# Patient Record
Sex: Male | Born: 1935 | Race: White | Hispanic: No | Smoking: Former smoker
Health system: Southern US, Community
[De-identification: ages and names within clinical notes are randomized; demographics above are authoritative.]

## PROBLEM LIST (undated history)

## (undated) DIAGNOSIS — M810 Age-related osteoporosis without current pathological fracture: Secondary | ICD-10-CM

## (undated) DIAGNOSIS — C801 Malignant (primary) neoplasm, unspecified: Secondary | ICD-10-CM

## (undated) DIAGNOSIS — E119 Type 2 diabetes mellitus without complications: Secondary | ICD-10-CM

## (undated) DIAGNOSIS — N189 Chronic kidney disease, unspecified: Secondary | ICD-10-CM

## (undated) DIAGNOSIS — Z9981 Dependence on supplemental oxygen: Secondary | ICD-10-CM

## (undated) DIAGNOSIS — K219 Gastro-esophageal reflux disease without esophagitis: Secondary | ICD-10-CM

## (undated) DIAGNOSIS — I10 Essential (primary) hypertension: Secondary | ICD-10-CM

## (undated) DIAGNOSIS — J189 Pneumonia, unspecified organism: Secondary | ICD-10-CM

## (undated) DIAGNOSIS — E7800 Pure hypercholesterolemia, unspecified: Secondary | ICD-10-CM

## (undated) DIAGNOSIS — R32 Unspecified urinary incontinence: Secondary | ICD-10-CM

## (undated) DIAGNOSIS — J449 Chronic obstructive pulmonary disease, unspecified: Secondary | ICD-10-CM

## (undated) DIAGNOSIS — I480 Paroxysmal atrial fibrillation: Secondary | ICD-10-CM

## (undated) DIAGNOSIS — K589 Irritable bowel syndrome without diarrhea: Secondary | ICD-10-CM

## (undated) DIAGNOSIS — R011 Cardiac murmur, unspecified: Secondary | ICD-10-CM

## (undated) HISTORY — DX: Age-related osteoporosis without current pathological fracture: M81.0

## (undated) HISTORY — DX: Pure hypercholesterolemia, unspecified: E78.00

## (undated) HISTORY — DX: Type 2 diabetes mellitus without complications: E11.9

## (undated) HISTORY — DX: Malignant (primary) neoplasm, unspecified: C80.1

## (undated) HISTORY — DX: Chronic obstructive pulmonary disease, unspecified: J44.9

## (undated) HISTORY — DX: Irritable bowel syndrome, unspecified: K58.9

## (undated) HISTORY — DX: Cardiac murmur, unspecified: R01.1

## (undated) HISTORY — DX: Unspecified urinary incontinence: R32

## (undated) HISTORY — PX: OTHER SURGICAL HISTORY: SHX169

---

## 1992-06-26 DIAGNOSIS — C801 Malignant (primary) neoplasm, unspecified: Secondary | ICD-10-CM

## 1992-06-26 HISTORY — DX: Malignant (primary) neoplasm, unspecified: C80.1

## 1992-06-26 HISTORY — PX: PROSTATE SURGERY: SHX751

## 1993-06-26 HISTORY — PX: OTHER SURGICAL HISTORY: SHX169

## 2004-01-11 ENCOUNTER — Other Ambulatory Visit: Payer: Self-pay

## 2004-03-26 ENCOUNTER — Ambulatory Visit: Payer: Self-pay | Admitting: Internal Medicine

## 2004-04-26 ENCOUNTER — Ambulatory Visit: Payer: Self-pay | Admitting: Internal Medicine

## 2004-05-26 ENCOUNTER — Ambulatory Visit: Payer: Self-pay | Admitting: Internal Medicine

## 2004-08-03 ENCOUNTER — Ambulatory Visit: Payer: Self-pay | Admitting: Internal Medicine

## 2004-08-24 ENCOUNTER — Ambulatory Visit: Payer: Self-pay | Admitting: Internal Medicine

## 2004-09-24 ENCOUNTER — Ambulatory Visit: Payer: Self-pay | Admitting: Internal Medicine

## 2004-11-30 ENCOUNTER — Ambulatory Visit: Payer: Self-pay | Admitting: Internal Medicine

## 2004-12-24 ENCOUNTER — Ambulatory Visit: Payer: Self-pay | Admitting: Internal Medicine

## 2005-01-24 ENCOUNTER — Ambulatory Visit: Payer: Self-pay | Admitting: Internal Medicine

## 2005-03-28 ENCOUNTER — Ambulatory Visit: Payer: Self-pay | Admitting: Internal Medicine

## 2005-04-26 ENCOUNTER — Ambulatory Visit: Payer: Self-pay | Admitting: Internal Medicine

## 2005-05-26 ENCOUNTER — Ambulatory Visit: Payer: Self-pay | Admitting: Internal Medicine

## 2005-07-20 ENCOUNTER — Ambulatory Visit: Payer: Self-pay | Admitting: Internal Medicine

## 2005-07-27 ENCOUNTER — Ambulatory Visit: Payer: Self-pay | Admitting: Internal Medicine

## 2005-08-24 ENCOUNTER — Ambulatory Visit: Payer: Self-pay | Admitting: Internal Medicine

## 2005-11-09 ENCOUNTER — Ambulatory Visit: Payer: Self-pay | Admitting: Internal Medicine

## 2005-11-24 ENCOUNTER — Ambulatory Visit: Payer: Self-pay | Admitting: Internal Medicine

## 2005-12-24 ENCOUNTER — Ambulatory Visit: Payer: Self-pay | Admitting: Internal Medicine

## 2006-01-24 ENCOUNTER — Ambulatory Visit: Payer: Self-pay | Admitting: Internal Medicine

## 2006-04-19 ENCOUNTER — Ambulatory Visit: Payer: Self-pay | Admitting: Internal Medicine

## 2006-04-26 ENCOUNTER — Ambulatory Visit: Payer: Self-pay | Admitting: Internal Medicine

## 2006-07-12 ENCOUNTER — Ambulatory Visit: Payer: Self-pay | Admitting: Internal Medicine

## 2006-07-27 ENCOUNTER — Ambulatory Visit: Payer: Self-pay | Admitting: Internal Medicine

## 2006-08-25 ENCOUNTER — Ambulatory Visit: Payer: Self-pay | Admitting: Internal Medicine

## 2006-10-23 ENCOUNTER — Ambulatory Visit: Payer: Self-pay | Admitting: General Surgery

## 2006-11-25 ENCOUNTER — Ambulatory Visit: Payer: Self-pay | Admitting: Internal Medicine

## 2006-12-07 ENCOUNTER — Ambulatory Visit: Payer: Self-pay | Admitting: Internal Medicine

## 2006-12-21 ENCOUNTER — Ambulatory Visit: Payer: Self-pay | Admitting: General Surgery

## 2006-12-25 ENCOUNTER — Ambulatory Visit: Payer: Self-pay | Admitting: Internal Medicine

## 2007-01-02 ENCOUNTER — Other Ambulatory Visit: Payer: Self-pay

## 2007-01-02 ENCOUNTER — Ambulatory Visit: Payer: Self-pay | Admitting: General Surgery

## 2007-01-08 ENCOUNTER — Ambulatory Visit: Payer: Self-pay | Admitting: General Surgery

## 2007-03-27 ENCOUNTER — Ambulatory Visit: Payer: Self-pay | Admitting: Internal Medicine

## 2007-04-05 ENCOUNTER — Ambulatory Visit: Payer: Self-pay | Admitting: Internal Medicine

## 2007-04-27 ENCOUNTER — Ambulatory Visit: Payer: Self-pay | Admitting: Internal Medicine

## 2007-05-10 ENCOUNTER — Ambulatory Visit: Payer: Self-pay | Admitting: Family Medicine

## 2007-07-28 ENCOUNTER — Ambulatory Visit: Payer: Self-pay | Admitting: Internal Medicine

## 2007-08-09 ENCOUNTER — Ambulatory Visit: Payer: Self-pay | Admitting: Internal Medicine

## 2007-08-25 ENCOUNTER — Ambulatory Visit: Payer: Self-pay | Admitting: Internal Medicine

## 2007-11-25 ENCOUNTER — Ambulatory Visit: Payer: Self-pay | Admitting: Internal Medicine

## 2007-12-06 ENCOUNTER — Ambulatory Visit: Payer: Self-pay | Admitting: Internal Medicine

## 2007-12-25 ENCOUNTER — Ambulatory Visit: Payer: Self-pay | Admitting: Internal Medicine

## 2008-03-26 ENCOUNTER — Ambulatory Visit: Payer: Self-pay | Admitting: Internal Medicine

## 2008-03-27 ENCOUNTER — Ambulatory Visit: Payer: Self-pay | Admitting: Internal Medicine

## 2008-04-26 ENCOUNTER — Ambulatory Visit: Payer: Self-pay | Admitting: Internal Medicine

## 2008-07-17 ENCOUNTER — Ambulatory Visit: Payer: Self-pay | Admitting: Internal Medicine

## 2008-07-27 ENCOUNTER — Ambulatory Visit: Payer: Self-pay | Admitting: Internal Medicine

## 2008-10-24 ENCOUNTER — Ambulatory Visit: Payer: Self-pay | Admitting: Internal Medicine

## 2008-11-05 ENCOUNTER — Ambulatory Visit: Payer: Self-pay | Admitting: Internal Medicine

## 2008-11-24 ENCOUNTER — Ambulatory Visit: Payer: Self-pay | Admitting: Internal Medicine

## 2008-12-31 ENCOUNTER — Ambulatory Visit: Payer: Self-pay | Admitting: Family Medicine

## 2009-01-29 ENCOUNTER — Ambulatory Visit: Payer: Self-pay

## 2009-02-24 ENCOUNTER — Ambulatory Visit: Payer: Self-pay | Admitting: Internal Medicine

## 2009-03-11 ENCOUNTER — Ambulatory Visit: Payer: Self-pay | Admitting: Internal Medicine

## 2009-03-26 ENCOUNTER — Ambulatory Visit: Payer: Self-pay | Admitting: Internal Medicine

## 2009-06-26 ENCOUNTER — Ambulatory Visit: Payer: Self-pay | Admitting: Internal Medicine

## 2009-06-26 HISTORY — PX: HERNIA REPAIR: SHX51

## 2009-06-26 HISTORY — PX: COLONOSCOPY: SHX174

## 2009-07-01 ENCOUNTER — Ambulatory Visit: Payer: Self-pay | Admitting: Internal Medicine

## 2009-07-27 ENCOUNTER — Ambulatory Visit: Payer: Self-pay | Admitting: Internal Medicine

## 2009-09-24 ENCOUNTER — Ambulatory Visit: Payer: Self-pay | Admitting: Internal Medicine

## 2009-10-21 ENCOUNTER — Ambulatory Visit: Payer: Self-pay | Admitting: Internal Medicine

## 2009-10-24 ENCOUNTER — Ambulatory Visit: Payer: Self-pay | Admitting: Internal Medicine

## 2009-11-26 ENCOUNTER — Ambulatory Visit: Payer: Self-pay | Admitting: General Surgery

## 2010-01-24 ENCOUNTER — Ambulatory Visit: Payer: Self-pay | Admitting: Internal Medicine

## 2010-02-17 ENCOUNTER — Ambulatory Visit: Payer: Self-pay | Admitting: Internal Medicine

## 2010-02-24 ENCOUNTER — Ambulatory Visit: Payer: Self-pay | Admitting: Internal Medicine

## 2010-03-18 ENCOUNTER — Ambulatory Visit: Payer: Self-pay | Admitting: Family Medicine

## 2010-06-09 ENCOUNTER — Ambulatory Visit: Payer: Self-pay | Admitting: Internal Medicine

## 2010-06-26 ENCOUNTER — Ambulatory Visit: Payer: Self-pay | Admitting: Internal Medicine

## 2010-08-10 ENCOUNTER — Ambulatory Visit: Payer: Self-pay | Admitting: Internal Medicine

## 2010-08-25 ENCOUNTER — Ambulatory Visit: Payer: Self-pay | Admitting: Internal Medicine

## 2010-10-10 ENCOUNTER — Ambulatory Visit: Payer: Self-pay | Admitting: Internal Medicine

## 2010-10-11 LAB — PSA

## 2010-10-25 ENCOUNTER — Ambulatory Visit: Payer: Self-pay | Admitting: Internal Medicine

## 2010-12-09 ENCOUNTER — Ambulatory Visit: Payer: Self-pay | Admitting: Internal Medicine

## 2010-12-25 ENCOUNTER — Ambulatory Visit: Payer: Self-pay | Admitting: Internal Medicine

## 2011-02-08 ENCOUNTER — Ambulatory Visit: Payer: Self-pay | Admitting: Internal Medicine

## 2011-02-25 ENCOUNTER — Ambulatory Visit: Payer: Self-pay | Admitting: Internal Medicine

## 2011-04-12 ENCOUNTER — Ambulatory Visit: Payer: Self-pay | Admitting: Internal Medicine

## 2011-04-13 LAB — PSA: PSA: <0.1 ng/mL (ref 0.0–4.0)

## 2011-04-27 ENCOUNTER — Ambulatory Visit: Payer: Self-pay | Admitting: Internal Medicine

## 2011-06-12 ENCOUNTER — Ambulatory Visit: Payer: Self-pay | Admitting: Internal Medicine

## 2011-06-13 LAB — PSA: PSA: <0.1 ng/mL (ref 0.0–4.0)

## 2011-06-27 ENCOUNTER — Ambulatory Visit: Payer: Self-pay | Admitting: Internal Medicine

## 2011-07-28 ENCOUNTER — Ambulatory Visit: Payer: Self-pay | Admitting: Internal Medicine

## 2011-08-11 ENCOUNTER — Ambulatory Visit: Payer: Self-pay | Admitting: Family Medicine

## 2011-10-09 ENCOUNTER — Ambulatory Visit: Payer: Self-pay | Admitting: Internal Medicine

## 2011-10-25 ENCOUNTER — Ambulatory Visit: Payer: Self-pay | Admitting: Internal Medicine

## 2011-12-11 ENCOUNTER — Ambulatory Visit: Payer: Self-pay | Admitting: Internal Medicine

## 2011-12-25 ENCOUNTER — Ambulatory Visit: Payer: Self-pay | Admitting: Internal Medicine

## 2012-01-25 ENCOUNTER — Ambulatory Visit: Payer: Self-pay

## 2012-01-25 ENCOUNTER — Ambulatory Visit: Payer: Self-pay | Admitting: Internal Medicine

## 2012-02-06 LAB — PSA: PSA: 0.6 ng/mL (ref 0.0–4.0)

## 2012-02-25 ENCOUNTER — Ambulatory Visit: Payer: Self-pay | Admitting: Internal Medicine

## 2012-03-07 LAB — PSA: PSA: 0.7 ng/mL (ref 0.0–4.0)

## 2012-03-26 ENCOUNTER — Ambulatory Visit: Payer: Self-pay | Admitting: Internal Medicine

## 2012-04-26 ENCOUNTER — Ambulatory Visit: Payer: Self-pay | Admitting: Internal Medicine

## 2012-05-26 ENCOUNTER — Ambulatory Visit: Payer: Self-pay | Admitting: Internal Medicine

## 2012-06-26 ENCOUNTER — Ambulatory Visit: Payer: Self-pay | Admitting: Internal Medicine

## 2012-07-01 LAB — HEPATIC FUNCTION PANEL A (ARMC)
Bilirubin, Direct: 0.1 mg/dL (ref 0.00–0.20)
Bilirubin,Total: 0.5 mg/dL (ref 0.2–1.0)
SGOT(AST): 19 U/L (ref 15–37)
SGPT (ALT): 20 U/L (ref 12–78)

## 2012-07-01 LAB — CBC CANCER CENTER
Basophil #: 0.1 x10 3/mm (ref 0.0–0.1)
Eosinophil #: 0.2 x10 3/mm (ref 0.0–0.7)
Eosinophil %: 2.5 %
HCT: 47 % (ref 40.0–52.0)
HGB: 15.6 g/dL (ref 13.0–18.0)
Lymphocyte #: 1 x10 3/mm (ref 1.0–3.6)
MCH: 29.7 pg (ref 26.0–34.0)
Monocyte #: 0.6 x10 3/mm (ref 0.2–1.0)
Neutrophil %: 70.4 %
RBC: 5.28 10*6/uL (ref 4.40–5.90)
RDW: 15.1 % — ABNORMAL HIGH (ref 11.5–14.5)
WBC: 6.2 x10 3/mm (ref 3.8–10.6)

## 2012-07-01 LAB — CREATININE, SERUM: EGFR (Non-African Amer.): 54 — ABNORMAL LOW

## 2012-07-02 LAB — PSA: PSA: 1.6 ng/mL (ref 0.0–4.0)

## 2012-07-27 ENCOUNTER — Ambulatory Visit: Payer: Self-pay | Admitting: Internal Medicine

## 2012-08-22 ENCOUNTER — Ambulatory Visit: Payer: Self-pay | Admitting: Family Medicine

## 2012-08-24 ENCOUNTER — Ambulatory Visit: Payer: Self-pay | Admitting: Internal Medicine

## 2012-09-24 ENCOUNTER — Ambulatory Visit: Payer: Self-pay | Admitting: Internal Medicine

## 2012-10-24 ENCOUNTER — Ambulatory Visit: Payer: Self-pay | Admitting: Internal Medicine

## 2012-10-28 ENCOUNTER — Encounter: Payer: Self-pay | Admitting: *Deleted

## 2012-11-24 ENCOUNTER — Ambulatory Visit: Payer: Self-pay | Admitting: Internal Medicine

## 2012-12-23 ENCOUNTER — Ambulatory Visit: Payer: Self-pay | Admitting: General Surgery

## 2013-01-08 ENCOUNTER — Encounter: Payer: Self-pay | Admitting: General Surgery

## 2013-01-08 ENCOUNTER — Ambulatory Visit (INDEPENDENT_AMBULATORY_CARE_PROVIDER_SITE_OTHER): Payer: Medicare Other | Admitting: General Surgery

## 2013-01-08 VITALS — BP 142/70 | HR 68 | Resp 16 | Ht 72.0 in | Wt 186.0 lb

## 2013-01-08 DIAGNOSIS — Z860100 Personal history of colon polyps, unspecified: Secondary | ICD-10-CM | POA: Insufficient documentation

## 2013-01-08 NOTE — Patient Instructions (Addendum)
Colonoscopy A colonoscopy is an exam to evaluate your entire colon. In this exam, your colon is cleansed. A long fiberoptic tube is inserted through your rectum and into your colon. The fiberoptic scope (endoscope) is a long bundle of enclosed and very flexible fibers. These fibers transmit light to the area examined and send images from that area to your caregiver. Discomfort is usually minimal. You may be given a drug to help you sleep (sedative) during or prior to the procedure. This exam helps to detect lumps (tumors), polyps, inflammation, and areas of bleeding. Your caregiver may also take a small piece of tissue (biopsy) that will be examined under a microscope. LET YOUR CAREGIVER KNOW ABOUT:   Allergies to food or medicine.  Medicines taken, including vitamins, herbs, eyedrops, over-the-counter medicines, and creams.  Use of steroids (by mouth or creams).  Previous problems with anesthetics or numbing medicines.  History of bleeding problems or blood clots.  Previous surgery.  Other health problems, including diabetes and kidney problems.  Possibility of pregnancy, if this applies. BEFORE THE PROCEDURE   A clear liquid diet may be required for 2 days before the exam.  Ask your caregiver about changing or stopping your regular medications.  Liquid injections (enemas) or laxatives may be required.  A large amount of electrolyte solution may be given to you to drink over a short period of time. This solution is used to clean out your colon.  You should be present 60 minutes prior to your procedure or as directed by your caregiver. AFTER THE PROCEDURE   If you received a sedative or pain relieving medication, you will need to arrange for someone to drive you home.  Occasionally, there is a little blood passed with the first bowel movement. Do not be concerned. FINDING OUT THE RESULTS OF YOUR TEST Not all test results are available during your visit. If your test results are  not back during the visit, make an appointment with your caregiver to find out the results. Do not assume everything is normal if you have not heard from your caregiver or the medical facility. It is important for you to follow up on all of your test results. HOME CARE INSTRUCTIONS   It is not unusual to pass moderate amounts of gas and experience mild abdominal cramping following the procedure. This is due to air being used to inflate your colon during the exam. Walking or a warm pack on your belly (abdomen) may help.  You may resume all normal meals and activities after sedatives and medicines have worn off.  Only take over-the-counter or prescription medicines for pain, discomfort, or fever as directed by your caregiver. Do not use aspirin or blood thinners if a biopsy was taken. Consult your caregiver for medicine usage if biopsies were taken. SEEK IMMEDIATE MEDICAL CARE IF:   You have a fever.  You pass large blood clots or fill a toilet with blood following the procedure. This may also occur 10 to 14 days following the procedure. This is more likely if a biopsy was taken.  You develop abdominal pain that keeps getting worse and cannot be relieved with medicine. Document Released: 06/09/2000 Document Revised: 09/04/2011 Document Reviewed: 01/23/2008 [facility] Patient Information 2014 [place], [place].  Patient will be contacted once September 2014 scheduled is available to schedule colonoscopy. This patient has been asked to hold metformin day of colonoscopy prep and procedure. He will also discontinue fish oil one week prior.

## 2013-01-08 NOTE — Progress Notes (Addendum)
Patient ID: Alan Weaver, male   DOB: [birth year], 79 y.o.   MRN: [id number]  Chief Complaint  Patient presents with  . Pre-op Exam    colonoscopy    HPI Alan Weaver is a 79 y.o. male who presents for a pre-op examination for a colonoscopy. His last colonscopy was done in 2011 in which polyps were present.  HPI  Past Medical History  Diagnosis Date  . Heart murmur   . COPD (chronic obstructive pulmonary disease)   . High blood cholesterol level   . Irritable bowel syndrome   . Cancer 1994    prostate  . Diabetes mellitus without complication   . Osteoporosis   . Incontinence   . COPD (chronic obstructive pulmonary disease) 2000    Past Surgical History  Procedure Laterality Date  . Intraocular lens insertion  2002  . Colonoscopy    . Prostate surgery  1994  . Hernia repair  2011  . Surgery for staph infection  1995  . Colonoscopy  2011    Family History  Problem Relation Age of Onset  . Cancer Father     bladder  . Other Sister     brain tumor  . Cancer Brother     colon    Social History History  Substance Use Topics  . Smoking status: Never Smoker   . Smokeless tobacco: Never Used  . Alcohol Use: No    No Known Allergies  Current Outpatient Prescriptions  Medication Sig Dispense Refill  . ALBUTEROL SULFATE HFA IN Inhale 1 puff into the lungs 4 (four) times daily.      [name] alendronate (FOSAMAX) 70 MG tablet Take 70 mg by mouth every 7 (seven) days. Take with a full glass of water on an empty stomach.      . Calcium Carbonate-Vitamin D (CALCIUM + D PO) Take 600 mg by mouth 2 (two) times daily.      . Cholecalciferol (VITAMIN D-3 PO) Take 1 tablet by mouth 2 (two) times daily.      . Fluticasone-Salmeterol (ADVAIR DISKUS) 250-50 MCG/DOSE AEPB Inhale 1 puff into the lungs 2 (two) times daily.      . metFORMIN (GLUCOPHAGE) 500 MG tablet Take 500 mg by mouth daily.      . Omega-3 Fatty Acids (FISH OIL) 1200 MG CAPS Take 1 capsule by mouth 2 (two) times  daily.      . pravastatin (PRAVACHOL) 40 MG tablet Take 40 mg by mouth daily.      [name] tiotropium (SPIRIVA) 18 MCG inhalation capsule Place 18 mcg into inhaler and inhale daily.       No current facility-administered medications for this visit.    Review of Systems Review of Systems  Constitutional: Negative.   Respiratory: Positive for shortness of breath. Negative for apnea, cough, choking, chest tightness, wheezing and stridor.   Cardiovascular: Negative.   Gastrointestinal: Positive for constipation. Negative for nausea, vomiting, abdominal pain, diarrhea, blood in stool, abdominal distention, anal bleeding and rectal pain.    Blood pressure 142/70, pulse 68, resp. rate 16, height 6' (1.829 m), weight 186 lb (84.369 kg), SpO2 95.00%.  Physical Exam Physical Exam  Constitutional: He is oriented to person, place, and time. He appears well-developed and well-nourished.  Cardiovascular: Normal rate, regular rhythm and normal heart sounds.   Pulmonary/Chest: Effort normal and breath sounds normal.  Neurological: He is alert and oriented to person, place, and time.  Skin: Skin is warm and dry.    Data Reviewed  Allergy from the 11/26/2009 colonoscopy showed tubular adenomas in the ascending colon, hepatic flexure, transverse colon and descending colon. High-grade dysplasia in any of the lesions.  Assessment    Multiple colonic polyps.    Plan    Based on his excellent health, and the finding of multiple polyps throughout the colon, repeat colonoscopy has been recommended.    Patient will be contacted once September 2014 scheduled is available to schedule colonoscopy. This patient has been asked to hold metformin day of colonoscopy prep and procedure. He will also discontinue fish oil one week prior. Since he is diabetic, we will try to accommodate patient and have colonoscopy completed on a Wednesday morning. Miralax prescription will be sent to patient's pharmacy once date is  arranged.   Alan Weaver 01/10/2013, 6:39 PM

## 2013-01-24 ENCOUNTER — Ambulatory Visit: Payer: Self-pay | Admitting: Internal Medicine

## 2013-01-27 ENCOUNTER — Telehealth: Payer: Self-pay | Admitting: *Deleted

## 2013-01-27 DIAGNOSIS — Z86010 Personal history of colonic polyps: Secondary | ICD-10-CM

## 2013-01-27 MED ORDER — POLYETHYLENE GLYCOL 3350 17 GM/SCOOP PO POWD: ORAL | Status: DC

## 2013-01-27 NOTE — Telephone Encounter (Signed)
Patient has been scheduled for a colonoscopy at [facility] on 03-12-13. This patient has been asked to hold metformin day of colonoscopy prep and procedure. Also, patient has been asked to discontinue fish oil one week prior to procedure.  Miralax prescription has been sent to patient's pharmacy.   He will be contacted prior to colonoscopy to verify no medication changes.

## 2013-02-06 LAB — PSA: PSA: <0.1 ng/mL (ref 0.0–4.0)

## 2013-02-24 ENCOUNTER — Ambulatory Visit: Payer: Self-pay | Admitting: Internal Medicine

## 2013-03-05 ENCOUNTER — Telehealth: Payer: Self-pay | Admitting: *Deleted

## 2013-03-05 NOTE — Telephone Encounter (Signed)
Patient reports he is now on Lupron injections. This will be added to medication list. He reports no other changes to medications. Patient was instructed to pre-register since he has not done so already. We will proceed with colonoscopy that is scheduled for 03-12-13 at [facility]. He will call the office if he has any other questions.

## 2013-03-09 ENCOUNTER — Other Ambulatory Visit: Payer: Self-pay | Admitting: General Surgery

## 2013-03-09 DIAGNOSIS — Z86010 Personal history of colonic polyps: Secondary | ICD-10-CM

## 2013-03-12 ENCOUNTER — Ambulatory Visit: Payer: Self-pay | Admitting: General Surgery

## 2013-03-12 DIAGNOSIS — D128 Benign neoplasm of rectum: Secondary | ICD-10-CM

## 2013-03-12 DIAGNOSIS — D129 Benign neoplasm of anus and anal canal: Secondary | ICD-10-CM

## 2013-03-12 HISTORY — PX: COLONOSCOPY: SHX174

## 2013-03-13 ENCOUNTER — Encounter: Payer: Self-pay | Admitting: General Surgery

## 2013-03-17 ENCOUNTER — Encounter: Payer: Self-pay | Admitting: General Surgery

## 2013-03-18 ENCOUNTER — Telehealth: Payer: Self-pay | Admitting: *Deleted

## 2013-03-18 NOTE — Telephone Encounter (Signed)
Notified patient as instructed, patient pleased. Discussed follow-up appointments being on an as needed basis, patient agrees

## 2013-03-18 NOTE — Telephone Encounter (Signed)
Message copied by Currie Paris on Tue Mar 18, 2013  8:07 AM ------      Message from: [place], [place] W      Created: Mon Mar 17, 2013  1:49 PM       Please notify the patient that no cancer.  Polyps were OK, no further studies unless symptoms develop.       ----- Message -----         From: Jena Gauss, CMA         Sent: 03/17/2013  10:30 AM           To: Earline Mayotte, MD                   ------

## 2013-03-26 ENCOUNTER — Ambulatory Visit: Payer: Self-pay | Admitting: Internal Medicine

## 2013-04-01 ENCOUNTER — Encounter: Payer: Self-pay | Admitting: General Surgery

## 2013-06-07 LAB — BASIC METABOLIC PANEL
Anion Gap: 7 (ref 7–16)
Co2: 30 mmol/L (ref 21–32)
Creatinine: 1.21 mg/dL (ref 0.60–1.30)
EGFR (Non-African Amer.): 55 — ABNORMAL LOW
Glucose: 221 mg/dL — ABNORMAL HIGH (ref 65–99)
Osmolality: 291 (ref 275–301)
Sodium: 140 mmol/L (ref 136–145)

## 2013-06-07 LAB — CBC
HGB: 13.7 g/dL (ref 13.0–18.0)
MCH: 28.8 pg (ref 26.0–34.0)
MCHC: 32.2 g/dL (ref 32.0–36.0)
MCV: 90 fL (ref 80–100)
RBC: 4.76 10*6/uL (ref 4.40–5.90)
WBC: 9.2 10*3/uL (ref 3.8–10.6)

## 2013-06-07 LAB — TROPONIN I: Troponin-I: 0.05 ng/mL

## 2013-06-08 ENCOUNTER — Observation Stay: Payer: Self-pay | Admitting: Internal Medicine

## 2013-06-08 LAB — TROPONIN I: Troponin-I: 0.07 ng/mL — ABNORMAL HIGH

## 2013-06-08 LAB — CK TOTAL AND CKMB (NOT AT ARMC): CK-MB: 1.4 ng/mL (ref 0.5–3.6)

## 2013-07-09 ENCOUNTER — Ambulatory Visit: Payer: Self-pay | Admitting: Internal Medicine

## 2013-07-10 LAB — PSA

## 2013-07-27 ENCOUNTER — Ambulatory Visit: Payer: Self-pay | Admitting: Internal Medicine

## 2013-10-30 ENCOUNTER — Ambulatory Visit: Payer: Self-pay | Admitting: Internal Medicine

## 2013-11-06 LAB — PSA: PSA: <0.1 ng/mL (ref 0.0–4.0)

## 2013-11-24 ENCOUNTER — Ambulatory Visit: Payer: Self-pay | Admitting: Internal Medicine

## 2014-03-09 ENCOUNTER — Ambulatory Visit: Payer: Self-pay | Admitting: Internal Medicine

## 2014-03-10 LAB — PSA: PSA: <0.1 ng/mL (ref 0.0–4.0)

## 2014-03-26 ENCOUNTER — Ambulatory Visit: Payer: Self-pay | Admitting: Internal Medicine

## 2014-07-09 ENCOUNTER — Ambulatory Visit: Payer: Self-pay | Admitting: Internal Medicine

## 2014-07-13 LAB — PSA: PSA: 0.1 ng/mL (ref 0.0–4.0)

## 2014-07-27 ENCOUNTER — Ambulatory Visit: Payer: Self-pay | Admitting: Internal Medicine

## 2014-10-09 ENCOUNTER — Ambulatory Visit
Admit: 2014-10-09 | Disposition: A | Discharge status: Home / Self Care | Payer: Self-pay | Attending: Internal Medicine | Admitting: Internal Medicine

## 2014-10-10 LAB — PSA: PSA: 0.1 ng/mL (ref 0.0–4.0)

## 2014-10-16 NOTE — Discharge Summary (Signed)
PATIENT NAME:  Alan Weaver, Alan Weaver MR#:  [id number] DATE OF BIRTH:  07/06/1926  DATE OF ADMISSION:  06/08/2013 DATE OF DISCHARGE:  06/08/2013  PRESENTING COMPLAINT: Chest discomfort.   DISCHARGE DIAGNOSES:  1.  Chest discomfort suspected due to vomiting and possible mild acute gastritis, resolved.  2.  Hypertension.  3.  Type 2 diabetes.  4.  Chronic obstructive pulmonary disease.   CODE STATUS: Full code.   MEDICATIONS:  1.  Metformin 500 mg p.o. daily.  2.  Advair Diskus 250/50 mg 1 puff b.i.d.  3.  Pravastatin 40 mg daily at bedtime.  4.  Calcium with vitamin D 1 tablet b.i.d.  5.  Alendronate 70 mg once a week on Sunday.  6.  Fish oil 1200 mg 2 capsules daily.  7.  Proventil 1 puff as needed for shortness of breath. 8.  Spiriva 18 mcg inhalation daily.  9.  Vitamin D3 1000 international units 2 tablets daily.  10. Lupron intramuscular every 4 months as directed.   DIET: Mechanical soft diet.   FOLLOWUP: With your primary care physician, Dr. Lelon Huh in 1 to 2 weeks.   LABORATORY DATA AT DISCHARGE: Troponin is 0.05, 0.07, and 0.07. Echo of the heart shows EF is 50% to 55%, mild mitral valve regurgitation and mild tricuspid regurgitation. CK total and CK-MB within normal limits. CBC within normal limits. Basic metabolic panel within normal limits except glucose of 221. Chest x-ray: Severe COPD, no active lung disease.  BRIEF SUMMARY AND HOSPIAL COURSE: Mr. Haycraft is a pleasant 81 year old Caucasian gentleman, who came to the Emergency Room after he choked on a [place] sandwich he ate in the evening as part of his meal. The patient was admitted with:  1.  Chest discomfort, which is probably from dysphagia from eating the [place] sandwich and reactive chest pain after vomiting and possible mild gastritis. His troponin was 0.07, 0.05 and 0.07. He did not have any chest pain. No known cardiac history. He remained in sinus rhythm on telemetry. We held off cardiology consultation, the  patient was asymptomatic. 2.  Dysphagia from choking on a [place] sandwich. The patient felt back to baseline after he vomited. He did not have any more foreign body sensation. GI consult was canceled. I spoke with Dr. Candace Cruise, who is okay with it. The patient tolerated soft diet prior to discharge.  3.  COPD, remained stable.  4.  Type 2 diabetes. Resumed home meds. 5.   Hospital stay otherwise remained stable.   CODE STATUS: The patient remained a full code.   TIME SPENT: 40 minutes.  ____________________________ Hart Rochester Posey Pronto, MD sap:aw D: 06/09/2013 06:42:37 ET T: 06/09/2013 07:02:38 ET JOB#: [id number]  cc: Serjio Deupree A. Posey Pronto, MD, <Dictator> Kirstie Peri. Caryn Section, MD Ilda Basset MD ELECTRONICALLY SIGNED 06/12/2013 10:56

## 2014-10-16 NOTE — H&P (Signed)
PATIENT NAME:  Alan Weaver, Alan Weaver MR#:  [id number] DATE OF BIRTH:  [birth year]  DATE OF ADMISSION:  06/08/2013  PRIMARY CARE PHYSICIAN: Dr. Lelon Huh.   ONCOLOGIST: Dr. Oliva Bustard.   REFERRING MD:  Dr. Owens Shark.   CHIEF COMPLAINT: Chest discomfort and difficulty in swallowing.   HISTORY OF PRESENT ILLNESS: The patient is an 81 year old Caucasian male with past medical history of diabetes mellitus, prostate cancer, COPD, hyperlipidemia,  and osteoporosis, came into the ER after he choked on his food. The patient is reporting that at around 6:00 p.m. he choked on his food while he was eating his supper. He try to cough it up from 6:00 p.m. to 9:00 p.m. and was not successful. The patient came to the ER and he was supposed to go to EGD, but then he drank milk and he was able to swallow liquids. His nausea and vomiting were resolved. The patient was having chest discomfort, regarding which cardiac enzymes were done. The initial troponin has revealed 0.05 and subsequent troponin was elevated at 0.07. Hospitalist team is called to admit the patient for chest discomfort and dysphagia.   During my examination, the patient is resting comfortably. Denies any chest pain or shortness of breath. He has reported that after drinking milk he started feeling better and he was able to swallow liquids without any difficulty. He did not try taking any solid foods so far. Denies any shortness of breath, abdominal pain. No similar complaints in the past. Family members daughter and granddaughter were at bedside. Denies any heart attacks in the past.  See oncology as an outpatient regarding his prostate cancer.   PAST MEDICAL HISTORY: Prostate cancer status post radiation therapy and now he is getting Lupron treatments on regular basis. Diabetes mellitus, chronic obstructive pulmonary disease, hyperlipidemia, cardiac history ,  osteoporosis.   PAST SURGICAL HISTORY: Two hernia repairs, prostatectomy.   ALLERGIES: No known  drug allergies.   PSYCHOSOCIAL HISTORY: Lives at home with pets. Quit smoking five years ago. Denies alcohol or illicit drug usage.   FAMILY HISTORY: Mother had history of osteoporosis and colon cancer.  [name]  HOME MEDICATIONS: Vitamin D3 2 tablets p.o. once daily, Spiriva 18 mcg puffs inhalation 1 capsule once daily, Proventil 2 puff inhalation as needed basis, alendronate 70 mg p.o. once a week, Advair 250/50, 1 puff inhalation 2 times a day,  metformin 500 mg once daily.   REVIEW OF SYSTEMS:  CONSTITUTIONAL: Denies any fever, fatigue.  EYES: Denies blurry vision, glaucoma.  ENT: Denies epistaxis, discharge.  RESPIRATION: Denies cough, COPD.  CARDIOVASCULAR: Chest discomfort trying to swallow. Denies any palpitations.  GASTROINTESTINAL: Vomited after he choked on the food, but denies any diarrhea, abdominal pain, hematemesis.  GENITOURINARY: No dysuria, hematuria. Has prostate cancer.  ENDOCRINE: Denies polyuria, nocturia, thyroid problems. Has history of diabetes mellitus. HEMATOLOGIC AND LYMPHATIC: Denies anemia, easy bruising, bleeding.  INTEGUMENTARY: No acne, rash, lesions.  MUSCULOSKELETAL: No joint pain in the neck and back. Denies any gout.  NEUROLOGIC: Denies vertigo, ataxia.  PSYCHIATRIC: No ADD, OCD.   PHYSICAL EXAMINATION: VITAL SIGNS: The patient is afebrile, pulse 90, respirations 18, blood pressure 151/95, pulse oximetry is 92%.  GENERAL APPEARANCE: Not in acute distress. Moderately built and obese.  HEENT: Normocephalic, atraumatic. Pupils are reactive to light accommodation. No scleral icterus. No conjunctival injection. Extraocular movements are intact. Nares are patent, moist mucous membranes. Uvula is midline. Oral cavity is intact with no visible foreign bodies.  NECK: Supple. No JVD. No thyromegaly. Range  of motion is intact. No carotid bruits.  LUNGS: Clear to auscultation bilaterally. No accessory muscle use and no anterior chest wall tenderness on palpation.   CARDIAC: S1, S2 normal. Regular rate and rhythm. No murmurs.   GASTROINTESTINAL: Soft. Bowel sounds are positive in all four quadrants. Nontender, nondistended. No hepatosplenomegaly. No masses felt.  NEUROLOGIC: Awake, alert, oriented x 3. Cranial nerves II through XII are grossly intact. Motor and sensory are intact. Reflexes are 2+.  EXTREMITIES: No edema. No cyanosis. No clubbing.  SKIN: Warm to touch. Normal turgor. No rashes. No lesions. Peripheral pulses are 2+. PSYCHIATRIC: Normal mood and affect.   LABORATORY AND IMAGING STUDIES: CK total 45, CPK-MB 1.4. Troponin 0.05, 0.07, 0.07. CBC is normal. BMP is normal except BUN is elevated at 27. GFR is 55, glucose 221. Twelve-lead EKG: Normal sinus tachycardia at 99 beats per minute, normal PR and QRS interval. Nonspecific ST-T wave changes. Chest x-ray PA and lateral shows COPD, no active lung disease, possible small hiatal hernia.   ASSESSMENT AND PLAN: An 81 year old Caucasian male presenting to the ER after he choked on food and having difficulty in swallowing associated with vomiting, will be admitted with following assessment and plan.  1. Chest discomfort, probably from dysphagia from foreign body, but rule out acute myocardial infarction. We will admit the patient to telemetry, acute coronary syndrome protocol. Cyclic cardiac biomarkers, clear liquids, provide IV fluids. We will obtain echocardiogram, cycle cardiac biomarkers. Cardiology consult is placed to Dr. Saralyn Pilar.  2. Dysphagia from foreign body.  The patient was able to swallow liquids. We will put him on clear liquid diet. Nausea and vomiting were resolved. Gastroenterology consult is placed regarding dysphagia,  3. Chronic obstructive pulmonary disease- No exacerbation. We will provide nebulizer treatments as needed basis.  4. Diabetes mellitus. The patient being n.p.o., just on clear liquids. We will  keep the patient on sliding scale insulin.  5. Prostate cancer. Follow up  with oncology as an outpatient for Lupron treatments as scheduled.  6. We will provide gastrointestinal and deep vein thrombosis prophylaxis.   The patient is FULL CODE. Daughter is the medical power of attorney. Diagnosis and plan of care was discussed in detail with the patient and daughter  at bedside. They all verbalized understanding of the plan.   TOTAL TIME SPENT ON ADMISSION: 45 minutes.    ____________________________ Nicholes Mango, MD ag:sg D: 06/08/2013 07:47:53 ET T: 06/08/2013 10:17:32 ET JOB#: [id number]  cc: Nicholes Mango, MD, <Dictator> Isaias Cowman, MD Kirstie Peri. Caryn Section, MD [place]ia Oliva Bustard, MD  Nicholes Mango MD ELECTRONICALLY SIGNED 06/18/2013 16:53

## 2014-11-22 ENCOUNTER — Emergency Department: Payer: Commercial Managed Care - HMO

## 2014-11-22 ENCOUNTER — Inpatient Hospital Stay
Admission: EM | Admit: 2014-11-22 | Discharge: 2014-11-25 | DRG: 871 | Disposition: A | Discharge status: Home / Self Care | Payer: Commercial Managed Care - HMO | Attending: Internal Medicine | Admitting: Internal Medicine

## 2014-11-22 ENCOUNTER — Encounter: Payer: Self-pay | Admitting: Emergency Medicine

## 2014-11-22 DIAGNOSIS — J189 Pneumonia, unspecified organism: Secondary | ICD-10-CM | POA: Diagnosis present

## 2014-11-22 DIAGNOSIS — Z87891 Personal history of nicotine dependence: Secondary | ICD-10-CM | POA: Diagnosis not present

## 2014-11-22 DIAGNOSIS — Z8546 Personal history of malignant neoplasm of prostate: Secondary | ICD-10-CM

## 2014-11-22 DIAGNOSIS — J9601 Acute respiratory failure with hypoxia: Secondary | ICD-10-CM | POA: Diagnosis present

## 2014-11-22 DIAGNOSIS — Z800 Family history of malignant neoplasm of digestive organs: Secondary | ICD-10-CM | POA: Diagnosis not present

## 2014-11-22 DIAGNOSIS — E119 Type 2 diabetes mellitus without complications: Secondary | ICD-10-CM | POA: Diagnosis present

## 2014-11-22 DIAGNOSIS — I10 Essential (primary) hypertension: Secondary | ICD-10-CM | POA: Diagnosis present

## 2014-11-22 DIAGNOSIS — R000 Tachycardia, unspecified: Secondary | ICD-10-CM | POA: Diagnosis present

## 2014-11-22 DIAGNOSIS — R0602 Shortness of breath: Secondary | ICD-10-CM | POA: Diagnosis present

## 2014-11-22 DIAGNOSIS — Z79899 Other long term (current) drug therapy: Secondary | ICD-10-CM

## 2014-11-22 DIAGNOSIS — K589 Irritable bowel syndrome without diarrhea: Secondary | ICD-10-CM | POA: Diagnosis present

## 2014-11-22 DIAGNOSIS — A419 Sepsis, unspecified organism: Secondary | ICD-10-CM | POA: Diagnosis present

## 2014-11-22 DIAGNOSIS — Z7951 Long term (current) use of inhaled steroids: Secondary | ICD-10-CM | POA: Diagnosis not present

## 2014-11-22 DIAGNOSIS — R011 Cardiac murmur, unspecified: Secondary | ICD-10-CM | POA: Diagnosis present

## 2014-11-22 DIAGNOSIS — M810 Age-related osteoporosis without current pathological fracture: Secondary | ICD-10-CM | POA: Diagnosis present

## 2014-11-22 DIAGNOSIS — Z9889 Other specified postprocedural states: Secondary | ICD-10-CM | POA: Diagnosis not present

## 2014-11-22 DIAGNOSIS — J441 Chronic obstructive pulmonary disease with (acute) exacerbation: Secondary | ICD-10-CM | POA: Diagnosis present

## 2014-11-22 DIAGNOSIS — Z8052 Family history of malignant neoplasm of bladder: Secondary | ICD-10-CM

## 2014-11-22 LAB — TROPONIN I: Troponin I: 0.03 ng/mL (ref <0.031)

## 2014-11-22 LAB — COMPREHENSIVE METABOLIC PANEL
ALT: 11 U/L — AB (ref 17–63)
ANION GAP: 11 (ref 5–15)
AST: 18 U/L (ref 15–41)
Albumin: 3.6 g/dL (ref 3.5–5.0)
Alkaline Phosphatase: 60 U/L (ref 38–126)
BILIRUBIN TOTAL: 0.9 mg/dL (ref 0.3–1.2)
BUN: 23 mg/dL — ABNORMAL HIGH (ref 6–20)
CO2: 27 mmol/L (ref 22–32)
Calcium: 8.9 mg/dL (ref 8.9–10.3)
Chloride: 100 mmol/L — ABNORMAL LOW (ref 101–111)
Creatinine, Ser: 1.27 mg/dL — ABNORMAL HIGH (ref 0.61–1.24)
GFR calc Af Amer: 57 mL/min — ABNORMAL LOW (ref >60)
GFR, EST NON AFRICAN AMERICAN: 49 mL/min — AB (ref >60)
GLUCOSE: 172 mg/dL — AB (ref 65–99)
Potassium: 4.3 mmol/L (ref 3.5–5.1)
SODIUM: 138 mmol/L (ref 135–145)
Total Protein: 8 g/dL (ref 6.5–8.1)

## 2014-11-22 LAB — CBC WITH DIFFERENTIAL/PLATELET
BASOS ABS: 0.1 10*3/uL (ref 0–0.1)
Basophils Relative: 0 %
Eosinophils Absolute: 0 10*3/uL (ref 0–0.7)
Eosinophils Relative: 0 %
HCT: 45.7 % (ref 40.0–52.0)
Hemoglobin: 14.8 g/dL (ref 13.0–18.0)
LYMPHS PCT: 10 %
Lymphs Abs: 1.4 10*3/uL (ref 1.0–3.6)
MCH: 29 pg (ref 26.0–34.0)
MCHC: 32.5 g/dL (ref 32.0–36.0)
MCV: 89.2 fL (ref 80.0–100.0)
MONOS PCT: 7 %
Monocytes Absolute: 0.9 10*3/uL (ref 0.2–1.0)
NEUTROS ABS: 11.3 10*3/uL — AB (ref 1.4–6.5)
NEUTROS PCT: 83 %
PLATELETS: 286 10*3/uL (ref 150–440)
RBC: 5.12 MIL/uL (ref 4.40–5.90)
RDW: 15.5 % — ABNORMAL HIGH (ref 11.5–14.5)
WBC: 13.7 10*3/uL — ABNORMAL HIGH (ref 3.8–10.6)

## 2014-11-22 LAB — BRAIN NATRIURETIC PEPTIDE: B Natriuretic Peptide: 213 pg/mL — ABNORMAL HIGH (ref 0.0–100.0)

## 2014-11-22 LAB — GLUCOSE, CAPILLARY
GLUCOSE-CAPILLARY: 354 mg/dL — AB (ref 65–99)
GLUCOSE-CAPILLARY: 283 mg/dL — AB (ref 65–99)
Glucose-Capillary: 219 mg/dL — ABNORMAL HIGH (ref 65–99)

## 2014-11-22 LAB — MAGNESIUM: Magnesium: 1.6 mg/dL — ABNORMAL LOW (ref 1.7–2.4)

## 2014-11-22 LAB — HEMOGLOBIN A1C: Hgb A1c MFr Bld: 6.4 % — ABNORMAL HIGH (ref 4.0–6.0)

## 2014-11-22 MED ORDER — DOCUSATE SODIUM 100 MG PO CAPS: 100.0000 mg | ORAL_CAPSULE | Freq: Two times a day (BID) | ORAL | Status: DC | PRN

## 2014-11-22 MED ORDER — MAGNESIUM SULFATE 2 GM/50ML IV SOLN
2.0000 g | Freq: Once | INTRAVENOUS | Status: AC
  Administered 2014-11-22: 2 g via INTRAVENOUS

## 2014-11-22 MED ORDER — INSULIN ASPART 100 UNIT/ML ~~LOC~~ SOLN
0.0000 [IU] | Freq: Three times a day (TID) | SUBCUTANEOUS | Status: DC
  Administered 2014-11-22: 9 [IU] via SUBCUTANEOUS
  Administered 2014-11-23 (×2): 3 [IU] via SUBCUTANEOUS
  Administered 2014-11-23: 2 [IU] via SUBCUTANEOUS
  Administered 2014-11-24: 3 [IU] via SUBCUTANEOUS
  Administered 2014-11-24: 1 [IU] via SUBCUTANEOUS
  Administered 2014-11-24: 2 [IU] via SUBCUTANEOUS
  Administered 2014-11-25: 1 [IU] via SUBCUTANEOUS
  Filled 2014-11-22: qty 3
  Filled 2014-11-22: qty 2
  Filled 2014-11-22: qty 3
  Filled 2014-11-22: qty 9
  Filled 2014-11-22: qty 3

## 2014-11-22 MED ORDER — PRAVASTATIN SODIUM 20 MG PO TABS
40.0000 mg | ORAL_TABLET | Freq: Every day | ORAL | Status: DC
  Administered 2014-11-22 – 2014-11-25 (×4): 40 mg via ORAL
  Filled 2014-11-22 (×4): qty 2

## 2014-11-22 MED ORDER — MOMETASONE FURO-FORMOTEROL FUM 100-5 MCG/ACT IN AERO
2.0000 | INHALATION_SPRAY | Freq: Two times a day (BID) | RESPIRATORY_TRACT | Status: DC
  Administered 2014-11-22 – 2014-11-25 (×7): 2 via RESPIRATORY_TRACT
  Filled 2014-11-22: qty 8.8

## 2014-11-22 MED ORDER — METHYLPREDNISOLONE SODIUM SUCC 125 MG IJ SOLR
60.0000 mg | Freq: Three times a day (TID) | INTRAMUSCULAR | Status: DC
  Administered 2014-11-22: 60 mg via INTRAVENOUS
  Administered 2014-11-22 – 2014-11-23 (×2): 125 mg via INTRAVENOUS
  Filled 2014-11-22 (×3): qty 2

## 2014-11-22 MED ORDER — CALCIUM CITRATE-VITAMIN D 315-200 MG-UNIT PO TABS: 1.0000 | ORAL_TABLET | Freq: Two times a day (BID) | ORAL | Status: DC

## 2014-11-22 MED ORDER — LEVOFLOXACIN IN D5W 750 MG/150ML IV SOLN
750.0000 mg | Freq: Once | INTRAVENOUS | Status: AC
  Administered 2014-11-22: 750 mg via INTRAVENOUS

## 2014-11-22 MED ORDER — HEPARIN SODIUM (PORCINE) 5000 UNIT/ML IJ SOLN
5000.0000 [IU] | Freq: Three times a day (TID) | INTRAMUSCULAR | Status: DC
  Administered 2014-11-22 – 2014-11-25 (×9): 5000 [IU] via SUBCUTANEOUS
  Filled 2014-11-22 (×9): qty 1

## 2014-11-22 MED ORDER — INSULIN ASPART 100 UNIT/ML ~~LOC~~ SOLN
0.0000 [IU] | Freq: Every day | SUBCUTANEOUS | Status: DC
  Administered 2014-11-22: 100 [IU] via SUBCUTANEOUS
  Administered 2014-11-23: 2 [IU] via SUBCUTANEOUS
  Filled 2014-11-22: qty 3
  Filled 2014-11-22: qty 2
  Filled 2014-11-22 (×2): qty 1
  Filled 2014-11-22: qty 2

## 2014-11-22 MED ORDER — TIOTROPIUM BROMIDE MONOHYDRATE 18 MCG IN CAPS
18.0000 ug | ORAL_CAPSULE | Freq: Every day | RESPIRATORY_TRACT | Status: DC
  Administered 2014-11-22 – 2014-11-25 (×4): 18 ug via RESPIRATORY_TRACT
  Filled 2014-11-22: qty 5

## 2014-11-22 MED ORDER — ALENDRONATE SODIUM 70 MG PO TABS: 70.0000 mg | ORAL_TABLET | ORAL | Status: DC

## 2014-11-22 MED ORDER — CALCIUM CARBONATE-VITAMIN D 500-200 MG-UNIT PO TABS
1.0000 | ORAL_TABLET | Freq: Two times a day (BID) | ORAL | Status: DC
Start: 2014-11-22 — End: 2014-11-25
  Administered 2014-11-22 – 2014-11-25 (×6): 1 via ORAL
  Filled 2014-11-22 (×7): qty 1

## 2014-11-22 MED ORDER — LEVOFLOXACIN IN D5W 750 MG/150ML IV SOLN
INTRAVENOUS | Status: AC
  Administered 2014-11-22: 750 mg via INTRAVENOUS
  Filled 2014-11-22: qty 150

## 2014-11-22 MED ORDER — DEXTROSE 5 % IV SOLN
500.0000 mg | Freq: Every day | INTRAVENOUS | Status: DC
  Administered 2014-11-22 – 2014-11-24 (×3): 500 mg via INTRAVENOUS
  Filled 2014-11-22 (×4): qty 500

## 2014-11-22 MED ORDER — SODIUM CHLORIDE 0.9 % IV SOLN
INTRAVENOUS | Status: AC
  Administered 2014-11-22 – 2014-11-23 (×3): via INTRAVENOUS

## 2014-11-22 MED ORDER — VITAMIN D3 25 MCG (1000 UNIT) PO TABS
2000.0000 [IU] | ORAL_TABLET | ORAL | Status: DC
  Administered 2014-11-23 – 2014-11-25 (×3): 2000 [IU] via ORAL
  Filled 2014-11-22 (×5): qty 2

## 2014-11-22 MED ORDER — MAGNESIUM SULFATE 2 GM/50ML IV SOLN
INTRAVENOUS | Status: AC
Start: 2014-11-22 — End: 2014-11-22
  Administered 2014-11-22: 2 g via INTRAVENOUS
  Filled 2014-11-22: qty 50

## 2014-11-22 MED ORDER — OMEGA-3-ACID ETHYL ESTERS 1 G PO CAPS
1.0000 g | ORAL_CAPSULE | Freq: Every day | ORAL | Status: DC
  Administered 2014-11-22 – 2014-11-25 (×4): 1 g via ORAL
  Filled 2014-11-22 (×4): qty 1

## 2014-11-22 MED ORDER — ASPIRIN EC 81 MG PO TBEC
81.0000 mg | DELAYED_RELEASE_TABLET | Freq: Every day | ORAL | Status: DC
  Administered 2014-11-23 – 2014-11-25 (×3): 81 mg via ORAL
  Filled 2014-11-22 (×4): qty 1

## 2014-11-22 MED ORDER — IPRATROPIUM-ALBUTEROL 0.5-2.5 (3) MG/3ML IN SOLN
3.0000 mL | RESPIRATORY_TRACT | Status: DC
  Administered 2014-11-22 – 2014-11-24 (×14): 3 mL via RESPIRATORY_TRACT
  Filled 2014-11-22 (×14): qty 3

## 2014-11-22 MED ORDER — SENNA 8.6 MG PO TABS: 1.0000 | ORAL_TABLET | Freq: Every day | ORAL | Status: DC | PRN

## 2014-11-22 MED ORDER — METFORMIN HCL 500 MG PO TABS
500.0000 mg | ORAL_TABLET | Freq: Every day | ORAL | Status: DC
  Administered 2014-11-22 – 2014-11-25 (×4): 500 mg via ORAL
  Filled 2014-11-22 (×4): qty 1

## 2014-11-22 MED ORDER — ACETAMINOPHEN 325 MG PO TABS
650.0000 mg | ORAL_TABLET | Freq: Four times a day (QID) | ORAL | Status: DC | PRN
Start: 2014-11-22 — End: 2014-11-25

## 2014-11-22 MED ORDER — ACETAMINOPHEN 650 MG RE SUPP: 650.0000 mg | Freq: Four times a day (QID) | RECTAL | Status: DC | PRN

## 2014-11-22 MED ORDER — DILTIAZEM HCL 25 MG/5ML IV SOLN: 5.0000 mg | Freq: Four times a day (QID) | INTRAVENOUS | Status: DC | PRN

## 2014-11-22 MED ORDER — CEFTRIAXONE SODIUM IN DEXTROSE 20 MG/ML IV SOLN
1.0000 g | Freq: Every day | INTRAVENOUS | Status: DC
  Administered 2014-11-22 – 2014-11-24 (×3): 1 g via INTRAVENOUS
  Filled 2014-11-22 (×4): qty 50

## 2014-11-22 MED ORDER — ALBUTEROL SULFATE (2.5 MG/3ML) 0.083% IN NEBU
2.5000 mg | INHALATION_SOLUTION | RESPIRATORY_TRACT | Status: DC | PRN
  Administered 2014-11-22: 2.5 mg via RESPIRATORY_TRACT
  Filled 2014-11-22: qty 3

## 2014-11-22 NOTE — H&P (Signed)
[facility] [place] NAME: Alan Weaver    MR#:  [id number]  DATE OF BIRTH:  Nov 23, 1926  DATE OF ADMISSION:  11/22/2014  PRIMARY CARE PHYSICIAN: Lelon Huh, MD   REQUESTING/REFERRING PHYSICIAN: Dr. Loura Pardon  CHIEF COMPLAINT:   Chief Complaint  Patient presents with  . Shortness of Breath    HISTORY OF PRESENT ILLNESS:  Alan Weaver  is a 81 y.o. male with a known history of COPD not on any home oxygen, former smoker, history of irregular heart beat, prostate cancer or diabetes brought from home secondary to worsening respiratory distress this morning. Patient lives at home by himself and his family helps him. His ambulation is chronically limited by exertional dyspnea at baseline. According to daughter who gives most of the history at bedside, patient has been having worsening trouble breathing for the last 3 days. He says he had an episode of fever and chills yesterday. Continues to cough productive of sputum. Last night he couldn't sleep at all with his respiratory distress and so presented to the ER. Patient was very tachypneic hypoxic on presentation and he is on BiPAP currently. He states he is feeling better on the BiPAP. His heart rate is elevated with several PVCs.  PAST MEDICAL HISTORY:   Past Medical History  Diagnosis Date  . Heart murmur   . COPD (chronic obstructive pulmonary disease)   . High blood cholesterol level   . Irritable bowel syndrome   . Cancer 1994    prostate  . Diabetes mellitus without complication   . Osteoporosis   . Incontinence   . COPD (chronic obstructive pulmonary disease) 2000  . Atrial fibrillation     PAST SURGICAL HISTORY:   Past Surgical History  Procedure Laterality Date  . Intraocular lens insertion  2002  . Colonoscopy    . Prostate surgery  1994  . Hernia repair  2011  . Surgery for staph infection  1995  . Colonoscopy  2011    SOCIAL HISTORY:   History   Substance Use Topics  . Smoking status: Former Research scientist (life sciences)  . Smokeless tobacco: Never Used  . Alcohol Use: No    FAMILY HISTORY:   Family History  Problem Relation Age of Onset  . Cancer Father     bladder  . Other Sister     brain tumor  . Cancer Brother     colon    DRUG ALLERGIES:  No Known Allergies  REVIEW OF SYSTEMS:   Review of Systems  Constitutional: Positive for fever, chills and malaise/fatigue. Negative for weight loss.  HENT: Negative for ear discharge, ear pain, hearing loss, nosebleeds and tinnitus.   Eyes: Negative for blurred vision, double vision and photophobia.  Respiratory: Positive for cough, sputum production, shortness of breath and wheezing.   Cardiovascular: Negative for chest pain, palpitations, orthopnea and leg swelling.  Gastrointestinal: Positive for constipation. Negative for heartburn, nausea, vomiting, abdominal pain, diarrhea and melena.  Genitourinary: Negative for dysuria, urgency, frequency and hematuria.  Musculoskeletal: Negative for myalgias, back pain and neck pain.  Skin: Negative for rash.  Neurological: Negative for dizziness, tingling, tremors, sensory change, speech change, focal weakness and headaches.  Endo/Heme/Allergies: Does not bruise/bleed easily.  Psychiatric/Behavioral: Negative for depression.    MEDICATIONS AT HOME:   Prior to Admission medications   Medication Sig Start Date End Date Taking? Authorizing Provider  ALBUTEROL SULFATE HFA IN Inhale 1 puff into the lungs 4 (four) times daily.  Historical Provider, MD  alendronate (FOSAMAX) 70 MG tablet Take 70 mg by mouth every 7 (seven) days. Take with a full glass of water on an empty stomach.    Historical Provider, MD  Calcium Carbonate-Vitamin D (CALCIUM + D PO) Take 600 mg by mouth 2 (two) times daily.    Historical Provider, MD  Fluticasone-Salmeterol (ADVAIR DISKUS) 250-50 MCG/DOSE AEPB Inhale 1 puff into the lungs 2 (two) times daily.    Historical Provider,  MD  Leuprolide Acetate (LUPRON IJ) Inject as directed as directed.    Historical Provider, MD  metFORMIN (GLUCOPHAGE) 500 MG tablet Take 500 mg by mouth daily.    Historical Provider, MD  Omega-3 Fatty Acids (FISH OIL) 1200 MG CAPS Take 1 capsule by mouth 2 (two) times daily.    Historical Provider, MD  polyethylene glycol powder (GLYCOLAX/MIRALAX) powder 255 grams one bottle for colonoscopy prep 01/27/13   Robert Bellow, MD  pravastatin (PRAVACHOL) 40 MG tablet Take 40 mg by mouth daily.    Historical Provider, MD  tiotropium (SPIRIVA) 18 MCG inhalation capsule Place 18 mcg into inhaler and inhale daily.    Historical Provider, MD      VITAL SIGNS:  Blood pressure 169/94, pulse 133, temperature 98.4 F (36.9 C), temperature source Axillary, resp. rate 28, height '5\' 6"'$  (1.676 m), weight 79.379 kg (175 lb), SpO2 93 %.  PHYSICAL EXAMINATION:   Physical Exam  GENERAL:  81 y.o.-year-old patient sitting in bed with the acute respiratory distress, currently on BiPAP machine.[name]  EYES: Pupils equal, round, reactive to light and accommodation. No scleral icterus. Extraocular muscles intact.  HEENT: Head atraumatic, normocephalic. Oropharynx and nasopharynx clear.  NECK:  Supple, no jugular venous distention. No thyroid enlargement, no tenderness.  LUNGS: Very scant breath sounds bilaterally. Very tight on auscultation. Scattered wheezing present, no rales,rhonchi or crepitation. He was using his accessory muscles to breathe an hour ago. Right now seems to be more calm  CARDIOVASCULAR: S1, S2 normal. No murmurs, rubs, or gallops.  ABDOMEN: Soft, nontender, nondistended. Bowel sounds present. No organomegaly or mass.  EXTREMITIES: No pedal edema, cyanosis, or clubbing.  NEUROLOGIC: Cranial nerves II through XII are intact. Muscle strength 5/5 in all extremities. Sensation intact. Gait not checked.  PSYCHIATRIC: The patient is alert and oriented x 3.  SKIN: No obvious rash, lesion, or ulcer.    LABORATORY PANEL:   CBC  Recent Labs Lab 11/22/14 0859  WBC 13.7*  HGB 14.8  HCT 45.7  PLT 286   ------------------------------------------------------------------------------------------------------------------  Chemistries   Recent Labs Lab 11/22/14 0859  NA 138  K 4.3  CL 100*  CO2 27  GLUCOSE 172*  BUN 23*  CREATININE 1.27*  CALCIUM 8.9  AST 18  ALT 11*  ALKPHOS 60  BILITOT 0.9   ------------------------------------------------------------------------------------------------------------------  Cardiac Enzymes  Recent Labs Lab 11/22/14 0859  TROPONINI 0.03   ------------------------------------------------------------------------------------------------------------------  RADIOLOGY:  Dg Chest Portable 1 View  11/22/2014   CLINICAL DATA:  Shortness of breath.  Known COPD.  EXAM: PORTABLE CHEST - 1 VIEW  COMPARISON:  06/07/2013  FINDINGS: Lungs are adequately inflated with emphysematous disease most prominent over the left mid to upper lung. There is opacification within the left base likely a small effusion with associated atelectasis, although cannot exclude infection. There is a 2.9 cm nodular density lateral to the right hilum which is new cardiomediastinal silhouette is within normal. There is calcified plaque over the aortic arch. Remainder the exam is unchanged.  IMPRESSION: Left base  opacification likely small effusion with associated atelectasis although cannot exclude infection in the left base.  2.9 cm nodular opacity lateral to the right hilum. Recommend CT chest for further evaluation.  Emphysematous disease.   Electronically Signed   By: Marin Olp M.D.   On: 11/22/2014 09:08    EKG:   Orders placed or performed during the hospital encounter of 11/22/14  . ED EKG  . ED EKG    IMPRESSION AND PLAN:   Alan Weaver  is a 81 y.o. male with a known history of COPD not on any home oxygen, former smoker, history of irregular heart beat,  prostate cancer or diabetes brought from home secondary to acute on chronic COPD exacerbation.  #1 acute hypoxic respiratory failure-secondary to acute on chronic COPD exacerbation. - Also community-acquired pneumonia. -Not on any home oxygen. Right now on BiPAP. We'll try to wean him to nasal cannula. -IV Solu-Medrol 3 times a day. DuoNeb's. Continue his home inhalers. - Blood cultures have been done in the emergency room, will start Rocephin and azithromycin.  #2 Sepsis- tachycardia elevated white count. Secondary to pneumonia. Blood cultures are ordered. On IV antibiotics. -Monitor blood pressure. IV fluids if needed.  #2 Tachycardia-known history of A. fib. Multiple PVCs now. Check magnesium and replace it if needed. Seems to be sinus tachycardia at this time. We'll get an echocardiogram.  #3 diabetes mellitus-continue home medications, HbA1c ordered. Sliding scale insulin.  #4 hypertension-continue home medications. Watch with his pneumonia, if his blood pressures are trending low, we'll stop his medications.  #5 DVT prophylaxis-we will start on subcutaneous heparin.    All the records are reviewed and case discussed with ED provider. Management plans discussed with the patient, family and they are in agreement.  CODE STATUS: Full code  TOTAL TIME TAKING CARE OF THIS PATIENT: 53 minutes.    Gladstone Lighter M.D on 11/22/2014 at 10:28 AM  Between 7am to 6pm - Pager - [phone]  After 6pm go to [email or web address] - password [email or web address]  Office  [phone]  CC: Primary care physician; Lelon Huh, MD

## 2014-11-22 NOTE — Care Management (Signed)
Spoke with patient remotely regarding discharge planning. He agrees to home health if needed. His PCP is Dr. Arnaldo Natal. He has transportation to physician. O2 is new. RNCM to continue to follow.

## 2014-11-22 NOTE — ED Provider Notes (Signed)
[facility] Emergency Department Provider Note  ____________________________________________  Time seen: Approximately 8:48 AM  I have reviewed the triage vital signs and the nursing notes.   HISTORY  Chief Complaint No chief complaint on file.  Limited due to respiratory distress.  HPI Alan Weaver is a 81 y.o. male with history of COPD, diabetes, hyperlipidemia presents for evaluation of intermittent shortness of breath. Patient reports that he has been short of breath for the past 3 or 4 days however shortness of breath increased significantly yesterday. He has been using his metered-dose inhalers at home however the have not been helping today. He has also had cough productive of whitish phlegm. No fevers. No chest pain. No vomiting or diarrhea. No abdominal pain. Severity is 10 out of 10. On EMS arrival, hypoxic to 86% which improved with a total of 4 albuterol and albuterol/ipratropium treatments. EMS also gave 125 mg of Solu-Medrol IV.   Past Medical History  Diagnosis Date  . Heart murmur   . COPD (chronic obstructive pulmonary disease)   . High blood cholesterol level   . Irritable bowel syndrome   . Cancer 1994    prostate  . Diabetes mellitus without complication   . Osteoporosis   . Incontinence   . COPD (chronic obstructive pulmonary disease) 2000    Patient Active Problem List   Diagnosis Date Noted  . Personal history of colonic polyps 01/08/2013    Past Surgical History  Procedure Laterality Date  . Intraocular lens insertion  2002  . Colonoscopy    . Prostate surgery  1994  . Hernia repair  2011  . Surgery for staph infection  1995  . Colonoscopy  2011    Current Outpatient Rx  Name  Route  Sig  Dispense  Refill  . ALBUTEROL SULFATE HFA IN   Inhalation   Inhale 1 puff into the lungs 4 (four) times daily.         [name] alendronate (FOSAMAX) 70 MG tablet   Oral   Take 70 mg by mouth every 7 (seven) days. Take with a full  glass of water on an empty stomach.         . Calcium Carbonate-Vitamin D (CALCIUM + D PO)   Oral   Take 600 mg by mouth 2 (two) times daily.         . Cholecalciferol (VITAMIN D-3 PO)   Oral   Take 1 tablet by mouth 2 (two) times daily.         . Fluticasone-Salmeterol (ADVAIR DISKUS) 250-50 MCG/DOSE AEPB   Inhalation   Inhale 1 puff into the lungs 2 (two) times daily.         [name] Leuprolide Acetate (LUPRON IJ)   Injection   Inject as directed as directed.         . metFORMIN (GLUCOPHAGE) 500 MG tablet   Oral   Take 500 mg by mouth daily.         . Omega-3 Fatty Acids (FISH OIL) 1200 MG CAPS   Oral   Take 1 capsule by mouth 2 (two) times daily.         . polyethylene glycol powder (GLYCOLAX/MIRALAX) powder      255 grams one bottle for colonoscopy prep   255 g   0   . pravastatin (PRAVACHOL) 40 MG tablet   Oral   Take 40 mg by mouth daily.         [name] tiotropium (SPIRIVA) 18 MCG  inhalation capsule   Inhalation   Place 18 mcg into inhaler and inhale daily.           Allergies Review of patient's allergies indicates no known allergies.  Family History  Problem Relation Age of Onset  . Cancer Father     bladder  . Other Sister     brain tumor  . Cancer Brother     colon    Social History History  Substance Use Topics  . Smoking status: Never Smoker   . Smokeless tobacco: Never Used  . Alcohol Use: No    Review of Systems Constitutional: No fever/chills Eyes: No visual changes. ENT: No sore throat. Cardiovascular: Denies chest pain. Respiratory: + shortness of breath. Gastrointestinal: No abdominal pain.  No nausea, no vomiting.  No diarrhea.  No constipation. Genitourinary: Negative for dysuria. Musculoskeletal: Negative for back pain. Skin: Negative for rash. Neurological: Negative for headaches, focal weakness or numbness.  10-point ROS otherwise negative.  ____________________________________________   PHYSICAL  EXAM:   Filed Vitals:   11/22/14 0905 11/22/14 0906  BP: 169/94   Pulse: 133   Temp: 98.4 F (36.9 C)   TempSrc: Axillary   Resp: 28   Height: '5\' 6"'$  (1.676 m)   Weight: 175 lb (79.379 kg)   SpO2: 95% 93%   VITAL SIGNS: ED Triage Vitals  Enc Vitals Group     BP --      Pulse --      Resp --      Temp --      Temp src --      SpO2 --      Weight --      Height --      Head Cir --      Peak Flow --      Pain Score --      Pain Loc --      Pain Edu? --      Excl. in [place]? --     Constitutional: Alert and oriented. Moderate to severe respiratory distress. Eyes: Conjunctivae are normal. PERRL. EOMI. Head: Atraumatic. Nose: No congestion/rhinnorhea. Mouth/Throat: Mucous membranes are moist.  Oropharynx non-erythematous. Neck: No stridor. Cardiovascular: tachycardic rate, regular rhythm. Grossly normal heart sounds.  Good peripheral circulation. Respiratory: Severely increased work of breathing, +tachypnea,  prolonged expiratory phase, poor air movement bilaterally without significant wheeze Gastrointestinal: Soft and nontender. No distention. No abdominal bruits. No CVA tenderness. Genitourinary: deferred Musculoskeletal: No lower extremity tenderness nor edema.  No joint effusions. Neurologic:  Normal speech and language. No gross focal neurologic deficits are appreciated. Speech is normal. No gait instability. Skin:  Skin is warm, dry and intact. No rash noted. Psychiatric: Mood and affect are normal. Speech and behavior are normal.  ____________________________________________   LABS (all labs ordered are listed, but only abnormal results are displayed)  Labs Reviewed  CULTURE, BLOOD (ROUTINE X 2)  CULTURE, BLOOD (ROUTINE X 2)  CBC WITH DIFFERENTIAL/PLATELET  COMPREHENSIVE METABOLIC PANEL  TROPONIN I  BRAIN NATRIURETIC PEPTIDE   ____________________________________________  EKG  ED ECG REPORT I, Joanne Gavel, the attending physician, personally viewed  and interpreted this ECG.   Date: 11/22/2014  EKG Time: 08:52  Rate: 133  Rhythm: sinus tachycardia, PVCs  Axis: Normal  Intervals:QTc prolonged at 565 ms  ST&T Change: No acute ST segment elevation  ____________________________________________  RADIOLOGY  CXR IMPRESSION: Left base opacification likely small effusion with associated atelectasis although cannot exclude infection in the left base.  2.9  cm nodular opacity lateral to the right hilum. Recommend CT chest for further evaluation.  Emphysematous disease.  ____________________________________________   PROCEDURES  Procedure(s) performed: None  Critical Care performed: Yes, see critical care note(s). Total critical care time spent 40 minutes.  ____________________________________________   INITIAL IMPRESSION / ASSESSMENT AND PLAN / ED COURSE  Pertinent labs & imaging results that were available during my care of the patient were reviewed by me and considered in my medical decision making (see chart for details).  Alan Weaver is a 81 y.o. male with history of COPD, diabetes, hyperlipidemia presents for evaluation of intermittent shortness of breath. On arrival to the emergency department, he is noted to be in severe respiratory distress, breathing through pursed lips, increased work of breathing, poor air movement. Clinical picture is consistent with COPD exacerbation. He is already improving on BiPAP. I had ordered magnesium however his blood pressure was starting to trend down so will not give any additional magnesium at this time. Chest x-ray concerning for possible left lower lobe pneumonia. Levaquin ordered. Anticipate admission.  ----------------------------------------- 9:52 AM on 11/22/2014 ----------------------------------------- Labs notable for leukocytosis. BNP slightly elevated but no pulmonary edema on chest x-ray. Mild creatinine elevation at 1.27. Improved work of breathing on BiPAP now satting  100% on monitor. Discussed with hospitalist for admission.  ____________________________________________   FINAL CLINICAL IMPRESSION(S) / ED DIAGNOSES  Final diagnoses:  SOB (shortness of breath)  COPD with acute exacerbation      Joanne Gavel, MD 11/22/14 [phone]

## 2014-11-22 NOTE — ED Notes (Signed)
Per EMS pt has had SOB since yesterday, EMS states daughter called 911 this morning. PT has been taking MDI for SOB. EMS states decreased breath sounds bilaterally, pt has a cough with small amounts of yellow sputum. EMS gave '1mg'$  Duoneb, '2mg'$  Albuterol, and '125mg'$  Solumedrol en route. Pt has hx of COPD.

## 2014-11-22 NOTE — Procedures (Signed)

## 2014-11-22 NOTE — Progress Notes (Signed)
Pt. admitted to unit. Oriented to room, call bell, Ascom phones and staff. Bed in low position, non-skid socks in place, bed alarm on. Fall safety plan reviewed. Full assessment to Epic, SCDs on. Will continue to monitor.

## 2014-11-22 NOTE — Progress Notes (Signed)
Dr. Volanda Napoleon paged and notified of urine output of 21m since 1230. Pt reports urge "to go" but cannot produce any urine. Bladder scan revealed 726m Dr. WaVolanda Napoleoncknowledged and said she will order IV fluids, pt has no history of CHF.

## 2014-11-22 NOTE — Progress Notes (Deleted)
Two 2A floor nurses have attempted to obtain IV access with no success. Nursing supervisor notified and unable to aide, told to notify CCU. Katie RN from CCU asked to come try and she said she will come when she can.

## 2014-11-23 ENCOUNTER — Inpatient Hospital Stay
Admit: 2014-11-23 | Discharge: 2014-11-23 | Disposition: A | Discharge status: Home / Self Care | Payer: Commercial Managed Care - HMO | Attending: Internal Medicine | Admitting: Internal Medicine

## 2014-11-23 LAB — CBC
HCT: 39 % — ABNORMAL LOW (ref 40.0–52.0)
HEMOGLOBIN: 12.7 g/dL — AB (ref 13.0–18.0)
MCH: 28.8 pg (ref 26.0–34.0)
MCHC: 32.6 g/dL (ref 32.0–36.0)
MCV: 88.1 fL (ref 80.0–100.0)
PLATELETS: 260 10*3/uL (ref 150–440)
RBC: 4.42 MIL/uL (ref 4.40–5.90)
RDW: 15 % — AB (ref 11.5–14.5)
WBC: 11.8 10*3/uL — ABNORMAL HIGH (ref 3.8–10.6)

## 2014-11-23 LAB — GLUCOSE, CAPILLARY
GLUCOSE-CAPILLARY: 223 mg/dL — AB (ref 65–99)
Glucose-Capillary: 246 mg/dL — ABNORMAL HIGH (ref 65–99)
Glucose-Capillary: 198 mg/dL — ABNORMAL HIGH (ref 65–99)
Glucose-Capillary: 214 mg/dL — ABNORMAL HIGH (ref 65–99)

## 2014-11-23 LAB — BASIC METABOLIC PANEL
Anion gap: 11 (ref 5–15)
BUN: 27 mg/dL — AB (ref 6–20)
CO2: 24 mmol/L (ref 22–32)
Calcium: 8.4 mg/dL — ABNORMAL LOW (ref 8.9–10.3)
Chloride: 102 mmol/L (ref 101–111)
Creatinine, Ser: 1.28 mg/dL — ABNORMAL HIGH (ref 0.61–1.24)
GFR, EST AFRICAN AMERICAN: 57 mL/min — AB (ref >60)
GFR, EST NON AFRICAN AMERICAN: 49 mL/min — AB (ref >60)
Glucose, Bld: 229 mg/dL — ABNORMAL HIGH (ref 65–99)
POTASSIUM: 3.8 mmol/L (ref 3.5–5.1)
Sodium: 137 mmol/L (ref 135–145)

## 2014-11-23 MED ORDER — MAGNESIUM OXIDE 400 (241.3 MG) MG PO TABS
400.0000 mg | ORAL_TABLET | Freq: Every day | ORAL | Status: AC
  Administered 2014-11-23 – 2014-11-24 (×2): 400 mg via ORAL
  Filled 2014-11-23 (×2): qty 1

## 2014-11-23 MED ORDER — METHYLPREDNISOLONE SODIUM SUCC 40 MG IJ SOLR
40.0000 mg | Freq: Two times a day (BID) | INTRAMUSCULAR | Status: DC
  Administered 2014-11-23 – 2014-11-25 (×4): 40 mg via INTRAVENOUS
  Filled 2014-11-23 (×4): qty 1

## 2014-11-23 NOTE — Care Management (Signed)
Patient presents from home.  Physical therapy is recommending skilled nursing placement but patient is not sure he wants to go.  Says that he at baseline is not able to ambulate very far.  He has stools  "set up all over the house so i can stop and sit to rest."  He would be agreeable to home health services.  No agency preference. Discussed with primary nurse the need to assess for home 02

## 2014-11-23 NOTE — Progress Notes (Signed)
Patient has been diuresing  well this shift, no issues. Denied any pain, no acute distress noted.

## 2014-11-23 NOTE — Progress Notes (Signed)
*  PRELIMINARY RESULTS* Echocardiogram 2D Echocardiogram has been performed.  Alan Weaver 11/23/2014, 12:08 PM

## 2014-11-23 NOTE — Clinical Social Work Note (Signed)
CSW spoke to pt.  He was alert and Ox3.  CSW explained what SNF was, however pt is refusing SNF placement at this time.  CSW asked if he would have help at home, pt stated that he did have son's that both lived close by and would also be able to come help him when he was discharged.  SNF placement refused at this time.  CSW signing off.

## 2014-11-23 NOTE — Progress Notes (Signed)
Alan Weaver at [place] NAME: Alan Weaver    MR#:  [id number]  DATE OF BIRTH:  [birth year]  SUBJECTIVE:  CHIEF COMPLAINT:   Chief Complaint  Patient presents with  . Shortness of Breath  feels better, off bipap, now on 2l o2- not on home o2 at baseline. Admitted for COPD exacerbation and pneumonia  REVIEW OF SYSTEMS:  Review of Systems  Constitutional: Negative for fever and chills.  Respiratory: Positive for cough and shortness of breath. Negative for wheezing.   Cardiovascular: Negative for chest pain and palpitations.  Gastrointestinal: Negative for nausea, vomiting, abdominal pain, diarrhea and constipation.  Genitourinary: Negative for dysuria.  Neurological: Negative for dizziness, seizures and headaches.    DRUG ALLERGIES:  No Known Allergies  VITALS:  Blood pressure 128/56, pulse 104, temperature 97.6 F (36.4 C), temperature source Oral, resp. rate 20, height '5\' 6"'$  (1.676 m), weight 80.65 kg (177 lb 12.8 oz), SpO2 96 %.  PHYSICAL EXAMINATION:  Physical Exam  GENERAL:  81 y.o.-year-old patient lying in the bed with no acute distress.  EYES: Pupils equal, round, reactive to light and accommodation. No scleral icterus. Extraocular muscles intact.  HEENT: Head atraumatic, normocephalic. Oropharynx and nasopharynx clear.  NECK:  Supple, no jugular venous distention. No thyroid enlargement, no tenderness.  LUNGS: scant breath sounds on left post, right sided breath sounds are more normal, scattered wheeze, no rales or rhonchi noted. CARDIOVASCULAR: S1, S2 normal. No murmurs, rubs, or gallops.  ABDOMEN: Soft, nontender, nondistended. Bowel sounds present. No organomegaly or mass.  EXTREMITIES: No pedal edema, cyanosis, or clubbing.  NEUROLOGIC: Cranial nerves II through XII are intact. Muscle strength 5/5 in all extremities. Sensation intact. Gait not checked.  PSYCHIATRIC: The patient is alert and oriented x 3.  SKIN: No  obvious rash, lesion, or ulcer.    LABORATORY PANEL:   CBC  Recent Labs Lab 11/23/14 0416  WBC 11.8*  HGB 12.7*  HCT 39.0*  PLT 260   ------------------------------------------------------------------------------------------------------------------  Chemistries   Recent Labs Lab 11/22/14 0859 11/23/14 0416  NA 138 137  K 4.3 3.8  CL 100* 102  CO2 27 24  GLUCOSE 172* 229*  BUN 23* 27*  CREATININE 1.27* 1.28*  CALCIUM 8.9 8.4*  MG 1.6*  --   AST 18  --   ALT 11*  --   ALKPHOS 60  --   BILITOT 0.9  --    ------------------------------------------------------------------------------------------------------------------  Cardiac Enzymes  Recent Labs Lab 11/22/14 0859  TROPONINI 0.03   ------------------------------------------------------------------------------------------------------------------  RADIOLOGY:  Dg Chest Portable 1 View  11/22/2014   CLINICAL DATA:  Shortness of breath.  Known COPD.  EXAM: PORTABLE CHEST - 1 VIEW  COMPARISON:  06/07/2013  FINDINGS: Lungs are adequately inflated with emphysematous disease most prominent over the left mid to upper lung. There is opacification within the left base likely a small effusion with associated atelectasis, although cannot exclude infection. There is a 2.9 cm nodular density lateral to the right hilum which is new cardiomediastinal silhouette is within normal. There is calcified plaque over the aortic arch. Remainder the exam is unchanged.  IMPRESSION: Left base opacification likely small effusion with associated atelectasis although cannot exclude infection in the left base.  2.9 cm nodular opacity lateral to the right hilum. Recommend CT chest for further evaluation.  Emphysematous disease.   Electronically Signed   By: Alan Weaver M.D.   On: 11/22/2014 09:08    EKG:   Orders placed  or performed during the hospital encounter of 11/22/14  . ED EKG  . ED EKG  . EKG 12-Lead  . EKG 12-Lead    ASSESSMENT AND  PLAN:   Alan Weaver is a 81 y.o. male with a known history of COPD not on any home oxygen, former smoker, history of irregular heart beat, prostate cancer or diabetes brought from home secondary to acute on chronic COPD exacerbation.  #1 acute hypoxic respiratory failure-secondary to acute on chronic COPD exacerbation. - Also community-acquired pneumonia. -Not on any home oxygen. Off Bipap, weaned to 2-3 L nasal cannula. -Decrease IV Solu-Medrol dose today. DuoNeb's. Continue his home inhalers. - Blood cultures are pending,  - continue Rocephin and azithromycin.  #2 Sepsis- tachycardia elevated white count. Secondary to pneumonia. Blood cultures are ordered. On IV antibiotics. -Monitor blood pressure. IV fluids started  #2 Tachycardia-known history of A. fib. Multiple PVCs now. Replace magnesium. Seems to be sinus tachycardia at this time.  - echocardiogram pending.  #3 diabetes mellitus-continue home medications- on metformin, HbA1c 6.4. Sliding scale insulin. Sugars are elevated now due to steroids  #4 hypertension-BP at baseline, not on any meds at home.Monitor for now.  #5 DVT prophylaxis- on subcutaneous heparin.   Physical Therapy recommended rehab- however patient refusing. Will discharge with home health in 2 days Home o2 needs will be assessed tomorrow  All the records are reviewed and case discussed with Care Management/Social Workerr. Management plans discussed with the patient, family and they are in agreement.  CODE STATUS: Full code  TOTAL TIME TAKING CARE OF THIS PATIENT: 40 minutes.   POSSIBLE D/C IN 2 DAYS, DEPENDING ON CLINICAL CONDITION.   Alan Weaver M.D on 11/23/2014 at 10:47 AM  Between 7am to 6pm - Pager - [phone]  After 6pm go to [email or web address] - password [email or web address] [facility]  Office  [phone]  CC: Primary care physician; Alan Huh, MD

## 2014-11-23 NOTE — Evaluation (Signed)
Physical Therapy Evaluation Patient Details Name: Alan Weaver MRN: [id number] DOB: [birth year] Today's Date: 11/23/2014   History of Present Illness  81 yo male with onset SOB and COPD exacerbation, sepsis from PNA and acute respiratory failure was admitted for symptoms and referred to PT.  PMHx:  heart murmur, COPD, DM, prostate CA, a-fib  Clinical Impression  Pt was seen for evaluation of O2 sats with a decline off O2 down to 85% on room air.  Pt is extremely SOB and has dramatic LE weakness already, making him a candidate for SNF.  Has limited home help as he is alone and will need an advantage of some increased rehab time to make this a success to transition there.    Follow Up Recommendations SNF    Equipment Recommendations  Rolling walker with 5" wheels    Recommendations for Other Services       Precautions / Restrictions Precautions Precautions: Fall (telemetry) Restrictions Weight Bearing Restrictions: No      Mobility  Bed Mobility Overal bed mobility: Modified Independent             General bed mobility comments: HOB elevated  Transfers Overall transfer level: Needs assistance Equipment used: Rolling walker (2 wheeled);1 person hand held assist Transfers: Sit to/from [facility] Sit to Stand: Min guard;Min assist Stand pivot transfers: Min guard;Min assist       General transfer comment: cues for hand placement and safety to manage IV pole  Ambulation/Gait Ambulation/Gait assistance: Min guard;Min assist Ambulation Distance (Feet): 50 Feet Assistive device: Rolling walker (2 wheeled);1 person hand held assist   Gait velocity: reduced Gait velocity interpretation: Below normal speed for age/gender General Gait Details: step to with slow pace and flexed posture, weak to maneuver with wide turning radius  Stairs            Wheelchair Mobility    Modified Rankin (Stroke Patients Only)       Balance Overall balance  assessment: Needs assistance Sitting-balance support: Feet supported Sitting balance-Leahy Scale: Good   Postural control: Posterior lean Standing balance support: Bilateral upper extremity supported Standing balance-Leahy Scale: Fair Standing balance comment: fair- dynamic balance                             Pertinent Vitals/Pain Pain Assessment: No/denies pain    Home Living Family/patient expects to be discharged to:: Private residence Living Arrangements: Alone Available Help at Discharge: Family;Available PRN/intermittently Type of Home: House Home Access: Stairs to enter Entrance Stairs-Rails: Left Entrance Stairs-Number of Steps: 4 Home Layout: One level        Prior Function Level of Independence: Independent               Hand Dominance        Extremity/Trunk Assessment   Upper Extremity Assessment: Overall WFL for tasks assessed           Lower Extremity Assessment: Generalized weakness      Cervical / Trunk Assessment: Kyphotic  Communication   Communication: No difficulties  Cognition Arousal/Alertness: Awake/alert Behavior During Therapy: WFL for tasks assessed/performed Overall Cognitive Status: Within Functional Limits for tasks assessed                      General Comments General comments (skin integrity, edema, etc.): Pt was assessed for O2 sats during mobility and cannot be off O2 without dropping with short gait trips.  Sat  declined from 98% to 85% with 50' off O2.    Exercises        Assessment/Plan    PT Assessment Patient needs continued PT services  PT Diagnosis Difficulty walking;Generalized weakness   PT Problem List Decreased strength;Decreased range of motion;Decreased activity tolerance;Decreased mobility;Decreased balance;Decreased coordination;Decreased knowledge of use of DME;Cardiopulmonary status limiting activity  PT Treatment Interventions DME instruction;Gait training;Stair  training;Functional mobility training;Therapeutic activities;Therapeutic exercise;Balance training;Neuromuscular re-education;Patient/family education   PT Goals (Current goals can be found in the Care Plan section) Acute Rehab PT Goals Patient Stated Goal: to go home to his dog PT Goal Formulation: With patient Time For Goal Achievement: 12/07/14 Potential to Achieve Goals: Good    Frequency Min 2X/week   Barriers to discharge Inaccessible home environment;Decreased caregiver support Home with sporadic family help    Co-evaluation               End of Session Equipment Utilized During Treatment: Gait belt Activity Tolerance: Patient limited by fatigue;Treatment limited secondary to medical complications (Comment) (O2 sats dropped and pt felt unsafe) Patient left: in bed;with call bell/phone within reach;with bed alarm set Nurse Communication: Mobility status         Time: 0822-0855 PT Time Calculation (min) (ACUTE ONLY): 33 min   Charges:   PT Evaluation $Initial PT Evaluation Tier I: 1 Procedure PT Treatments $Gait Training: 8-22 mins   PT G CodesRamond Dial 12-05-14, 9:18 AM  Mee Hives, PT MS Acute Rehab Dept. Number: [facility] [phone] and [place] [phone]

## 2014-11-24 ENCOUNTER — Inpatient Hospital Stay: Payer: Commercial Managed Care - HMO

## 2014-11-24 DIAGNOSIS — J189 Pneumonia, unspecified organism: Secondary | ICD-10-CM

## 2014-11-24 DIAGNOSIS — A419 Sepsis, unspecified organism: Secondary | ICD-10-CM

## 2014-11-24 LAB — GLUCOSE, CAPILLARY
GLUCOSE-CAPILLARY: 174 mg/dL — AB (ref 65–99)
GLUCOSE-CAPILLARY: 150 mg/dL — AB (ref 65–99)
Glucose-Capillary: 225 mg/dL — ABNORMAL HIGH (ref 65–99)
Glucose-Capillary: 173 mg/dL — ABNORMAL HIGH (ref 65–99)

## 2014-11-24 MED ORDER — AMLODIPINE BESYLATE 5 MG PO TABS
5.0000 mg | ORAL_TABLET | Freq: Every day | ORAL | Status: DC
  Administered 2014-11-24 – 2014-11-25 (×2): 5 mg via ORAL
  Filled 2014-11-24 (×2): qty 1

## 2014-11-24 MED ORDER — IPRATROPIUM-ALBUTEROL 0.5-2.5 (3) MG/3ML IN SOLN
3.0000 mL | Freq: Four times a day (QID) | RESPIRATORY_TRACT | Status: DC
  Administered 2014-11-25: 3 mL via RESPIRATORY_TRACT
  Filled 2014-11-24: qty 3

## 2014-11-24 MED ORDER — LEVOFLOXACIN 500 MG PO TABS
500.0000 mg | ORAL_TABLET | Freq: Every day | ORAL | Status: DC
  Administered 2014-11-24 – 2014-11-25 (×2): 500 mg via ORAL
  Filled 2014-11-24 (×2): qty 1

## 2014-11-24 MED ORDER — METOPROLOL TARTRATE 25 MG PO TABS
25.0000 mg | ORAL_TABLET | Freq: Three times a day (TID) | ORAL | Status: DC
  Administered 2014-11-24 (×2): 25 mg via ORAL
  Filled 2014-11-24 (×2): qty 1

## 2014-11-24 NOTE — Progress Notes (Signed)
[facility] at [place] NAME: Alan Weaver    MR#:  [id number]  DATE OF BIRTH:  02-14-1927  SUBJECTIVE:  CHIEF COMPLAINT:   Chief Complaint  Patient presents with  . Shortness of Breath  on 3l o2 today. Feeling much better. Feels like going home today.  REVIEW OF SYSTEMS:  Review of Systems  Constitutional: Negative for fever and chills.  Respiratory: Positive for cough and shortness of breath. Negative for wheezing.   Cardiovascular: Negative for chest pain and palpitations.  Gastrointestinal: Negative for nausea, vomiting, abdominal pain, diarrhea and constipation.  Genitourinary: Negative for dysuria.  Neurological: Negative for dizziness, seizures and headaches.    DRUG ALLERGIES:  No Known Allergies  VITALS:  Blood pressure 172/91, pulse 125, temperature 97.5 F (36.4 C), temperature source Oral, resp. rate 21, height '5\' 6"'$  (1.676 m), weight 82.509 kg (181 lb 14.4 oz), SpO2 84 %.  PHYSICAL EXAMINATION:  Physical Exam  GENERAL:  81 y.o.-year-old patient lying in the bed with no acute distress.  EYES: Pupils equal, round, reactive to light and accommodation. No scleral icterus. Extraocular muscles intact.  HEENT: Head atraumatic, normocephalic. Oropharynx and nasopharynx clear.  NECK:  Supple, no jugular venous distention. No thyroid enlargement, no tenderness.  LUNGS: Bilateral improving breath sounds. Scant breath sounds at the bases. Scattered wheezes present. No rhonchi or rales. No use of accessory muscles for breathing. CARDIOVASCULAR: S1, S2 normal. No murmurs, rubs, or gallops.  ABDOMEN: Soft, nontender, nondistended. Bowel sounds present. No organomegaly or mass.  EXTREMITIES: No pedal edema, cyanosis, or clubbing.  NEUROLOGIC: Cranial nerves II through XII are intact. Muscle strength 5/5 in all extremities. Sensation intact. Gait not checked.  PSYCHIATRIC: The patient is alert and oriented x 3.  SKIN: No obvious  rash, lesion, or ulcer.    LABORATORY PANEL:   CBC  Recent Labs Lab 11/23/14 0416  WBC 11.8*  HGB 12.7*  HCT 39.0*  PLT 260   ------------------------------------------------------------------------------------------------------------------  Chemistries   Recent Labs Lab 11/22/14 0859 11/23/14 0416  NA 138 137  K 4.3 3.8  CL 100* 102  CO2 27 24  GLUCOSE 172* 229*  BUN 23* 27*  CREATININE 1.27* 1.28*  CALCIUM 8.9 8.4*  MG 1.6*  --   AST 18  --   ALT 11*  --   ALKPHOS 60  --   BILITOT 0.9  --    ------------------------------------------------------------------------------------------------------------------  Cardiac Enzymes  Recent Labs Lab 11/22/14 0859  TROPONINI 0.03   ------------------------------------------------------------------------------------------------------------------  RADIOLOGY:  No results found.  EKG:   Orders placed or performed during the hospital encounter of 11/22/14  . ED EKG  . ED EKG  . EKG 12-Lead  . EKG 12-Lead    ASSESSMENT AND PLAN:   Huntley Knoop is a 81 y.o. male with a known history of COPD not on any home oxygen, former smoker, history of irregular heart beat, prostate cancer or diabetes brought from home secondary to acute on chronic COPD exacerbation.  #1 acute hypoxic respiratory failure-secondary to acute on chronic COPD exacerbation. - Also community-acquired pneumonia. -Not on any home oxygen. Off Bipap, weaned to 2-3 L nasal cannula. -continue solumedrol. DuoNeb's. Continue his home inhalers. - Blood cultures are negative so far,  - change ABX to oral levaquin, f/u CXR today - wean o2 as tolerated and also check ambulatory and resting room air sats- likely patient might qualify for home oxygen  #2 Sepsis- tachycardia elevated white count. Secondary to pneumonia.  Blood cultures are negative. On IV antibiotics. -Monitor blood pressure. IV fluids started  #2 Tachycardia-known history of A. fib.  Multiple PVCs now. - echocardiogram normal EF, no acute changes. Add metoprolol  #3 diabetes mellitus-continue home medications- on metformin, HbA1c 6.4. Sliding scale insulin. Sugars are elevated now due to steroids  #4 hypertension-BP elevated, not on home meds, add metoprolol and norvasc  #5 DVT prophylaxis- on subcutaneous heparin.   Discharge possibly tomorrow with home health  All the records are reviewed and case discussed with Care Management/Social Workerr. Management plans discussed with the patient, family and they are in agreement.  CODE STATUS: Full code  TOTAL TIME TAKING CARE OF THIS PATIENT: 40 minutes.   Anjolaoluwa Siguenza M.D on 11/24/2014 at 1:32 PM  Between 7am to 6pm - Pager - [phone]  After 6pm go to [email or web address] - password [email or web address] [facility]  Office  [phone]  CC: Primary care physician; Lelon Huh, MD

## 2014-11-24 NOTE — Progress Notes (Signed)
Inpatient Diabetes Program Recommendations  AACE/ADA: New Consensus Statement on Inpatient Glycemic Control (2013)  Target Ranges:  Prepandial:   less than 140 mg/dL      Peak postprandial:   less than 180 mg/dL (1-2 hours)      Critically ill patients:  140 - 180 mg/dL   Glycemic Control review:  Results for Alan Weaver, Alan Weaver (MRN [id number]) as of 11/24/2014 09:59  Ref. Range 11/23/2014 07:23 11/23/2014 12:15 11/23/2014 16:48 11/23/2014 21:01 11/24/2014 07:26  Glucose-Capillary Latest Ref Range: 65-99 mg/dL 223 (H) 246 (H) 198 (H) 214 (H) 174 (H)   Diabetes history: Diabetes Mellitus Outpatient Diabetes medications: Metformin 500 mg daily Current orders for Inpatient glycemic control:  Novolog sensitive tid with meals and HS  While patient is on steroids, may consider increasing Novolog correction to moderate tid with meals.  Thanks, Adah Perl, RN, BC-ADM Inpatient Diabetes Coordinator Pager [phone] (8a-5p)

## 2014-11-24 NOTE — Progress Notes (Signed)
   11/24/14 1327 11/24/14 1329 11/24/14 1518  Vitals  Pulse Rate (!) 109 (!) 125 (!) 111  Pulse Rate Source Monitor Monitor Monitor  Oxygen Therapy  SpO2 95 % (at rest) (!) 84 % (with exertion) 95 % (with exertion)  O2 Device Room Air Room Air Nasal Cannula  O2 Flow Rate (L/min) --  --  3.5 L/min

## 2014-11-24 NOTE — Progress Notes (Signed)
Physical Therapy Treatment Patient Details Name: Alan Weaver MRN: [id number] DOB: 06-Oct-1926 Today's Date: 11/24/2014    History of Present Illness 81 yo male with onset SOB and COPD exacerbation, sepsis from PNA and acute respiratory failure was admitted for symptoms and referred to PT.  PMHx:  heart murmur, COPD, DM, prostate CA, a-fib    PT Comments    Pt demonstrates relative stability during ambulation with use of rolling walker today. He does become acutely short of breath during ambulation and will require home O2 at discharge. Therapist still recommends SNF placement due to poor activity tolerance, decreased strength, and poor cardiopulmonary endurance. However, pt refuss SNF at this time so please arrange [facility] PT. Pt will benefit from continued skilled PT services to address deficits in strength, balance, and mobility in order to return to full function at home.    Follow Up Recommendations  SNF;Supervision - Intermittent (Pt refusing. Please arrange [facility] PT)     Equipment Recommendations  Rolling walker with 5" wheels    Recommendations for Other Services       Precautions / Restrictions Precautions Precautions: Fall (telemetry) Restrictions Weight Bearing Restrictions: No    Mobility  Bed Mobility Overal bed mobility: Modified Independent             General bed mobility comments: HOB mildly elevated  Transfers Overall transfer level: Needs assistance Equipment used: Rolling walker (2 wheeled) Transfers: Sit to/from Stand Sit to Stand: Min guard         General transfer comment: cues for hand placement during descent for safety  Ambulation/Gait Ambulation/Gait assistance: Min assist Ambulation Distance (Feet): 20 Feet Assistive device: Rolling walker (2 wheeled)   Gait velocity: reduced   General Gait Details: Pt demonstrates reasonable gait speed during ambulation. SaO2 monitored throughout gait. Pt becomes very short of breath when he arrives at  door to room and requires return to bed. With exertion on room air SaO2 drops to 86%. Pt provided standing rest break with pursed lip breathing but SaO2 does not rebound to at or above 90%. Pt placed back on supplemental O2 at 2 L/min and SaO2 recovers to 92% and remains >90% with further exertion.    Stairs            Wheelchair Mobility    Modified Rankin (Stroke Patients Only)       Balance Overall balance assessment: Needs assistance Sitting-balance support: No upper extremity supported;Feet supported Sitting balance-Leahy Scale: Good     Standing balance support: No upper extremity supported (Pt uses bed on back of knees for support) Standing balance-Leahy Scale: Fair Standing balance comment: Pt appears to be somewhat ustable with shaky LE requiring walker to assist with balance and prevent fall                    Cognition Arousal/Alertness: Awake/alert Behavior During Therapy: WFL for tasks assessed/performed Overall Cognitive Status: Within Functional Limits for tasks assessed                      Exercises General Exercises - Lower Extremity Ankle Circles/Pumps: Strengthening;Both;15 reps;Supine Hip ABduction/ADduction: Strengthening;Both;15 reps;Seated Straight Leg Raises: Strengthening;Both;15 reps;Supine Hip Flexion/Marching: Strengthening;Both;15 reps;Seated Heel Raises: Strengthening;Both;15 reps;Seated    General Comments        Pertinent Vitals/Pain      Home Living                      Prior Function  PT Goals (current goals can now be found in the care plan section) Acute Rehab PT Goals Patient Stated Goal: to go home to his dog PT Goal Formulation: With patient Time For Goal Achievement: 12/07/14 Potential to Achieve Goals: Good Progress towards PT goals: Progressing toward goals    Frequency  Min 2X/week    PT Plan Current plan remains appropriate    Co-evaluation             End of  Session Equipment Utilized During Treatment: Gait belt Activity Tolerance: Patient limited by fatigue;Treatment limited secondary to medical complications (Comment) (O2 sats dropped and pt felt unsafe) Patient left: in bed;with call bell/phone within reach;with bed alarm set;with family/visitor present (Refuses up to recliner)     Time: 8675-4492 PT Time Calculation (min) (ACUTE ONLY): 15 min  Charges:  $Therapeutic Exercise: 8-22 mins                    G Codes:      Lyndel Safe Huprich PT, DPT   Huprich,Jason 11/24/2014, 11:40 AM

## 2014-11-24 NOTE — Care Management (Signed)
Spoke with primary nurse regarding need for qualifying 02 sats. Discussed that anticipate discharge tomorrow and need qualifying sats for 02 today.  Patient states that attending says he can go home tomorrow.  Spoke with patient and his daughter Berdie Ogren.  Physcial therapy has recommended skilled nursing but patient wants to return home.  Butch Penny says that she and her sister live close by and will be able to help patient.  He carries his cell phone in his pocket at all times.  Explained to patient and daughter of need for patient to qualify for home 02 in order for  insurance to pay for it.  Also request home nebulizer and nebulizer solution as patient says these work better than the inhalers.  Informed attending of needs for script for 02 (if qualifies), front wheeled rolling walker and home nebulizer machine and home health skilled services.    Have given heads up referral to [facility] for DME and SN PT Aide.

## 2014-11-25 ENCOUNTER — Telehealth: Payer: Self-pay | Admitting: Family Medicine

## 2014-11-25 LAB — BASIC METABOLIC PANEL
Anion gap: 6 (ref 5–15)
BUN: 32 mg/dL — ABNORMAL HIGH (ref 6–20)
CO2: 28 mmol/L (ref 22–32)
Calcium: 8.8 mg/dL — ABNORMAL LOW (ref 8.9–10.3)
Chloride: 105 mmol/L (ref 101–111)
Creatinine, Ser: 0.92 mg/dL (ref 0.61–1.24)
GFR calc non Af Amer: >60 mL/min (ref >60)
GLUCOSE: 140 mg/dL — AB (ref 65–99)
POTASSIUM: 4.6 mmol/L (ref 3.5–5.1)
SODIUM: 139 mmol/L (ref 135–145)

## 2014-11-25 LAB — GLUCOSE, CAPILLARY
GLUCOSE-CAPILLARY: 146 mg/dL — AB (ref 65–99)
Glucose-Capillary: 116 mg/dL — ABNORMAL HIGH (ref 65–99)

## 2014-11-25 MED ORDER — METOPROLOL TARTRATE 50 MG PO TABS: 50.0000 mg | ORAL_TABLET | Freq: Two times a day (BID) | ORAL | Status: DC

## 2014-11-25 MED ORDER — IPRATROPIUM-ALBUTEROL 0.5-2.5 (3) MG/3ML IN SOLN: 3.0000 mL | Freq: Four times a day (QID) | RESPIRATORY_TRACT | Status: DC

## 2014-11-25 MED ORDER — PREDNISONE 10 MG PO TABS: ORAL_TABLET | ORAL | Status: DC

## 2014-11-25 MED ORDER — METOPROLOL TARTRATE 50 MG PO TABS
50.0000 mg | ORAL_TABLET | Freq: Two times a day (BID) | ORAL | Status: DC
  Administered 2014-11-25: 50 mg via ORAL
  Filled 2014-11-25: qty 1

## 2014-11-25 MED ORDER — LEVOFLOXACIN 500 MG PO TABS: 500.0000 mg | ORAL_TABLET | Freq: Every day | ORAL | Status: DC

## 2014-11-25 MED ORDER — AMLODIPINE BESYLATE 5 MG PO TABS: 5.0000 mg | ORAL_TABLET | Freq: Every day | ORAL | Status: DC

## 2014-11-25 NOTE — Discharge Summary (Signed)
[facility] [place] NAME: Alan Weaver    MR#:  [id number]  DATE OF BIRTH:  [birth year]  DATE OF ADMISSION:  11/22/2014 ADMITTING PHYSICIAN: Gladstone Lighter, MD  DATE OF DISCHARGE: 11/25/14  PRIMARY CARE PHYSICIAN: Lelon Huh, MD    ADMISSION DIAGNOSIS:  SOB (shortness of breath) [R06.02] COPD with acute exacerbation [J44.1]  DISCHARGE DIAGNOSIS:  Principal Problem:   Acute respiratory failure Active Problems:   COPD exacerbation   Sepsis   Pneumonia   SECONDARY DIAGNOSIS:   Past Medical History  Diagnosis Date  . Heart murmur   . COPD (chronic obstructive pulmonary disease)   . High blood cholesterol level   . Irritable bowel syndrome   . Cancer 1994    prostate  . Diabetes mellitus without complication   . Osteoporosis   . Incontinence   . COPD (chronic obstructive pulmonary disease) 2000  . Atrial fibrillation     HOSPITAL COURSE:   Alan Weaver is a 81 y.o. male with a known history of COPD not on any home oxygen, former smoker, history of irregular heart beat, prostate cancer or diabetes brought from home secondary to acute on chronic COPD exacerbation.  #1 acute hypoxic respiratory failure-secondary to acute on chronic COPD exacerbation. - Also community-acquired pneumonia. -Not on any home oxygen. Off Bipap, weaned to 3 L nasal cannula. - qualifies for home oxygen -continue solumedrol. Change to prednisone at discharge. - DuoNeb's. Continue his home inhalers. - Blood cultures are negative so far,  - change ABX to oral levaquin, f/u CXR today  #2 Sepsis- tachycardia elevated white count. Secondary to pneumonia. Blood cultures are negative. On IV antibiotics. -Monitor blood pressure. IV fluids started  #2 Tachycardia-known history of A. fib. Multiple PVCs now. - echocardiogram normal EF, no acute changes. Added metoprolol  #3 diabetes mellitus-continue home medications- on metformin, HbA1c  6.4. Sliding scale insulin. Sugars are elevated now due to steroids  #4 hypertension-BP elevated, not on home meds, added metoprolol and norvasc  DISCHARGE CONDITIONS:   Stable  CONSULTS OBTAINED:   None  DRUG ALLERGIES:  No Known Allergies  DISCHARGE MEDICATIONS:   Current Discharge Medication List    START taking these medications   Details  amLODipine (NORVASC) 5 MG tablet Take 1 tablet (5 mg total) by mouth daily. Qty: 30 tablet, Refills: 1    ipratropium-albuterol (DUONEB) 0.5-2.5 (3) MG/3ML SOLN Take 3 mLs by nebulization 4 (four) times daily. Qty: 360 mL, Refills: 1    levofloxacin (LEVAQUIN) 500 MG tablet Take 1 tablet (500 mg total) by mouth daily. Qty: 5 tablet, Refills: 0    metoprolol (LOPRESSOR) 50 MG tablet Take 1 tablet (50 mg total) by mouth 2 (two) times daily. Qty: 60 tablet, Refills: 1    predniSONE (DELTASONE) 10 MG tablet 6 tabs PO x 1 day 5 tabs PO x 1 day 4 tabs PO x 1 day 3 tabs PO x 1 day 2 tabs PO x 1 day 1 tab PO x 1 day and stop Qty: 21 tablet, Refills: 0      CONTINUE these medications which have NOT CHANGED   Details  albuterol (PROVENTIL HFA;VENTOLIN HFA) 108 (90 BASE) MCG/ACT inhaler Inhale 2 puffs into the lungs every 6 (six) hours as needed for wheezing or shortness of breath.    alendronate (FOSAMAX) 70 MG tablet Take 70 mg by mouth once a week. Take on Sunday. Take with a full glass of water on an empty stomach.  aspirin EC 81 MG tablet Take 81 mg by mouth daily.    Calcium Carbonate-Vitamin D (CALCIUM + D PO) Take 600 mg by mouth 2 (two) times daily.    cholecalciferol (VITAMIN D) 1000 UNITS tablet Take 2,000 Units by mouth every morning.    Fluticasone-Salmeterol (ADVAIR DISKUS) 250-50 MCG/DOSE AEPB Inhale 1 puff into the lungs 2 (two) times daily.    Leuprolide Acetate (LUPRON IJ) Inject 1 each as directed every 3 (three) months.     metFORMIN (GLUCOPHAGE) 500 MG tablet Take 500 mg by mouth daily.    Omega-3 Fatty  Acids (FISH OIL) 1000 MG CAPS Take 2,000 mg by mouth every morning.    pravastatin (PRAVACHOL) 40 MG tablet Take 40 mg by mouth daily.    tiotropium (SPIRIVA) 18 MCG inhalation capsule Place 18 mcg into inhaler and inhale daily.         DISCHARGE INSTRUCTIONS:    1. PCP f/u in 1 week 2. Home health PT, RN, Aide  If you experience worsening of your admission symptoms, develop shortness of breath, life threatening emergency, suicidal or homicidal thoughts you must seek medical attention immediately by calling 911 or calling your MD immediately  if symptoms less severe.  You Must read complete instructions/literature along with all the possible adverse reactions/side effects for all the Medicines you take and that have been prescribed to you. Take any new Medicines after you have completely understood and accept all the possible adverse reactions/side effects.   Please note  You were cared for by a hospitalist during your hospital stay. If you have any questions about your discharge medications or the care you received while you were in the hospital after you are discharged, you can call the unit and asked to speak with the hospitalist on call if the hospitalist that took care of you is not available. Once you are discharged, your primary care physician will handle any further medical issues. Please note that NO REFILLS for any discharge medications will be authorized once you are discharged, as it is imperative that you return to your primary care physician (or establish a relationship with a primary care physician if you do not have one) for your aftercare needs so that they can reassess your need for medications and monitor your lab values.    Today   CHIEF COMPLAINT:   Chief Complaint  Patient presents with  . Shortness of Breath    VITAL SIGNS:  Blood pressure 153/75, pulse 88, temperature 97.6 F (36.4 C), temperature source Oral, resp. rate 18, height '5\' 6"'$  (1.676 m), weight  83.144 kg (183 lb 4.8 oz), SpO2 97 %.  I/O:   Intake/Output Summary (Last 24 hours) at 11/25/14 1032 Last data filed at 11/25/14 0846  Gross per 24 hour  Intake    600 ml  Output   1550 ml  Net   -950 ml    PHYSICAL EXAMINATION:   Physical Exam  GENERAL: 81 y.o.-year-old patient lying in the bed with no acute distress.  EYES: Pupils equal, round, reactive to light and accommodation. No scleral icterus. Extraocular muscles intact.  HEENT: Head atraumatic, normocephalic. Oropharynx and nasopharynx clear.  NECK: Supple, no jugular venous distention. No thyroid enlargement, no tenderness.  LUNGS: Bilateral improving breath sounds. Scant breath sounds at the bases. Scattered wheezes present. No rhonchi or rales. No use of accessory muscles for breathing. CARDIOVASCULAR: S1, S2 normal. No murmurs, rubs, or gallops.  ABDOMEN: Soft, nontender, nondistended. Bowel sounds present. No organomegaly or  mass.  EXTREMITIES: No pedal edema, cyanosis, or clubbing.  NEUROLOGIC: Cranial nerves II through XII are intact. Muscle strength 5/5 in all extremities. Sensation intact. Gait not checked.  PSYCHIATRIC: The patient is alert and oriented x 3.  SKIN: No obvious rash, lesion, or ulcer.   DATA REVIEW:   CBC  Recent Labs Lab 11/23/14 0416  WBC 11.8*  HGB 12.7*  HCT 39.0*  PLT 260    Chemistries   Recent Labs Lab 11/22/14 0859  11/25/14 0355  NA 138  < > 139  K 4.3  < > 4.6  CL 100*  < > 105  CO2 27  < > 28  GLUCOSE 172*  < > 140*  BUN 23*  < > 32*  CREATININE 1.27*  < > 0.92  CALCIUM 8.9  < > 8.8*  MG 1.6*  --   --   AST 18  --   --   ALT 11*  --   --   ALKPHOS 60  --   --   BILITOT 0.9  --   --   < > = values in this interval not displayed.  Cardiac Enzymes  Recent Labs Lab 11/22/14 0859  TROPONINI 0.03    Microbiology Results  Results for orders placed or performed during the hospital encounter of 11/22/14  Blood culture (routine x 2)     Status: None  (Preliminary result)   Collection Time: 11/22/14  9:28 AM  Result Value Ref Range Status   Specimen Description BLOOD  Final   Special Requests Normal  Final   Culture NO GROWTH 2 DAYS  Final   Report Status PENDING  Incomplete  Blood culture (routine x 2)     Status: None (Preliminary result)   Collection Time: 11/22/14  9:28 AM  Result Value Ref Range Status   Specimen Description BLOOD  Final   Special Requests Normal  Final   Culture NO GROWTH 2 DAYS  Final   Report Status PENDING  Incomplete    RADIOLOGY:  Dg Chest 2 View  11/24/2014   CLINICAL DATA:  Followup pneumonia and shortness of breath  EXAM: CHEST  2 VIEW  COMPARISON:  11/22/2014  FINDINGS: As described on prior study there is a masslike 3 cm opacity adjacent to the right hilum for which CT thorax is recommended.  Based upon the lateral view, opacity at the left base appears largely due to a large hiatal hernia. There is no pleural effusion identified. There is airspace opacity at the left base as well which is likely due to atelectasis.  IMPRESSION: Left base opacity on prior study appears largely due to hiatal hernia and associated atelectasis.  Again recommended is CT thorax preferably with contrast to evaluate masslike opacity adjacent to the right hilum. This is concerning for malignancy.   Electronically Signed   By: Skipper Cliche M.D.   On: 11/24/2014 17:20    EKG:   Orders placed or performed during the hospital encounter of 11/22/14  . ED EKG  . ED EKG  . EKG 12-Lead  . EKG 12-Lead      Management plans discussed with the patient, family and they are in agreement.  CODE STATUS:     Code Status Orders        Start     Ordered   11/22/14 1230  Full code   Continuous     11/22/14 1230      TOTAL TIME TAKING CARE OF THIS PATIENT: 38 minutes.  Gladstone Lighter M.D on 11/25/2014 at 10:32 AM  Between 7am to 6pm - Pager - [phone]  After 6pm go to [email or web address] - password [email or web address] [facility]  Office  [phone]  CC: Primary care physician; Lelon Huh, MD

## 2014-11-25 NOTE — Discharge Instructions (Signed)
°  DIET:  Cardiac diet  DISCHARGE CONDITION:  Stable  ACTIVITY:  Activity as tolerated  OXYGEN:  Home Oxygen: Yes.     Oxygen Delivery: 3 liters/min via Patient connected to nasal cannula oxygen  DISCHARGE LOCATION:  home   If you experience worsening of your admission symptoms, develop shortness of breath, life threatening emergency, suicidal or homicidal thoughts you must seek medical attention immediately by calling 911 or calling your MD immediately  if symptoms less severe.  You Must read complete instructions/literature along with all the possible adverse reactions/side effects for all the Medicines you take and that have been prescribed to you. Take any new Medicines after you have completely understood and accpet all the possible adverse reactions/side effects.   Please note  You were cared for by a hospitalist during your hospital stay. If you have any questions about your discharge medications or the care you received while you were in the hospital after you are discharged, you can call the unit and asked to speak with the hospitalist on call if the hospitalist that took care of you is not available. Once you are discharged, your primary care physician will handle any further medical issues. Please note that NO REFILLS for any discharge medications will be authorized once you are discharged, as it is imperative that you return to your primary care physician (or establish a relationship with a primary care physician if you do not have one) for your aftercare needs so that they can reassess your need for medications and monitor your lab values.

## 2014-11-25 NOTE — Telephone Encounter (Signed)
[facility] F/U. PT IS BEING DISCHARGED TODAY 11/25/14 FOR ACUTE RASPATORY FAILURE AND IS SCHEDULED ON 12/08/14. THANKS TNP

## 2014-11-25 NOTE — Care Management Note (Addendum)
Case Management Note  Patient Details  Name: Alan Weaver MRN: [id number] Date of Birth: 25-Feb-1927  Subjective/Objective:       Called Will at [facility] to update him that [name] is being discharged today and is waiting for a portable oxygen tank, a nebulizer, and a rolling walker to be delivered to him in his hospital room.  Will is currently in the middle of another DME delivery and will deliver [name]'s DME as soon as possible. Qualifying O2 Sats are stapled to the oxygen prescription.          Action/Plan:   Expected Discharge Date:     11/25/14             Expected Discharge Plan:     In-House Referral:     Discharge planning Services     Post Acute Care Choice:    Choice offered to:     DME Arranged:   oxygen, nebulizer, rolling walker [facility] Agency[facility]   [facility]  HH Arranged:   RN, PT, Aid HH Agency[facility]   [facility]  Status of Service:     Medicare Important Message Given:  Yes Date Medicare IM Given:  11/23/14 Medicare IM give by:  Joni Reining Date Additional Medicare IM Given:   11/25/14 Additional Medicare Important Message give by:   Rakan Soffer, RN  If discussed at Long Length of Stay Meetings, dates discussed:    Additional Comments:  Sokha Craker A, RN 11/25/2014, 10:26 AM

## 2014-11-25 NOTE — Progress Notes (Signed)
SATURATION QUALIFICATIONS: (This note is used to comply with regulatory documentation for home oxygen)  Patient Saturations on Room Air at Rest = 99%  Patient Saturations on Room Air while Ambulating = 84%  Patient Saturations on 3 Liters of oxygen while Ambulating = 95%  Please briefly explain why patient needs home oxygen:  Dx: COPD, Pneumonia

## 2014-11-25 NOTE — Progress Notes (Signed)
[facility] at [place] NAME: Alan Weaver    MR#:  [id number]  DATE OF BIRTH:  02/25/1927  SUBJECTIVE:  CHIEF COMPLAINT:   Chief Complaint  Patient presents with  . Shortness of Breath  awaiting to be discharged -feels better - will need home o2  REVIEW OF SYSTEMS:  Review of Systems  Constitutional: Negative for fever and chills.  Respiratory: Negative for cough, shortness of breath and wheezing.   Cardiovascular: Negative for chest pain and palpitations.  Gastrointestinal: Negative for nausea, vomiting, abdominal pain, diarrhea and constipation.  Genitourinary: Negative for dysuria.  Neurological: Negative for dizziness, seizures and headaches.    DRUG ALLERGIES:  No Known Allergies  VITALS:  Blood pressure 153/75, pulse 88, temperature 97.6 F (36.4 C), temperature source Oral, resp. rate 18, height '5\' 6"'$  (1.676 m), weight 83.144 kg (183 lb 4.8 oz), SpO2 97 %.  PHYSICAL EXAMINATION:  Physical Exam  GENERAL:  81 y.o.-year-old patient lying in the bed with no acute distress.  EYES: Pupils equal, round, reactive to light and accommodation. No scleral icterus. Extraocular muscles intact.  HEENT: Head atraumatic, normocephalic. Oropharynx and nasopharynx clear.  NECK:  Supple, no jugular venous distention. No thyroid enlargement, no tenderness.  LUNGS: Bilateral improving breath sounds. Scant breath sounds at the bases. Scattered wheezes present. No rhonchi or rales. No use of accessory muscles for breathing. CARDIOVASCULAR: S1, S2 normal. No murmurs, rubs, or gallops.  ABDOMEN: Soft, nontender, nondistended. Bowel sounds present. No organomegaly or mass.  EXTREMITIES: No pedal edema, cyanosis, or clubbing.  NEUROLOGIC: Cranial nerves II through XII are intact. Muscle strength 5/5 in all extremities. Sensation intact. Gait not checked.  PSYCHIATRIC: The patient is alert and oriented x 3.  SKIN: No obvious rash, lesion, or  ulcer.    LABORATORY PANEL:   CBC  Recent Labs Lab 11/23/14 0416  WBC 11.8*  HGB 12.7*  HCT 39.0*  PLT 260   ------------------------------------------------------------------------------------------------------------------  Chemistries   Recent Labs Lab 11/22/14 0859  11/25/14 0355  NA 138  < > 139  K 4.3  < > 4.6  CL 100*  < > 105  CO2 27  < > 28  GLUCOSE 172*  < > 140*  BUN 23*  < > 32*  CREATININE 1.27*  < > 0.92  CALCIUM 8.9  < > 8.8*  MG 1.6*  --   --   AST 18  --   --   ALT 11*  --   --   ALKPHOS 60  --   --   BILITOT 0.9  --   --   < > = values in this interval not displayed. ------------------------------------------------------------------------------------------------------------------  Cardiac Enzymes  Recent Labs Lab 11/22/14 0859  TROPONINI 0.03   ------------------------------------------------------------------------------------------------------------------  RADIOLOGY:  Dg Chest 2 View  11/24/2014   CLINICAL DATA:  Followup pneumonia and shortness of breath  EXAM: CHEST  2 VIEW  COMPARISON:  11/22/2014  FINDINGS: As described on prior study there is a masslike 3 cm opacity adjacent to the right hilum for which CT thorax is recommended.  Based upon the lateral view, opacity at the left base appears largely due to a large hiatal hernia. There is no pleural effusion identified. There is airspace opacity at the left base as well which is likely due to atelectasis.  IMPRESSION: Left base opacity on prior study appears largely due to hiatal hernia and associated atelectasis.  Again recommended is CT thorax preferably with contrast  to evaluate masslike opacity adjacent to the right hilum. This is concerning for malignancy.   Electronically Signed   By: Skipper Cliche M.D.   On: 11/24/2014 17:20    EKG:   Orders placed or performed during the hospital encounter of 11/22/14  . ED EKG  . ED EKG  . EKG 12-Lead  . EKG 12-Lead    ASSESSMENT AND PLAN:    Alan Weaver is a 81 y.o. male with a known history of COPD not on any home oxygen, former smoker, history of irregular heart beat, prostate cancer or diabetes brought from home secondary to acute on chronic COPD exacerbation.  #1 acute hypoxic respiratory failure-secondary to acute on chronic COPD exacerbation. - Also community-acquired pneumonia. -Not on any home oxygen. Off Bipap, weaned to 3 L nasal cannula. - qualifies for home oxygen -continue solumedrol. Change to prednisone at discharge. - DuoNeb's. Continue his home inhalers. - Blood cultures are negative so far,  - change ABX to oral levaquin, f/u CXR today  #2 Sepsis- tachycardia elevated white count. Secondary to pneumonia. Blood cultures are negative. On IV antibiotics. -Monitor blood pressure. IV fluids started  #2 Tachycardia-known history of A. fib. Multiple PVCs now. - echocardiogram normal EF, no acute changes. Added metoprolol  #3 diabetes mellitus-continue home medications- on metformin, HbA1c 6.4. Sliding scale insulin. Sugars are elevated now due to steroids  #4 hypertension-BP elevated, not on home meds, added metoprolol and norvasc  #5 DVT prophylaxis- on subcutaneous heparin.   Discharge today  All the records are reviewed and case discussed with Care Management/Social Workerr. Management plans discussed with the patient, family and they are in agreement.  CODE STATUS: Full code  TOTAL TIME TAKING CARE OF THIS PATIENT: 40 minutes.   Gladstone Lighter M.D on 11/25/2014 at 10:11 AM  Between 7am to 6pm - Pager - [phone]  After 6pm go to [email or web address] - password [email or web address] [facility]  Office  [phone]  CC: Primary care physician; Lelon Huh, MD

## 2014-11-25 NOTE — Progress Notes (Signed)
Patient is discharge home in a stable with 02nc 3 lits, summary and f/u care given to both pt's and daughter , verbalised understanding

## 2014-11-25 NOTE — Care Management Note (Signed)
Case Management Note  Patient Details  Name: ONEAL SCHOENBERGER MRN: [id number] Date of Birth: 1926-09-09  Subjective/Objective:         Faxed request for home health PT, RN, Aid to [facility].  Mr Sarver had previously chosen [facility] from the list of home health providers.          Action/Plan:   Expected Discharge Date:                  Expected Discharge Plan:     In-House Referral:     Discharge planning Services     Post Acute Care Choice:    Choice offered to:     DME Arranged:    DME Agency:     HH Arranged:    [place] Agency:     Status of Service:     Medicare Important Message Given:  Yes Date Medicare IM Given:  11/23/14 Medicare IM give by:  Joni Reining Date Additional Medicare IM Given:    Additional Medicare Important Message give by:     If discussed at [place] of Stay Meetings, dates discussed:    Additional Comments:  Anab Vivar A, RN 11/25/2014, 11:42 AM

## 2014-11-27 LAB — CULTURE, BLOOD (ROUTINE X 2)
CULTURE: NO GROWTH
Culture: NO GROWTH
SPECIAL REQUESTS: NORMAL
Special Requests: NORMAL

## 2014-12-02 ENCOUNTER — Other Ambulatory Visit: Payer: Self-pay

## 2014-12-02 DIAGNOSIS — R0602 Shortness of breath: Secondary | ICD-10-CM

## 2014-12-02 NOTE — Telephone Encounter (Signed)
Patient requesting a RX for a portable oxygen machine (small mini) be faxed to [facility] [phone].

## 2014-12-03 DIAGNOSIS — J441 Chronic obstructive pulmonary disease with (acute) exacerbation: Secondary | ICD-10-CM | POA: Diagnosis not present

## 2014-12-03 DIAGNOSIS — J189 Pneumonia, unspecified organism: Secondary | ICD-10-CM | POA: Diagnosis not present

## 2014-12-03 DIAGNOSIS — E119 Type 2 diabetes mellitus without complications: Secondary | ICD-10-CM | POA: Diagnosis not present

## 2014-12-03 DIAGNOSIS — I10 Essential (primary) hypertension: Secondary | ICD-10-CM | POA: Diagnosis not present

## 2014-12-04 NOTE — Telephone Encounter (Signed)
Since oxygen levels in the office are not low enough to justify oxygen need, recommend referral to pulmonologist to get further evaluation of shortness of breath to get insurance to cover oxygen at home.

## 2014-12-07 NOTE — Telephone Encounter (Signed)
Patient advised as directed below. Patient verbalized understanding and requested that we proceed with pulmonology referral. Referral ordered.

## 2014-12-08 ENCOUNTER — Encounter: Payer: Self-pay | Admitting: Family Medicine

## 2014-12-08 ENCOUNTER — Ambulatory Visit (INDEPENDENT_AMBULATORY_CARE_PROVIDER_SITE_OTHER): Payer: Commercial Managed Care - HMO | Admitting: Family Medicine

## 2014-12-08 VITALS — BP 120/58 | HR 70 | Temp 98.5°F | Resp 18 | Wt 175.0 lb

## 2014-12-08 DIAGNOSIS — E1142 Type 2 diabetes mellitus with diabetic polyneuropathy: Secondary | ICD-10-CM | POA: Insufficient documentation

## 2014-12-08 DIAGNOSIS — R918 Other nonspecific abnormal finding of lung field: Secondary | ICD-10-CM | POA: Diagnosis not present

## 2014-12-08 DIAGNOSIS — K5790 Diverticulosis of intestine, part unspecified, without perforation or abscess without bleeding: Secondary | ICD-10-CM | POA: Insufficient documentation

## 2014-12-08 DIAGNOSIS — L299 Pruritus, unspecified: Secondary | ICD-10-CM | POA: Insufficient documentation

## 2014-12-08 DIAGNOSIS — J441 Chronic obstructive pulmonary disease with (acute) exacerbation: Secondary | ICD-10-CM | POA: Diagnosis not present

## 2014-12-08 DIAGNOSIS — M810 Age-related osteoporosis without current pathological fracture: Secondary | ICD-10-CM | POA: Insufficient documentation

## 2014-12-08 DIAGNOSIS — J309 Allergic rhinitis, unspecified: Secondary | ICD-10-CM | POA: Insufficient documentation

## 2014-12-08 DIAGNOSIS — C61 Malignant neoplasm of prostate: Secondary | ICD-10-CM | POA: Insufficient documentation

## 2014-12-08 DIAGNOSIS — J439 Emphysema, unspecified: Secondary | ICD-10-CM | POA: Insufficient documentation

## 2014-12-08 DIAGNOSIS — E782 Mixed hyperlipidemia: Secondary | ICD-10-CM | POA: Insufficient documentation

## 2014-12-08 NOTE — Progress Notes (Signed)
Patient: Alan Weaver Male    DOB: 06/24/1927   81 y.o.   MRN: [id number] Visit Date: 12/08/2014  Today's Provider: Lelon Huh, MD   No chief complaint on file.  Subjective:    Shortness of Breath This is a chronic problem. The current episode started in the past 7 days. The problem occurs daily. The problem has been gradually improving. Associated symptoms include leg swelling, sputum production and wheezing. Pertinent negatives include no abdominal pain, chest pain or headaches. The symptoms are aggravated by any activity.       Follow up Hospitalization  Patient was admitted to [facility] on 11/22/2014 and discharged on 11/25/2014. He was treated for Acute Respiratory Failure.due to COPD exacerbation He was discharged on O2 and 6 day prednisone taper.  He reports good compliance with treatment. He reports this condition is Improved. He is using is nebulizer much less frequently since being put on oxygen, and is still using Spiriva and Advair regularly.   Discharge instructions include: f/u with pcp in one week. Home Health PT and RN Aid. His wife reports oximetry was done at home and drops into the 70s with exertion. He currently has a large oxygen concentrator which is limiting his mobility and he is having a hard time getting around.   Chest Xr report from hospitalization discussed presence of right hilar mass concerning for malignancy, and it was recommend he have CT chest with contrast.  ------------------------------------------------------------------------------------  Previous Medications   ALBUTEROL (PROVENTIL HFA;VENTOLIN HFA) 108 (90 BASE) MCG/ACT INHALER    Inhale 2 puffs into the lungs every 6 (six) hours as needed for wheezing or shortness of breath.   ALENDRONATE (FOSAMAX) 70 MG TABLET    Take 70 mg by mouth once a week. Take on Sunday. Take with a full glass of water on an empty stomach.   AMLODIPINE (NORVASC) 5 MG TABLET    Take 1 tablet (5 mg total) by mouth daily.    ASPIRIN EC 81 MG TABLET    Take 81 mg by mouth daily.   CALCIUM CARBONATE-VITAMIN D (CALCIUM + D PO)    Take 600 mg by mouth 2 (two) times daily.   CHOLECALCIFEROL (VITAMIN D) 1000 UNITS TABLET    Take 2,000 Units by mouth every morning.   FLUTICASONE-SALMETEROL (ADVAIR DISKUS) 250-50 MCG/DOSE AEPB    Inhale 1 puff into the lungs 2 (two) times daily.   IPRATROPIUM-ALBUTEROL (DUONEB) 0.5-2.5 (3) MG/3ML SOLN    Take 3 mLs by nebulization 4 (four) times daily.   LEUPROLIDE ACETATE (LUPRON IJ)    Inject 1 each as directed every 3 (three) months.    METFORMIN (GLUCOPHAGE) 500 MG TABLET    Take 500 mg by mouth daily.   METOPROLOL (LOPRESSOR) 50 MG TABLET    Take 1 tablet (50 mg total) by mouth 2 (two) times daily.   OMEGA-3 FATTY ACIDS (FISH OIL) 1000 MG CAPS    Take 2,000 mg by mouth every morning.   PRAVASTATIN (PRAVACHOL) 40 MG TABLET    Take 40 mg by mouth daily.   PREDNISONE (DELTASONE) 10 MG TABLET    6 tabs PO x 1 day 5 tabs PO x 1 day 4 tabs PO x 1 day 3 tabs PO x 1 day 2 tabs PO x 1 day 1 tab PO x 1 day and stop   TIOTROPIUM (SPIRIVA) 18 MCG INHALATION CAPSULE    Place 18 mcg into inhaler and inhale daily.    Review of Systems  Constitutional:  Positive for fatigue.  Respiratory: Positive for sputum production, shortness of breath and wheezing.   Cardiovascular: Positive for leg swelling. Negative for chest pain.  Gastrointestinal: Negative for abdominal pain.  Neurological: Negative for headaches.    History  Substance Use Topics  . Smoking status: Former Smoker -- 1.00 packs/day    Types: Cigarettes    Quit date: 05/15/2006  . Smokeless tobacco: Never Used  . Alcohol Use: No   Objective:   BP 120/58 mmHg  Pulse 70  Temp(Src) 98.5 F (36.9 C) (Oral)  Resp 18  Wt 175 lb (79.379 kg)  SpO2 93%  Physical Exam   General Appearance:    Alert, cooperative, no distress  Eyes:    PERRL, conjunctiva/corneas clear, EOM's intact       Lungs:     Clear to auscultation  bilaterally, respirations unlabored. Faint breath sounds.   Heart:    Regular rate and rhythm. No edema  Neurologic:   Awake, alert, oriented x 3. No apparent focal neurological           defect.           Assessment & Plan:     1. COPD exacerbation Greatly improved, doing well with initiation of supplemental oxygen. Family reports home health has been measuring o2 saturations. Order given to pt for portable oxygen concentrator. He is to continue O2 when ambulating as oximetry measured by Home Health dropped into the 70s when patient was walking.   2. Lung mass Right hilar mass on recent Chest XR from [facility].  - CT Chest W Contrast; Future

## 2014-12-10 ENCOUNTER — Encounter: Payer: Self-pay | Admitting: Family Medicine

## 2014-12-14 ENCOUNTER — Ambulatory Visit
Admission: RE | Admit: 2014-12-14 | Discharge: 2014-12-14 | Disposition: A | Discharge status: Home / Self Care | Payer: Commercial Managed Care - HMO | Source: Ambulatory Visit | Attending: Family Medicine | Admitting: Family Medicine

## 2014-12-14 DIAGNOSIS — I2510 Atherosclerotic heart disease of native coronary artery without angina pectoris: Secondary | ICD-10-CM | POA: Diagnosis not present

## 2014-12-14 DIAGNOSIS — R918 Other nonspecific abnormal finding of lung field: Secondary | ICD-10-CM | POA: Diagnosis not present

## 2014-12-14 MED ORDER — IOHEXOL 300 MG/ML  SOLN
75.0000 mL | Freq: Once | INTRAMUSCULAR | Status: AC | PRN
  Administered 2014-12-14: 75 mL via INTRAVENOUS

## 2014-12-19 ENCOUNTER — Other Ambulatory Visit: Payer: Self-pay | Admitting: Family Medicine

## 2014-12-24 ENCOUNTER — Ambulatory Visit (INDEPENDENT_AMBULATORY_CARE_PROVIDER_SITE_OTHER): Payer: Commercial Managed Care - HMO | Admitting: Internal Medicine

## 2014-12-24 ENCOUNTER — Telehealth: Payer: Self-pay

## 2014-12-24 ENCOUNTER — Encounter: Payer: Self-pay | Admitting: Internal Medicine

## 2014-12-24 VITALS — BP 128/70 | HR 72 | Ht 70.5 in | Wt 176.0 lb

## 2014-12-24 DIAGNOSIS — R0902 Hypoxemia: Secondary | ICD-10-CM | POA: Diagnosis not present

## 2014-12-24 DIAGNOSIS — R918 Other nonspecific abnormal finding of lung field: Secondary | ICD-10-CM

## 2014-12-24 DIAGNOSIS — J431 Panlobular emphysema: Secondary | ICD-10-CM | POA: Diagnosis not present

## 2014-12-24 DIAGNOSIS — R0600 Dyspnea, unspecified: Secondary | ICD-10-CM

## 2014-12-24 DIAGNOSIS — J449 Chronic obstructive pulmonary disease, unspecified: Secondary | ICD-10-CM | POA: Insufficient documentation

## 2014-12-24 MED ORDER — FLUTICASONE-SALMETEROL 500-50 MCG/DOSE IN AEPB
1.0000 | INHALATION_SPRAY | Freq: Two times a day (BID) | RESPIRATORY_TRACT | Status: DC
Start: 2014-12-24 — End: 2014-12-24

## 2014-12-24 MED ORDER — FLUTICASONE-SALMETEROL 500-50 MCG/DOSE IN AEPB: 1.0000 | INHALATION_SPRAY | Freq: Two times a day (BID) | RESPIRATORY_TRACT | Status: DC

## 2014-12-24 NOTE — Telephone Encounter (Signed)
Called pt to advise him of results. Patient was unable to talk on the phone at that time because he was at another doctors office seeing the doctor (pulmonologist). Patient says he would call back after his appointment to receive the results.

## 2014-12-24 NOTE — Progress Notes (Signed)
Called patient to advise. Patient was unavailable due to currently being seen by his Pulmonologist at the time of my call. Patient states  he would call back after he gets out of the office.

## 2014-12-24 NOTE — Assessment & Plan Note (Signed)
New onset since recent hospitalization.   I believe his need for oxygen is multifactorial: Recent pneumonia, atelectasis, severe emphysema , lung mass , and deconditioning.     Plan:  continue with supplemental oxygen to maintain saturations greater than 88%   patient has follow-up with DME company for O2 evaluation

## 2014-12-24 NOTE — Patient Instructions (Signed)
Follow-up with Dr.Jumar Greenstreet in 1 month - 3.3 x 4 cm lung mass on the right side that will be further evaluated at [place] 500/50 -1 puff in the morning, 1 puff in the evening, please gargle and rinse after each use. -Continue Spiriva - albuterol inhaler - 2puff every 3-4 hours as needed for shortness of breath\wheezing\recurrent cough (RESCUE MED) - Albuterol nebulizer-one nebulized treatment as needed for shortness of breath/wheezing/recurrent cough every 4 hours (RESUCE MED) - Portable oxygen concentrator, 2 L with exertion - after your follow-up with oncology, we will decide on further evaluation.

## 2014-12-24 NOTE — Telephone Encounter (Signed)
-----   Message from Margarita Rana, MD sent at 12/24/2014  2:35 PM EDT ----- Still with mass noticeable on CT scan. Can not be sure without assessment by Oncology.  Please notify patient and put in order for Oncology referral ASAP. Thanks.

## 2014-12-24 NOTE — Assessment & Plan Note (Signed)
Multifactorial: COPD, deconditioning, lung mass, atelectasis, advanced age , severe emphysema.

## 2014-12-24 NOTE — Assessment & Plan Note (Signed)
Patient with known COPD. Currently on Advair, Spiriva, albuterol nebulizer, albuterol inhaler.  since discharge from the hospital he has been using his albuterol nebulizer every 4 hour,  Because he thought past with a prescription was written for ; he actually does not require it clinically.  Review of patient's medications with him and explained maintenance versus rescue inhalers (medications ).  at this time he has significant amount of emphysema as noted on his CAT scan , will optimized by increasing Advair to 500/50 pulmonary  Plan: -Advair 500/50, 1 puff twice a day , gargle and rinse after each use. -continue with supplemental oxygen, 2 L with exertion, patient has evaluation with DME company. -Continue with Spiriva  -continue ambulation as tolerated  -Avoid any forms of secondhand smoke, or any type of tobacco

## 2014-12-24 NOTE — Progress Notes (Signed)
Date: 12/24/2014  MRN# [id number] Alan Weaver 1926-12-08  Referring Physician:  Dr. Lelon Huh Alan Weaver is a 81 y.o. old male seen in consultation for abnormal CT Scan  CC:  Chief Complaint  Patient presents with  . Advice Only    Pt referred for sob. He currently uses 02 2 L with exertion. DME [facility]: pt has chest tightness and wheezing in the am. Pt denies cough.    HPI:   Patient is a pleasant 81 year old male seen in consultation today for abnormal CT scan, and COPD  Optimization. He is also accompanied by his daughter. Patient states that he's had COPD for a very long time, previously smoked 2 packs per day for 50 years, quit in 2006 ; he is also a Psychologist, sport and exercise. patient was recently hospitalized for pneumonia at the end of May , since then has been on supplemental oxygen , 2 L with exertion. Today we'll from the lobby to the patient room he desatted down to 85%. Patient also stated he recently had teeth pulled in his right and left upper jaw. Today he admits to chronic shortness of breath , mild cough , usually in the morning, mildly productive with clear sputum.  Patient also had a recent CT scan that shows a right hilar mass ; patient has a history of prostate cancer followed by [facility] oncology. Patient denies rapid weight loss or night sweats.  PMHX:   Past Medical History  Diagnosis Date  . Heart murmur   . COPD (chronic obstructive pulmonary disease)   . High blood cholesterol level   . Irritable bowel syndrome   . Diabetes mellitus without complication   . Osteoporosis   . Incontinence   . COPD (chronic obstructive pulmonary disease) 2000  . Atrial fibrillation   . Cancer 1994    prostate   Surgical Hx:  Past Surgical History  Procedure Laterality Date  . Intraocular lens insertion  2002  . Colonoscopy  03/12/13  . Prostate surgery  1994  . Hernia repair  2011  . Surgery for staph infection  1995  . Colonoscopy  2011  . Bone density test      Spine  T-spine = -1.7   Family Hx:  Family History  Problem Relation Age of Onset  . Cancer Father     bladder  . Other Sister     brain tumor  . Cancer Brother     colon   Social Hx:   History  Substance Use Topics  . Smoking status: Former Smoker -- 1.00 packs/day    Types: Cigarettes    Quit date: 05/15/2006  . Smokeless tobacco: Never Used  . Alcohol Use: No   Medication:   Current Outpatient Rx  Name  Route  Sig  Dispense  Refill  . albuterol (PROVENTIL HFA;VENTOLIN HFA) 108 (90 BASE) MCG/ACT inhaler   Inhalation   Inhale 2 puffs into the lungs every 6 (six) hours as needed for wheezing or shortness of breath.         [name] alendronate (FOSAMAX) 70 MG tablet   Oral   Take 70 mg by mouth once a week. Take on Sunday. Take with a full glass of water on an empty stomach.         [name] amLODipine (NORVASC) 5 MG tablet   Oral   Take 1 tablet (5 mg total) by mouth daily.   30 tablet   1   . aspirin EC 81 MG tablet   Oral  Take 81 mg by mouth daily.         . Calcium Carbonate-Vitamin D (CALCIUM + D PO)   Oral   Take 600 mg by mouth 2 (two) times daily.         . cholecalciferol (VITAMIN D) 1000 UNITS tablet   Oral   Take 2,000 Units by mouth every morning.         . Fluticasone-Salmeterol (ADVAIR DISKUS) 250-50 MCG/DOSE AEPB   Inhalation   Inhale 1 puff into the lungs 2 (two) times daily.         [name] ipratropium-albuterol (DUONEB) 0.5-2.5 (3) MG/3ML SOLN   Nebulization   Take 3 mLs by nebulization 4 (four) times daily.   360 mL   1   . Leuprolide Acetate (LUPRON IJ)   Injection   Inject 1 each as directed every 3 (three) months.          . metFORMIN (GLUCOPHAGE) 500 MG tablet      TAKE ONE TABLET BY MOUTH ONCE DAILY   30 tablet   6   . metoprolol (LOPRESSOR) 50 MG tablet   Oral   Take 1 tablet (50 mg total) by mouth 2 (two) times daily.   60 tablet   1   . Omega-3 Fatty Acids (FISH OIL) 1000 MG CAPS   Oral   Take 2,000 mg by mouth every  morning.         . pravastatin (PRAVACHOL) 40 MG tablet   Oral   Take 40 mg by mouth daily.         . predniSONE (DELTASONE) 10 MG tablet      6 tabs PO x 1 day 5 tabs PO x 1 day 4 tabs PO x 1 day 3 tabs PO x 1 day 2 tabs PO x 1 day 1 tab PO x 1 day and stop Patient not taking: Reported on 12/08/2014   21 tablet   0   . tiotropium (SPIRIVA) 18 MCG inhalation capsule   Inhalation   Place 18 mcg into inhaler and inhale daily.             Allergies:  Review of patient's allergies indicates no known allergies.  Review of Systems: Gen:  Denies  fever, sweats, chills HEENT: Denies blurred vision, double vision, ear pain, eye pain, hearing loss, nose bleeds, sore throat Cvc:  No dizziness, chest pain or heaviness Resp:    Mild cough and chronic shortness of breath Gi: Denies swallowing difficulty, stomach pain, nausea or vomiting, diarrhea, constipation, bowel incontinence Gu:  Denies bladder incontinence, burning urine Ext:   No Joint pain, stiffness or swelling Skin: No skin rash, easy bruising or bleeding or hives Endoc:  No polyuria, polydipsia , polyphagia or weight change Psych: No depression, insomnia or hallucinations  Other:  All other systems negative  Physical Examination:   VS: BP 128/70 mmHg  Pulse 72  Ht 5' 10.5" (1.791 m)  Wt 176 lb (79.833 kg)  BMI 24.89 kg/m2  SpO2 92%  General Appearance: No distress  Neuro:without focal findings, mental status, speech normal, alert and oriented, cranial nerves 2-12 intact, reflexes normal and symmetric, sensation grossly normal  HEENT: PERRLA, EOM intact, no ptosis, no other lesions noticed; Mallampati 2 Pulmonary: normal breath sounds., diaphragmatic excursion normal.No wheezing, No rales;   Sputum Production:  none CardiovascularNormal S1,S2.  No m/r/g.  Abdominal aorta pulsation normal.    Abdomen: Benign, Soft, non-tender, No masses, hepatosplenomegaly, No lymphadenopathy Renal:  No costovertebral  tenderness   GU:  No performed at this time. Endoc: No evident thyromegaly, no signs of acromegaly or Cushing features Skin:   warm, no rashes, no ecchymosis  Extremities: normal, no cyanosis, clubbing, no edema, warm with normal capillary refill. Other findings: none   Rad results: (The following images and results were reviewed by Dr. Stevenson Clinch). 12/09/14 CT CHEST WITH CONTRAST  TECHNIQUE: Multidetector CT imaging of the chest was performed during intravenous contrast administration.  CONTRAST: 50m OMNIPAQUE IOHEXOL 300 MG/ML SOLN  COMPARISON: Abdominal CT 12/21/2006 and chest x-ray 11/24/2014  FINDINGS: The lungs are adequately inflated with moderate bilateral emphysematous disease worse over the superior segment left lower lobe. There is a spiculated mass over the most inferior aspect of the right upper lobe adjacent the minor fissure measuring 3.3 x 4 cm in transverse in AP dimensions. This concerning for a primary bronchogenic malignancy. There is a 4 mm triangular opacity along the minor fissure. There is a 2-3 mm calcified granuloma over the lateral aspect of the right upper lobe. Minimal linear scarring over the lung bases. There is a 1.7 x 2 cm nodular opacity over the lingula with somewhat linear component as this may represent rounded atelectasis, although cannot exclude a metastatic lesion. There is subtle patchy micro nodularity over the left base. No evidence of pleural effusion. Airways are within normal.  Heart is normal in size. There is mild calcified plaque over the left main, left anterior descending and lateral circumflex coronary arteries. There is calcified plaque over the aortic arch. There is a 1 cm right hilar lymph node. There is no significant mediastinal or axillary adenopathy. There is a stable focal moderate size dilatation of the distal esophagus containing moderate ingested material as this is more typical of an epiphrenic diverticulum and not a  hiatal hernia.  Images through the upper abdomen demonstrate a stable lymph node over the gastrohepatic ligament. Findings suggesting gallbladder sludge versus hepatobiliary contrast excretion.  There are degenerative changes of the spine.  IMPRESSION: Spiculated mass over the inferior aspect of the right upper lobe measuring 3.3 x 4 cm concerning for primary bronchogenic malignancy. 2 cm nodular focus over the lingula with associated linear component which may represent rounded atelectasis although cannot exclude a metastatic focus. 1 cm right hilar lymph node. No significant mediastinal or axillary adenopathy.  Triangular nodular density over the right minor fissure likely benign. Evidence of prior granulomas disease. Subtle patchy micronodular density over the left lower lobe which may be due to inflammatory or infectious/atypical infectious process. Recommend attention to these areas on follow-up CT.  Moderate emphysematous disease.  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  Atherosclerotic coronary artery disease.  ECHO 11/23/14 Study Conclusions  - Left ventricle: The cavity size was normal. Systolic function was normal. The estimated ejection fraction was in the range of 60% to 65%. Doppler parameters are consistent with abnormal left ventricular relaxation (grade 1 diastolic dysfunction). - Atrial septum: No defect or patent foramen ovale was identified.  Impressions:  - Normal chamber size and normal LVEF , with mild diastolic dysfunction and trace MR, and mild TR.   Assessment and Plan: 81year old male seen in consultation for COPD optimization, and lung mass on recent CT chest COPD (chronic obstructive pulmonary disease)  Patient with known COPD. Currently on Advair, Spiriva, albuterol nebulizer, albuterol inhaler.  since  discharge from the hospital he has been using his albuterol nebulizer  every 4 hour,  Because he thought past with a prescription was written for ; he actually does not require it clinically.  Review of patient's medications with him and explained maintenance versus rescue inhalers (medications ).  at this time he has significant amount of emphysema as noted on his CAT scan , will optimized by increasing Advair to 500/50 pulmonary  Plan: -Advair 500/50, 1 puff twice a day , gargle and rinse after each use. -continue with supplemental oxygen, 2 L with exertion, patient has evaluation with DME company. -Continue with Spiriva  -continue ambulation as tolerated  -Avoid any forms of secondhand smoke, or any type of tobacco  Hypoxia  New onset since recent hospitalization.   I believe his need for oxygen is multifactorial: Recent pneumonia, atelectasis, severe emphysema , lung mass , and deconditioning.     Plan:  continue with supplemental oxygen to maintain saturations greater than 88%   patient has follow-up with DME company for O2 evaluation  Lung mass  Right upper lobe spiculated mass 3.3 x 4 cm  4 mm opacity along right minor fissure  1.7 by 2 cm nodular opacity over the lingular ( may represent rounded  Atelectasis)   1 cm right hilar lymph node   Patient with known history of pancreatic cancer, high suspicion for metastases along with primary lung malignancy.  Further evaluation as dictated by [facility] oncology.  If patient requests further diagnostic workup , may need to consider PET scan followed by bronchoscopy for biopsy.  Dyspnea  Multifactorial: COPD, deconditioning, lung mass, atelectasis, advanced age , severe emphysema.    Updated Medication List Outpatient Encounter Prescriptions as of 12/24/2014  Medication Sig  . albuterol (PROVENTIL HFA;VENTOLIN HFA) 108 (90 BASE) MCG/ACT inhaler Inhale 2 puffs into the lungs every 6 (six) hours as needed for wheezing or shortness of  breath.  [name] alendronate (FOSAMAX) 70 MG tablet Take 70 mg by mouth once a week. Take on Sunday. Take with a full glass of water on an empty stomach.  [name] amLODipine (NORVASC) 5 MG tablet Take 1 tablet (5 mg total) by mouth daily.  [name] aspirin EC 81 MG tablet Take 81 mg by mouth daily.  . Calcium Carbonate-Vitamin D (CALCIUM + D PO) Take 600 mg by mouth 2 (two) times daily.  . cholecalciferol (VITAMIN D) 1000 UNITS tablet Take 2,000 Units by mouth every morning.  . Fluticasone-Salmeterol (ADVAIR DISKUS) 250-50 MCG/DOSE AEPB Inhale 1 puff into the lungs 2 (two) times daily.  [name] ipratropium-albuterol (DUONEB) 0.5-2.5 (3) MG/3ML SOLN Take 3 mLs by nebulization 4 (four) times daily.  [name] Leuprolide Acetate (LUPRON IJ) Inject 1 each as directed every 3 (three) months.   . metFORMIN (GLUCOPHAGE) 500 MG tablet TAKE ONE TABLET BY MOUTH ONCE DAILY  . metoprolol (LOPRESSOR) 50 MG tablet Take 1 tablet (50 mg total) by mouth 2 (two) times daily.  . Omega-3 Fatty Acids (FISH OIL) 1000 MG CAPS Take 2,000 mg by mouth every morning.  . pravastatin (PRAVACHOL) 40 MG tablet Take 40 mg by mouth daily.  . predniSONE (DELTASONE) 10 MG tablet 6 tabs PO x 1 day 5 tabs PO x 1 day 4 tabs PO x 1 day 3 tabs PO x 1 day 2 tabs PO x 1 day 1 tab PO x 1 day and stop (Patient not taking: Reported on 12/08/2014)  . tiotropium (SPIRIVA) 18 MCG inhalation capsule Place 18 mcg into inhaler and inhale daily.   No facility-administered encounter medications on file as of 12/24/2014.  Orders for this visit: No orders of the defined types were placed in this encounter.     Thank  you for the consultation and for allowing [facility] to assist in the care of your patient. Our recommendations are noted above.  Please contact us if we can be of further service.   Vilinda Boehringer, MD [facility] Office Number: [phone]

## 2014-12-24 NOTE — Assessment & Plan Note (Signed)
Right upper lobe spiculated mass 3.3 x 4 cm  4 mm opacity along right minor fissure  1.7 by 2 cm nodular opacity over the lingular ( may represent rounded  Atelectasis)   1 cm right hilar lymph node   Patient with known history of pancreatic cancer, high suspicion for metastases along with primary lung malignancy.  Further evaluation as dictated by [facility] oncology.  If patient requests further diagnostic workup , may need to consider PET scan followed by bronchoscopy for biopsy.

## 2014-12-25 NOTE — Telephone Encounter (Signed)
Spoke with patient and advised him of the results. Patient states his Pulmonologist advised him yesterday of the results and they are working on referring patient to the Oncologist.

## 2014-12-30 ENCOUNTER — Encounter: Payer: Self-pay | Admitting: Oncology

## 2014-12-30 ENCOUNTER — Inpatient Hospital Stay: Payer: Commercial Managed Care - HMO | Attending: Oncology | Admitting: Oncology

## 2014-12-30 VITALS — BP 151/74 | HR 54 | Temp 95.7°F | Wt 175.3 lb

## 2014-12-30 DIAGNOSIS — M818 Other osteoporosis without current pathological fracture: Secondary | ICD-10-CM | POA: Diagnosis not present

## 2014-12-30 DIAGNOSIS — Z87891 Personal history of nicotine dependence: Secondary | ICD-10-CM | POA: Diagnosis not present

## 2014-12-30 DIAGNOSIS — I4891 Unspecified atrial fibrillation: Secondary | ICD-10-CM | POA: Insufficient documentation

## 2014-12-30 DIAGNOSIS — R918 Other nonspecific abnormal finding of lung field: Secondary | ICD-10-CM

## 2014-12-30 DIAGNOSIS — E785 Hyperlipidemia, unspecified: Secondary | ICD-10-CM | POA: Diagnosis not present

## 2014-12-30 DIAGNOSIS — Z79818 Long term (current) use of other agents affecting estrogen receptors and estrogen levels: Secondary | ICD-10-CM | POA: Diagnosis not present

## 2014-12-30 DIAGNOSIS — Z8052 Family history of malignant neoplasm of bladder: Secondary | ICD-10-CM

## 2014-12-30 DIAGNOSIS — Z808 Family history of malignant neoplasm of other organs or systems: Secondary | ICD-10-CM | POA: Insufficient documentation

## 2014-12-30 DIAGNOSIS — C61 Malignant neoplasm of prostate: Secondary | ICD-10-CM | POA: Diagnosis not present

## 2014-12-30 DIAGNOSIS — C3411 Malignant neoplasm of upper lobe, right bronchus or lung: Secondary | ICD-10-CM | POA: Diagnosis not present

## 2014-12-30 DIAGNOSIS — K589 Irritable bowel syndrome without diarrhea: Secondary | ICD-10-CM | POA: Diagnosis not present

## 2014-12-30 DIAGNOSIS — I499 Cardiac arrhythmia, unspecified: Secondary | ICD-10-CM | POA: Diagnosis not present

## 2014-12-30 DIAGNOSIS — I2510 Atherosclerotic heart disease of native coronary artery without angina pectoris: Secondary | ICD-10-CM

## 2014-12-30 DIAGNOSIS — Z79899 Other long term (current) drug therapy: Secondary | ICD-10-CM | POA: Diagnosis not present

## 2014-12-30 DIAGNOSIS — Z7982 Long term (current) use of aspirin: Secondary | ICD-10-CM | POA: Insufficient documentation

## 2014-12-30 DIAGNOSIS — J449 Chronic obstructive pulmonary disease, unspecified: Secondary | ICD-10-CM

## 2014-12-30 DIAGNOSIS — E119 Type 2 diabetes mellitus without complications: Secondary | ICD-10-CM | POA: Diagnosis not present

## 2014-12-30 DIAGNOSIS — R32 Unspecified urinary incontinence: Secondary | ICD-10-CM | POA: Diagnosis not present

## 2014-12-30 DIAGNOSIS — Z800 Family history of malignant neoplasm of digestive organs: Secondary | ICD-10-CM | POA: Diagnosis not present

## 2014-12-30 NOTE — Progress Notes (Signed)
Patient does not have living will.  Former smoker. 

## 2014-12-30 NOTE — Progress Notes (Signed)
[place] @ [facility] Telephone:(336) [phone]  Fax:(336) [place]: 02/04/1927  MR#: [id number]  OAC#:[id number]  Patient Care Team: Birdie Sons, MD as PCP - General (Family Medicine) Robert Bellow, MD (General Surgery) Dallas Schimke, MD (Internal Medicine) Vilinda Boehringer, MD as Consulting Physician (Internal Medicine)  CHIEF COMPLAINT:  Chief Complaint  Patient presents with  . Follow-up    Oncology History   1.  Prostate cancer.  Original radical prostatectomy.  Later recurrence and radiation treatment.  Later a rising PSA.  Referred for Lupron beginning May 2002. Treatment 5/02 to 5/03. Drug holiday 5/03 to 3/04. Treatment 3/04 to 2/05. Drug holiday 2/05. Restarted 7/05, off again , last dose 01/2010, then on and off again, se earlier notes,  resumed 06/2012.  2.  Previous surgeries include umbilical hernia repair and lysing of adhesions, inguinal hernia repair then breakdown and recurrence.  3.  Hyperlipidemia.  4.  History of cardiac arrhythmia ventricular beats evaluated in the past by Dr. Clayborn Bigness.    5.  Osteoporosis.     Prostate cancer    Oncology Flowsheet 11/22/2014 11/22/2014 11/23/2014 11/23/2014 11/24/2014 11/24/2014 11/25/2014  methylPREDNISolone sodium succinate (SOLU-MEDROL) IV 60 mg 125 mg 125 mg 40 mg 40 mg 40 mg 40 mg    INTERVAL HISTORY:  81 year old gentleman who has previous history of carcinoma prostate which is castration sensitive prostate cancer.  Patient is on Lupron injection off-and-on.  Patient was admitted in the hospital with history of pneumonia a CT scan revealed right upper lobe lung mass. Prior to admission in the hospital patient was taking care of himself.  Now on oxygen at rest oxygen saturation was 84 percent.  With 2 L of oxygen and improved to 97%.  Patient is using inhalers.  Was also seen by pulmonologist She was referred to me for further evaluation and treatment, consideration  REVIEW OF SYSTEMS:   Gen.  status: Patient is on oxygen.  Not in any acute distress. HEENT: No soreness in the mouth.  No cough. No hemoptysis or chest pain Cardiac: No chest pain or paroxysmal nocturnal dyspnea GI: No nausea no vomiting no diarrhea appetite has been stable. Lower extremity no edema. Musculoskeletal system no bony pain Skin: No rash Neurological system no headache no dizziness As per HPI. Otherwise, a complete review of systems is negative.  PAST MEDICAL HISTORY: Past Medical History  Diagnosis Date  . Heart murmur   . COPD (chronic obstructive pulmonary disease)   . High blood cholesterol level   . Irritable bowel syndrome   . Diabetes mellitus without complication   . Osteoporosis   . Incontinence   . COPD (chronic obstructive pulmonary disease) 2000  . Atrial fibrillation   . Cancer 1994    prostate    PAST SURGICAL HISTORY: Past Surgical History  Procedure Laterality Date  . Intraocular lens insertion  2002  . Colonoscopy  03/12/13  . Prostate surgery  1994  . Hernia repair  2011  . Surgery for staph infection  1995  . Colonoscopy  2011  . Bone density test      Spine T-spine = -1.7    FAMILY HISTORY Family History  Problem Relation Age of Onset  . Cancer Father     bladder  . Other Sister     brain tumor  . Cancer Brother     colon  . Asthma Mother   . Osteoporosis Mother     ADVANCED DIRECTIVES:  No  flowsheet data found.  HEALTH MAINTENANCE: History  Substance Use Topics  . Smoking status: Former Smoker -- 1.00 packs/day    Types: Cigarettes    Quit date: 05/15/2006  . Smokeless tobacco: Never Used  . Alcohol Use: No      No Known Allergies  Current Outpatient Prescriptions  Medication Sig Dispense Refill  . albuterol (PROVENTIL HFA;VENTOLIN HFA) 108 (90 BASE) MCG/ACT inhaler Inhale 2 puffs into the lungs every 6 (six) hours as needed for wheezing or shortness of breath.    [name] alendronate (FOSAMAX) 70 MG tablet Take 70 mg by mouth once a week. Take  on Sunday. Take with a full glass of water on an empty stomach.    [name] amLODipine (NORVASC) 5 MG tablet Take 1 tablet (5 mg total) by mouth daily. 30 tablet 1  . aspirin EC 81 MG tablet Take 81 mg by mouth daily.    . Calcium Carbonate-Vitamin D (CALCIUM + D PO) Take 600 mg by mouth 2 (two) times daily.    . cholecalciferol (VITAMIN D) 1000 UNITS tablet Take 2,000 Units by mouth every morning.    . Fluticasone-Salmeterol (ADVAIR DISKUS) 500-50 MCG/DOSE AEPB Inhale 1 puff into the lungs 2 (two) times daily. Rinse and gargle after each use. 60 each 1  . ipratropium-albuterol (DUONEB) 0.5-2.5 (3) MG/3ML SOLN Take 3 mLs by nebulization 4 (four) times daily. 360 mL 1  . Leuprolide Acetate (LUPRON IJ) Inject 1 each as directed every 3 (three) months.     . metFORMIN (GLUCOPHAGE) 500 MG tablet TAKE ONE TABLET BY MOUTH ONCE DAILY 30 tablet 6  . metoprolol (LOPRESSOR) 50 MG tablet Take 1 tablet (50 mg total) by mouth 2 (two) times daily. 60 tablet 1  . Omega-3 Fatty Acids (FISH OIL) 1000 MG CAPS Take 2,000 mg by mouth every morning.    . pravastatin (PRAVACHOL) 40 MG tablet Take 40 mg by mouth daily.    [name] tiotropium (SPIRIVA) 18 MCG inhalation capsule Place 18 mcg into inhaler and inhale daily.     No current facility-administered medications for this visit.    OBJECTIVE:  Filed Vitals:   12/30/14 1048  BP: 151/74  Pulse: 54  Temp: 95.7 F (35.4 C)     Body mass index is 24.78 kg/(m^2).    ECOG FS:1 - Symptomatic but completely ambulatory  PHYSICAL EXAM: Gen. status: Alert and oriented individual not any acute distress Lungs: Emphysematous chest diminished air entry on both sides Cardiac: Days no irregular heart sounds soft systolic murmur. Abdominal exam revealed normal bowel sounds. The abdomen was soft, non-tender, and without masses, organomegaly, or appreciable enlargement of the abdominal aorta. Lymphatic system: Supraclavicular, cervical, axillary, inguinal lymph nodes are not  palpable Examination of the skin revealed no evidence of significant rashes, suspicious appearing nevi or other concerning lesions. Neurologically, the patient was awake, alert, and oriented to person, place and time. There were no obvious focal neurologic abnormalities. Lower extremity no edema    LAB RESULTS:  Lab Results  Component Value Date   PSA 0.1 10/09/2014     STUDIES: Ct Chest W Contrast  12/14/2014   CLINICAL DATA:  Right hilar mass on chest radiograph. Pneumonia and shortness of breath. Hospitalized 2 weeks ago for pneumonia. History of prostate cancer on Lupron.  EXAM: CT CHEST WITH CONTRAST  TECHNIQUE: Multidetector CT imaging of the chest was performed during intravenous contrast administration.  CONTRAST:  63m OMNIPAQUE IOHEXOL 300 MG/ML  SOLN  COMPARISON:  Abdominal CT 12/21/2006 and  chest x-ray 11/24/2014  FINDINGS: The lungs are adequately inflated with moderate bilateral emphysematous disease worse over the superior segment left lower lobe. There is a spiculated mass over the most inferior aspect of the right upper lobe adjacent the minor fissure measuring 3.3 x 4 cm in transverse in AP dimensions. This concerning for a primary bronchogenic malignancy. There is a 4 mm triangular opacity along the minor fissure. There is a 2-3 mm calcified granuloma over the lateral aspect of the right upper lobe. Minimal linear scarring over the lung bases. There is a 1.7 x 2 cm nodular opacity over the lingula with somewhat linear component as this may represent rounded atelectasis, although cannot exclude a metastatic lesion. There is subtle patchy micro nodularity over the left base. No evidence of pleural effusion. Airways are within normal.  Heart is normal in size. There is mild calcified plaque over the left main, left anterior descending and lateral circumflex coronary arteries. There is calcified plaque over the aortic arch. There is a 1 cm right hilar lymph node. There is no  significant mediastinal or axillary adenopathy. There is a stable focal moderate size dilatation of the distal esophagus containing moderate ingested material as this is more typical of an epiphrenic diverticulum and not a hiatal hernia.  Images through the upper abdomen demonstrate a stable lymph node over the gastrohepatic ligament. Findings suggesting gallbladder sludge versus hepatobiliary contrast excretion.  There are degenerative changes of the spine.  IMPRESSION: Spiculated mass over the inferior aspect of the right upper lobe measuring 3.3 x 4 cm concerning for primary bronchogenic malignancy. 2 cm nodular focus over the lingula with associated linear component which may represent rounded atelectasis although cannot exclude a metastatic focus. 1 cm right hilar lymph node. No significant mediastinal or axillary adenopathy.  Triangular nodular density over the right minor fissure likely benign. Evidence of prior granulomas disease. Subtle patchy micronodular density over the left lower lobe which may be due to inflammatory or infectious/atypical infectious process. Recommend attention to these areas on follow-up CT.  Moderate emphysematous disease.  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  Atherosclerotic coronary artery disease.   Electronically Signed   By: Marin Olp M.D.   On: 12/14/2014 17:14    ASSESSMENT: Right upper lobe lung mass.  There is a spiculated lung mass approximately 3.3 cm suggestive of primary bronchogenic carcinoma. Carcinoma prostate on Lupron therapy last PSA 0.1 in April of 2016 Repeat PSA has been planned next week  MEDICAL DECISION MAKING:  All lab data has been reviewed.  CT scan of the lung has been reviewed independently.  Right upper lobe spiculated mass is very consistent with primary lung cancer There is one hilar lymph node Patient has  underlying emphysema.  And oxygen dependent. Prior to last hospitalization patient was independent and doing most of his activity Patient desires further evaluation and treatment Will discuss case in tumor conference Possibility of bronchoscopy biopsy and consideration of radiation therapy Unless PET scan shows positive mediastinal lymph node chemotherapy may not be added I discussed pros and cons of just observation versus pursuing biopsy Total duration of visit was 55 minutes.  50% or more time was spent in counseling patient and family regarding prognosis and options of treatment and available resources  Patient expressed understanding and was in agreement with this plan. He also understands that He can call clinic at any time with any questions,  concerns, or complaints.    No matching staging information was found for the patient.  Forest Gleason, MD   12/30/2014 4:57 PM

## 2015-01-01 ENCOUNTER — Telehealth: Payer: Self-pay | Admitting: *Deleted

## 2015-01-01 ENCOUNTER — Other Ambulatory Visit: Payer: Self-pay | Admitting: Family Medicine

## 2015-01-01 DIAGNOSIS — C61 Malignant neoplasm of prostate: Secondary | ICD-10-CM

## 2015-01-01 NOTE — Telephone Encounter (Signed)
  Oncology Nurse Navigator Documentation    Navigator Encounter Type: Telephone (01/01/15 1000)               Contacted patient and reviewed plan of care to include PET scan and ENB biopsy. Verified no anticoagulant medications currently in use and advised that he will be contacted with future appointments.

## 2015-01-04 ENCOUNTER — Telehealth: Payer: Self-pay | Admitting: *Deleted

## 2015-01-04 NOTE — Telephone Encounter (Signed)
  Oncology Nurse Navigator Documentation    Navigator Encounter Type: Telephone (01/04/15 1500)               Notified patient of pre-op appointment scheduled for tomorrow from 1-5 by phone. Also notified of procedure scheduled for Thursday 01/07/15 [place] arrival. Reminded to be NPO after midnight, continue to avoid anticoagulant medications, and have a driver and support person to be with patient at home after the procedure. Patient verbalizes understanding and reads back appointments.

## 2015-01-05 ENCOUNTER — Inpatient Hospital Stay: Admission: RE | Admit: 2015-01-05 | Payer: Commercial Managed Care - HMO | Source: Ambulatory Visit

## 2015-01-05 ENCOUNTER — Encounter: Payer: Self-pay | Admitting: *Deleted

## 2015-01-05 ENCOUNTER — Ambulatory Visit
Admission: RE | Admit: 2015-01-05 | Discharge: 2015-01-05 | Disposition: A | Discharge status: Home / Self Care | Payer: Commercial Managed Care - HMO | Source: Ambulatory Visit | Attending: Family Medicine | Admitting: Family Medicine

## 2015-01-05 DIAGNOSIS — I4891 Unspecified atrial fibrillation: Secondary | ICD-10-CM | POA: Diagnosis not present

## 2015-01-05 DIAGNOSIS — C61 Malignant neoplasm of prostate: Secondary | ICD-10-CM

## 2015-01-05 DIAGNOSIS — R000 Tachycardia, unspecified: Secondary | ICD-10-CM

## 2015-01-05 DIAGNOSIS — I10 Essential (primary) hypertension: Secondary | ICD-10-CM | POA: Diagnosis not present

## 2015-01-05 NOTE — Patient Instructions (Signed)
  Your procedure is scheduled on: 01-07-15 Report to [place] To find out your arrival time please call [phone] between 1PM - 3PM on 01-06-15 [facility])  Remember: Instructions that are not followed completely may result in serious medical risk, up to and including death, or upon the discretion of your surgeon and anesthesiologist your surgery may need to be rescheduled.    _X___ 1. Do not eat food or drink liquids after midnight. No gum chewing or hard candies.     _X___ 2. No Alcohol for 24 hours before or after surgery.   ____ 3. Bring all medications with you on the day of surgery if instructed.    _X___ 4. Notify your doctor if there is any change in your medical condition     (cold, fever, infections).     Do not wear jewelry, make-up, hairpins, clips or nail polish.  Do not wear lotions, powders, or perfumes. You may wear deodorant.  Do not shave 48 hours prior to surgery. Men may shave face and neck.  Do not bring valuables to the hospital.    [facility] is not responsible for any belongings or valuables.               Contacts, dentures or bridgework may not be worn into surgery.  Leave your suitcase in the car. After surgery it may be brought to your room.  For patients admitted to the hospital, discharge time is determined by your  treatment team.   Patients discharged the day of surgery will not be allowed to drive home.   Please read over the following fact sheets that you were given:     _X___ Take these medicines the morning of surgery with A SIP OF WATER:    1. AMLODIPINE  2. METOPROLOL  3. PRAVASTATIN  4.  5.  6.  ____ Fleet Enema (as directed)   ____ Use CHG Soap as directed  __X__ Use inhalers on the day of surgery-USE NEBULIZER, ADVAIR AND SPIRIVA-BRING YOUR ALBUTEROL INHALER TO HOSPITAL  __X__ Stop metformin 2 days prior to surgery-NOW    ____ Take 1/2 of usual insulin dose the night before surgery and none  on the morning of surgery.   ____ Stop Coumadin/Plavix/aspirin-PT STOPPED ASPIRIN 2 WEEKS AGO  ____ Stop Anti-inflammatories-NO NSAIDS OR ASPIRIN PRODUCTS-TYLENOL OK   _X___ Stop supplements until after surgery-STOP FISH OIL NOW  ____ Bring C-Pap to the hospital.

## 2015-01-06 ENCOUNTER — Telehealth: Payer: Self-pay | Admitting: Internal Medicine

## 2015-01-06 ENCOUNTER — Ambulatory Visit
Admission: RE | Admit: 2015-01-06 | Discharge: 2015-01-06 | Disposition: A | Discharge status: Home / Self Care | Payer: Commercial Managed Care - HMO | Source: Ambulatory Visit | Attending: Family Medicine | Admitting: Family Medicine

## 2015-01-06 ENCOUNTER — Encounter
Admission: RE | Admit: 2015-01-06 | Discharge: 2015-01-06 | Disposition: A | Discharge status: Home / Self Care | Payer: Commercial Managed Care - HMO | Source: Ambulatory Visit | Attending: Internal Medicine | Admitting: Internal Medicine

## 2015-01-06 DIAGNOSIS — C3411 Malignant neoplasm of upper lobe, right bronchus or lung: Secondary | ICD-10-CM | POA: Diagnosis not present

## 2015-01-06 DIAGNOSIS — Z9981 Dependence on supplemental oxygen: Secondary | ICD-10-CM | POA: Diagnosis not present

## 2015-01-06 DIAGNOSIS — Z808 Family history of malignant neoplasm of other organs or systems: Secondary | ICD-10-CM | POA: Diagnosis not present

## 2015-01-06 DIAGNOSIS — I4891 Unspecified atrial fibrillation: Secondary | ICD-10-CM | POA: Diagnosis not present

## 2015-01-06 DIAGNOSIS — Z8052 Family history of malignant neoplasm of bladder: Secondary | ICD-10-CM | POA: Diagnosis not present

## 2015-01-06 DIAGNOSIS — Z79899 Other long term (current) drug therapy: Secondary | ICD-10-CM | POA: Diagnosis not present

## 2015-01-06 DIAGNOSIS — R062 Wheezing: Secondary | ICD-10-CM | POA: Diagnosis not present

## 2015-01-06 DIAGNOSIS — R32 Unspecified urinary incontinence: Secondary | ICD-10-CM | POA: Diagnosis not present

## 2015-01-06 DIAGNOSIS — Z800 Family history of malignant neoplasm of digestive organs: Secondary | ICD-10-CM | POA: Diagnosis not present

## 2015-01-06 DIAGNOSIS — M810 Age-related osteoporosis without current pathological fracture: Secondary | ICD-10-CM | POA: Diagnosis not present

## 2015-01-06 DIAGNOSIS — R918 Other nonspecific abnormal finding of lung field: Secondary | ICD-10-CM | POA: Diagnosis present

## 2015-01-06 DIAGNOSIS — R011 Cardiac murmur, unspecified: Secondary | ICD-10-CM | POA: Diagnosis not present

## 2015-01-06 DIAGNOSIS — Z7982 Long term (current) use of aspirin: Secondary | ICD-10-CM | POA: Diagnosis not present

## 2015-01-06 DIAGNOSIS — Z7951 Long term (current) use of inhaled steroids: Secondary | ICD-10-CM | POA: Diagnosis not present

## 2015-01-06 DIAGNOSIS — Z8546 Personal history of malignant neoplasm of prostate: Secondary | ICD-10-CM | POA: Diagnosis not present

## 2015-01-06 DIAGNOSIS — Z87891 Personal history of nicotine dependence: Secondary | ICD-10-CM | POA: Diagnosis not present

## 2015-01-06 DIAGNOSIS — R0602 Shortness of breath: Secondary | ICD-10-CM | POA: Diagnosis not present

## 2015-01-06 DIAGNOSIS — J449 Chronic obstructive pulmonary disease, unspecified: Secondary | ICD-10-CM | POA: Diagnosis not present

## 2015-01-06 DIAGNOSIS — K589 Irritable bowel syndrome without diarrhea: Secondary | ICD-10-CM | POA: Diagnosis not present

## 2015-01-06 DIAGNOSIS — E119 Type 2 diabetes mellitus without complications: Secondary | ICD-10-CM | POA: Diagnosis not present

## 2015-01-06 DIAGNOSIS — E780 Pure hypercholesterolemia: Secondary | ICD-10-CM | POA: Diagnosis not present

## 2015-01-06 HISTORY — DX: Dependence on supplemental oxygen: Z99.81

## 2015-01-06 LAB — GLUCOSE, CAPILLARY: Glucose-Capillary: 122 mg/dL — ABNORMAL HIGH (ref 65–99)

## 2015-01-06 MED ORDER — FLUDEOXYGLUCOSE F - 18 (FDG) INJECTION
12.6900 | Freq: Once | INTRAVENOUS | Status: AC | PRN
  Administered 2015-01-06: 12.69 via INTRAVENOUS

## 2015-01-06 NOTE — Telephone Encounter (Signed)
LM for pre admission that VM will be placing orders. Nothing further needed.

## 2015-01-06 NOTE — Telephone Encounter (Signed)
preadmit orders will be placed.

## 2015-01-06 NOTE — Telephone Encounter (Signed)
Dr. Stevenson Clinch aware. Will forward to him.

## 2015-01-07 ENCOUNTER — Encounter
Admission: RE | Disposition: A | Discharge status: Home / Self Care | Payer: Self-pay | Source: Ambulatory Visit | Attending: Internal Medicine

## 2015-01-07 ENCOUNTER — Other Ambulatory Visit: Payer: Self-pay

## 2015-01-07 ENCOUNTER — Encounter: Payer: Self-pay | Admitting: *Deleted

## 2015-01-07 ENCOUNTER — Ambulatory Visit: Payer: Self-pay

## 2015-01-07 ENCOUNTER — Ambulatory Visit: Payer: Commercial Managed Care - HMO | Admitting: Anesthesiology

## 2015-01-07 ENCOUNTER — Ambulatory Visit: Payer: Self-pay | Admitting: Family Medicine

## 2015-01-07 ENCOUNTER — Ambulatory Visit: Payer: Self-pay | Admitting: Oncology

## 2015-01-07 ENCOUNTER — Ambulatory Visit: Payer: Commercial Managed Care - HMO

## 2015-01-07 ENCOUNTER — Ambulatory Visit
Admission: RE | Admit: 2015-01-07 | Discharge: 2015-01-07 | Disposition: A | Discharge status: Home / Self Care | Payer: Commercial Managed Care - HMO | Source: Ambulatory Visit | Attending: Internal Medicine | Admitting: Internal Medicine

## 2015-01-07 DIAGNOSIS — M810 Age-related osteoporosis without current pathological fracture: Secondary | ICD-10-CM | POA: Insufficient documentation

## 2015-01-07 DIAGNOSIS — J449 Chronic obstructive pulmonary disease, unspecified: Secondary | ICD-10-CM | POA: Insufficient documentation

## 2015-01-07 DIAGNOSIS — Z9981 Dependence on supplemental oxygen: Secondary | ICD-10-CM | POA: Insufficient documentation

## 2015-01-07 DIAGNOSIS — R0602 Shortness of breath: Secondary | ICD-10-CM | POA: Insufficient documentation

## 2015-01-07 DIAGNOSIS — Z8052 Family history of malignant neoplasm of bladder: Secondary | ICD-10-CM | POA: Insufficient documentation

## 2015-01-07 DIAGNOSIS — Z7951 Long term (current) use of inhaled steroids: Secondary | ICD-10-CM | POA: Insufficient documentation

## 2015-01-07 DIAGNOSIS — C3411 Malignant neoplasm of upper lobe, right bronchus or lung: Secondary | ICD-10-CM | POA: Diagnosis not present

## 2015-01-07 DIAGNOSIS — R062 Wheezing: Secondary | ICD-10-CM | POA: Insufficient documentation

## 2015-01-07 DIAGNOSIS — Z79899 Other long term (current) drug therapy: Secondary | ICD-10-CM | POA: Insufficient documentation

## 2015-01-07 DIAGNOSIS — R011 Cardiac murmur, unspecified: Secondary | ICD-10-CM | POA: Insufficient documentation

## 2015-01-07 DIAGNOSIS — Z8546 Personal history of malignant neoplasm of prostate: Secondary | ICD-10-CM | POA: Insufficient documentation

## 2015-01-07 DIAGNOSIS — Z808 Family history of malignant neoplasm of other organs or systems: Secondary | ICD-10-CM | POA: Insufficient documentation

## 2015-01-07 DIAGNOSIS — R918 Other nonspecific abnormal finding of lung field: Secondary | ICD-10-CM | POA: Diagnosis not present

## 2015-01-07 DIAGNOSIS — R32 Unspecified urinary incontinence: Secondary | ICD-10-CM | POA: Insufficient documentation

## 2015-01-07 DIAGNOSIS — I4891 Unspecified atrial fibrillation: Secondary | ICD-10-CM | POA: Insufficient documentation

## 2015-01-07 DIAGNOSIS — Z87891 Personal history of nicotine dependence: Secondary | ICD-10-CM | POA: Insufficient documentation

## 2015-01-07 DIAGNOSIS — Z800 Family history of malignant neoplasm of digestive organs: Secondary | ICD-10-CM | POA: Insufficient documentation

## 2015-01-07 DIAGNOSIS — K589 Irritable bowel syndrome without diarrhea: Secondary | ICD-10-CM | POA: Insufficient documentation

## 2015-01-07 DIAGNOSIS — E119 Type 2 diabetes mellitus without complications: Secondary | ICD-10-CM | POA: Insufficient documentation

## 2015-01-07 DIAGNOSIS — E780 Pure hypercholesterolemia: Secondary | ICD-10-CM | POA: Insufficient documentation

## 2015-01-07 DIAGNOSIS — Z7982 Long term (current) use of aspirin: Secondary | ICD-10-CM | POA: Insufficient documentation

## 2015-01-07 HISTORY — DX: Pneumonia, unspecified organism: J18.9

## 2015-01-07 HISTORY — PX: ELECTROMAGNETIC NAVIGATION BROCHOSCOPY: SHX5369

## 2015-01-07 HISTORY — DX: Gastro-esophageal reflux disease without esophagitis: K21.9

## 2015-01-07 HISTORY — DX: Essential (primary) hypertension: I10

## 2015-01-07 HISTORY — DX: Chronic kidney disease, unspecified: N18.9

## 2015-01-07 LAB — GLUCOSE, CAPILLARY: Glucose-Capillary: 94 mg/dL (ref 65–99)

## 2015-01-07 SURGERY — ELECTROMAGNETIC NAVIGATION BRONCHOSCOPY
Anesthesia: General

## 2015-01-07 MED ORDER — PROPOFOL 10 MG/ML IV BOLUS
INTRAVENOUS | Status: DC | PRN
  Administered 2015-01-07: 30 mg via INTRAVENOUS
  Administered 2015-01-07: 80 mg via INTRAVENOUS

## 2015-01-07 MED ORDER — EPHEDRINE SULFATE 50 MG/ML IJ SOLN
INTRAMUSCULAR | Status: DC | PRN
  Administered 2015-01-07 (×2): 10 mg via INTRAVENOUS

## 2015-01-07 MED ORDER — PHENYLEPHRINE HCL 10 MG/ML IJ SOLN
INTRAMUSCULAR | Status: DC | PRN
  Administered 2015-01-07 (×2): 100 ug via INTRAVENOUS

## 2015-01-07 MED ORDER — ONDANSETRON HCL 4 MG/2ML IJ SOLN
INTRAMUSCULAR | Status: AC
  Administered 2015-01-07: 4 mg via INTRAVENOUS
  Filled 2015-01-07: qty 2

## 2015-01-07 MED ORDER — ONDANSETRON HCL 4 MG/2ML IJ SOLN: INTRAMUSCULAR | Status: DC | PRN

## 2015-01-07 MED ORDER — FAMOTIDINE 20 MG PO TABS
ORAL_TABLET | ORAL | Status: AC
  Administered 2015-01-07: 20 mg via ORAL
  Filled 2015-01-07: qty 1

## 2015-01-07 MED ORDER — IPRATROPIUM-ALBUTEROL 0.5-2.5 (3) MG/3ML IN SOLN
3.0000 mL | Freq: Once | RESPIRATORY_TRACT | Status: AC
  Administered 2015-01-07: 3 mL via RESPIRATORY_TRACT

## 2015-01-07 MED ORDER — NEOSTIGMINE METHYLSULFATE 10 MG/10ML IV SOLN
INTRAVENOUS | Status: DC | PRN
  Administered 2015-01-07: 5 mg via INTRAVENOUS

## 2015-01-07 MED ORDER — FENTANYL CITRATE (PF) 100 MCG/2ML IJ SOLN
INTRAMUSCULAR | Status: DC | PRN
  Administered 2015-01-07 (×2): 50 ug via INTRAVENOUS

## 2015-01-07 MED ORDER — GLYCOPYRROLATE 0.2 MG/ML IJ SOLN
INTRAMUSCULAR | Status: DC | PRN
  Administered 2015-01-07: .8 mg via INTRAVENOUS
  Administered 2015-01-07: 0.2 mg via INTRAVENOUS

## 2015-01-07 MED ORDER — LIDOCAINE HCL (CARDIAC) 20 MG/ML IV SOLN
INTRAVENOUS | Status: DC | PRN
  Administered 2015-01-07: 30 mg via INTRAVENOUS

## 2015-01-07 MED ORDER — DEXAMETHASONE SODIUM PHOSPHATE 4 MG/ML IJ SOLN
INTRAMUSCULAR | Status: DC | PRN
  Administered 2015-01-07: 10 mg via INTRAVENOUS

## 2015-01-07 MED ORDER — MIDAZOLAM HCL 2 MG/2ML IJ SOLN
INTRAMUSCULAR | Status: DC | PRN
  Administered 2015-01-07: 0.5 mg via INTRAVENOUS

## 2015-01-07 MED ORDER — SODIUM CHLORIDE 0.9 % IV SOLN
INTRAVENOUS | Status: DC
  Administered 2015-01-07 (×2): via INTRAVENOUS

## 2015-01-07 MED ORDER — ONDANSETRON HCL 4 MG/2ML IJ SOLN
4.0000 mg | Freq: Once | INTRAMUSCULAR | Status: AC | PRN
  Administered 2015-01-07: 4 mg via INTRAVENOUS

## 2015-01-07 MED ORDER — FAMOTIDINE 20 MG PO TABS
20.0000 mg | ORAL_TABLET | Freq: Once | ORAL | Status: AC
  Administered 2015-01-07: 20 mg via ORAL

## 2015-01-07 MED ORDER — FENTANYL CITRATE (PF) 100 MCG/2ML IJ SOLN
INTRAMUSCULAR | Status: AC
  Administered 2015-01-07: 25 ug via INTRAVENOUS
  Filled 2015-01-07: qty 2

## 2015-01-07 MED ORDER — IPRATROPIUM-ALBUTEROL 0.5-2.5 (3) MG/3ML IN SOLN
RESPIRATORY_TRACT | Status: AC
  Administered 2015-01-07: 3 mL via RESPIRATORY_TRACT
  Filled 2015-01-07: qty 3

## 2015-01-07 MED ORDER — ROCURONIUM BROMIDE 100 MG/10ML IV SOLN
INTRAVENOUS | Status: DC | PRN
  Administered 2015-01-07: 30 mg via INTRAVENOUS
  Administered 2015-01-07 (×2): 10 mg via INTRAVENOUS

## 2015-01-07 MED ORDER — FENTANYL CITRATE (PF) 100 MCG/2ML IJ SOLN
25.0000 ug | INTRAMUSCULAR | Status: DC | PRN
  Administered 2015-01-07 (×4): 25 ug via INTRAVENOUS

## 2015-01-07 NOTE — Op Note (Signed)
[facility] Patient Name: Alan Weaver Procedure Date: 01/07/2015 10:56 AM MRN: [id number] Account #: [id number] Date of Birth: 1926/06/27 Admit Type: Outpatient Age: 81 Room: OR Note Status: Finalized Attending MD: Vilinda Boehringer,  Procedure:         Navigation Indications:       Right upper lobe mass Providers:         Tamee Battin, Sullivan Lone, Technician (Technician),                     Annia Belt, [place]. Environmental education officer) Referring MD:       Medicines:         General Anesthesia, See the Anesthesia note for                     documentation of the administered medications Complications:     No immediate complications. Estimated blood loss: Minimal Procedure:         Pre-Anesthesia Assessment:                    - A History and Physical has been performed. Patient meds                     and allergies have been reviewed. The risks and benefits                     of the procedure and the sedation options and risks were                     discussed with the patient. All questions were answered                     and informed consent was obtained. Patient identification                     and proposed procedure were verified prior to the                     procedure by the physician in the procedure room. Mental                     Status Examination: alert and oriented. Airway                     Examination: normal oropharyngeal airway. Respiratory                     Examination: clear to auscultation. CV Examination:                     normal. After reviewing the risks and benefits, the                     patient was deemed in satisfactory condition to undergo                     the procedure. The anesthesia plan was to use deep                     sedation / analgesia. Immediately prior to administration                     of medications, the patient was re-assessed for adequacy  to receive sedatives. The heart rate, respiratory  rate,                     oxygen saturations, blood pressure, adequacy of pulmonary                     ventilation, and response to care were monitored                     throughout the procedure. The physical status of the                     patient was re-assessed after the procedure.                    After obtaining informed consent, the bronchoscope was                     passed under direct vision. Throughout the procedure, the                     patient's blood pressure, pulse, and oxygen saturations                     were monitored continuously. the Bronchoscope Olympus                     BF-Q180 S# [id number] was introduced through the nose, via                     the endotracheal tube (the patient was intubated for the                     procedure) and advanced to the carina. The procedure was                     accomplished with ease. The patient tolerated the                     procedure well. The total duration of the procedure was 0                     hours and 11 minutes. Findings:      The endotracheal tube is in good position. The visualized portion of the       trachea is of normal caliber. The carina is sharp. The tracheobronchial       tree was examined to at least the first subsegmental level. Bronchial       mucosa and anatomy are normal; there are no endobronchial lesions, and       no secretions.      Transbronchial biopsies were performed in the RUL anterior segment (B3),       in the RUL apical segment (B1) and in the RUL posterior segment (B2) of       the lung using forceps and using needle forceps and sent for       histopathology examination. The procedure was guided by fluoroscopy.       Twelve biopsy passes were performed. Twelve biopsy samples were obtained.      Transbronchial biopsies were performed in the RUL posterior segment (B2)       of the lung using forceps and sent for routine cytology. The procedure       was guided by fluoroscopy.  Three biopsy passes were performed.  Three       biopsy samples were obtained. Impression:        - Transbronchial lung biopsies were performed.                    - Right upper lobe mass Recommendation:    - Await cytology results.                    - Await biopsy, brushing and cytology results. Aralyn Nowak,  01/07/2015 4:18:59 PM Number of Addenda: 0 Note Initiated On: 01/07/2015 10:56 AM      [facility]

## 2015-01-07 NOTE — Anesthesia Procedure Notes (Addendum)
Procedure Name: Intubation Date/Time: 01/07/2015 12:16 PM Performed by: Allean Found Pre-anesthesia Checklist: Patient identified, Emergency Drugs available, Suction available, Patient being monitored and Timeout performed Patient Re-evaluated:Patient Re-evaluated prior to inductionOxygen Delivery Method: Circle system utilized Preoxygenation: Pre-oxygenation with 100% oxygen Intubation Type: IV induction and Cricoid Pressure applied Ventilation: Mask ventilation without difficulty Laryngoscope Size: Mac and 4 Grade View: Grade II Tube type: Oral Tube size: 8.5 mm Number of attempts: 2 Airway Equipment and Method: Stylet Placement Confirmation: ETT inserted through vocal cords under direct vision Secured at: 21 cm Tube secured with: Tape Dental Injury: Teeth and Oropharynx as per pre-operative assessment

## 2015-01-07 NOTE — Progress Notes (Signed)
Dr Myra Gianotti to bedside, orders for EKG and Duoneb ordered. Duoneb in progress. Cardiopulm here for EKG.

## 2015-01-07 NOTE — Discharge Instructions (Signed)
AMBULATORY SURGERY  DISCHARGE INSTRUCTIONS   1) The drugs that you were given will stay in your system until tomorrow so for the next 24 hours you should not:  A) Drive an automobile B) Make any legal decisions C) Drink any alcoholic beverage   2) You may resume regular meals tomorrow.  Today it is better to start with liquids and gradually work up to solid foods.  You may eat anything you prefer, but it is better to start with liquids, then soup and crackers, and gradually work up to solid foods.   3) Please notify your doctor immediately if you have any unusual bleeding, trouble breathing, redness and pain at the surgery site, drainage, fever, or pain not relieved by medication.    4) Additional Instructions:    Flexible Bronchoscopy, Care After Refer to this sheet in the next few weeks. These instructions provide you with information on caring for yourself after your procedure. Your health care provider may also give you more specific instructions. Your treatment has been planned according to current medical practices, but problems sometimes occur. Call your health care provider if you have any problems or questions after your procedure.  WHAT TO EXPECT AFTER THE PROCEDURE It is normal to have the following symptoms for 24-48 hours after the procedure:   Increased cough.  Low-grade fever.  Sore throat or hoarse voice.  Small streaks of blood in your thick spit (sputum) if tissue samples were taken (biopsy). HOME CARE INSTRUCTIONS   Do not eat or drink anything for 2 hours after your procedure. Your nose and throat were numbed by medicine. If you try to eat or drink before the medicine wears off, food or drink could go into your lungs or you could burn yourself. After the numbness is gone and your cough and gag reflexes have returned, you may eat soft food and drink liquids slowly.   The day after the procedure, you can go back to your normal diet.   You may resume  normal activities.   Keep all follow-up visits as directed by your health care provider. It is important to keep all your appointments, especially if tissue samples were taken for testing (biopsy). SEEK IMMEDIATE MEDICAL CARE IF:   You have increasing shortness of breath.   You become light-headed or faint.   You have chest pain.   You have any new concerning symptoms.  You cough up more than a small amount of blood.  The amount of blood you cough up increases. MAKE SURE YOU:  Understand these instructions.  Will watch your condition.  Will get help right away if you are not doing well or get worse. Document Released: 12/30/2004 Document Revised: 10/27/2013 Document Reviewed: 02/14/2013 [facility] Patient Information 2015 [place], [place]. This information is not intended to replace advice given to you by your health care provider. Make sure you discuss any questions you have with your health care provider.     Please contact your physician with any problems or Same Day Surgery at [phone], Monday through Friday 6 am to 4 pm, or [place] [facility] number at [phone].

## 2015-01-07 NOTE — H&P (Signed)
See H&P by Dr. Stevenson Clinch on 12/24/14. No changes noted RUL lesion   Vilinda Boehringer, MD [facility] Pager [phone] (Please enter 7-digits)

## 2015-01-07 NOTE — Progress Notes (Signed)
Dr Myra Gianotti at bedside. Pt states throat and chest heaviness easing. Dr Myra Gianotti OK for discharge.

## 2015-01-07 NOTE — Progress Notes (Signed)
Duoneb complete with sx remaining the same. Sore throat and feels like something sitting on chest.

## 2015-01-07 NOTE — Transfer of Care (Signed)
Immediate Anesthesia Transfer of Care Note  Patient: Alan Weaver  Procedure(s) Performed: Procedure(s): ELECTROMAGNETIC NAVIGATION BRONCHOSCOPY (N/A)  Patient Location: PACU  Anesthesia Type:General  Level of Consciousness: awake  Airway & Oxygen Therapy: Patient Spontanous Breathing and Patient connected to face mask oxygen  Post-op Assessment: Report given to RN and Post -op Vital signs reviewed and stable  Post vital signs: Reviewed and stable  Last Vitals:  Filed Vitals:   01/07/15 1005  BP: 139/83  Pulse: 51  Temp: 35.7 C  Resp: 16    Complications: No apparent anesthesia complications

## 2015-01-07 NOTE — Anesthesia Postprocedure Evaluation (Signed)
  Anesthesia Post-op Note  Patient: Alan Weaver  Procedure(s) Performed: Procedure(s): ELECTROMAGNETIC NAVIGATION BRONCHOSCOPY (N/A)  Anesthesia type:General  Patient location: PACU  Post pain: Pain level controlled  Post assessment: Post-op Vital signs reviewed, Patient's Cardiovascular Status Stable, Respiratory Function Stable, Patent Airway and No signs of Nausea or vomiting  Post vital signs: Reviewed and stable  Last Vitals:  Filed Vitals:   01/07/15 1355  BP: 152/87  Pulse: 70  Temp:   Resp: 17    Level of consciousness: awake, alert  and patient cooperative  Complications: No apparent anesthesia complications

## 2015-01-07 NOTE — Progress Notes (Signed)
Pt c/o feeling like something sitting on his chest. Dr Myra Gianotti aware.

## 2015-01-07 NOTE — Anesthesia Preprocedure Evaluation (Addendum)
Anesthesia Evaluation  Patient identified by MRN, date of birth, ID band Patient awake    Reviewed: Allergy & Precautions, NPO status , Patient's Chart, lab work & pertinent test results, reviewed documented beta blocker date and time   History of Anesthesia Complications Negative for: history of anesthetic complications  Airway Mallampati: III  TM Distance: >3 FB Neck ROM: Full    Dental  (+) Edentulous Upper   Pulmonary COPD COPD inhaler and oxygen dependent, former smoker,  breath sounds clear to auscultation  Pulmonary exam normal       Cardiovascular Exercise Tolerance: Poor hypertension, Pt. on medications and Pt. on home beta blockers Normal cardiovascular exam+ dysrhythmias Atrial Fibrillation Rhythm:Regular Rate:Normal     Neuro/Psych negative neurological ROS  negative psych ROS   GI/Hepatic negative GI ROS, Neg liver ROS, GERD-  Medicated and Poorly Controlled,  Endo/Other  negative endocrine ROSdiabetes, Well Controlled, Type 2, Oral Hypoglycemic Agents  Renal/GU negative Renal ROS  negative genitourinary   Musculoskeletal negative musculoskeletal ROS (+)   Abdominal   Peds negative pediatric ROS (+)  Hematology negative hematology ROS (+)   Anesthesia Other Findings   Reproductive/Obstetrics negative OB ROS                            Anesthesia Physical Anesthesia Plan  ASA: III  Anesthesia Plan: General   Post-op Pain Management:    Induction: Intravenous  Airway Management Planned: Oral ETT  Additional Equipment:   Intra-op Plan:   Post-operative Plan: Extubation in OR  Informed Consent: I have reviewed the patients History and Physical, chart, labs and discussed the procedure including the risks, benefits and alternatives for the proposed anesthesia with the patient or authorized representative who has indicated his/her understanding and acceptance.      Plan Discussed with: CRNA and Surgeon  Anesthesia Plan Comments:        Anesthesia Quick Evaluation

## 2015-01-11 ENCOUNTER — Other Ambulatory Visit: Payer: Self-pay | Admitting: Family Medicine

## 2015-01-11 ENCOUNTER — Inpatient Hospital Stay: Payer: Commercial Managed Care - HMO

## 2015-01-11 ENCOUNTER — Inpatient Hospital Stay (HOSPITAL_BASED_OUTPATIENT_CLINIC_OR_DEPARTMENT_OTHER): Payer: Commercial Managed Care - HMO | Admitting: Oncology

## 2015-01-11 VITALS — BP 127/78 | HR 60 | Temp 96.1°F | Wt 172.4 lb

## 2015-01-11 DIAGNOSIS — R918 Other nonspecific abnormal finding of lung field: Secondary | ICD-10-CM | POA: Diagnosis not present

## 2015-01-11 DIAGNOSIS — Z800 Family history of malignant neoplasm of digestive organs: Secondary | ICD-10-CM

## 2015-01-11 DIAGNOSIS — I499 Cardiac arrhythmia, unspecified: Secondary | ICD-10-CM

## 2015-01-11 DIAGNOSIS — Z87891 Personal history of nicotine dependence: Secondary | ICD-10-CM

## 2015-01-11 DIAGNOSIS — Z808 Family history of malignant neoplasm of other organs or systems: Secondary | ICD-10-CM

## 2015-01-11 DIAGNOSIS — M818 Other osteoporosis without current pathological fracture: Secondary | ICD-10-CM

## 2015-01-11 DIAGNOSIS — E785 Hyperlipidemia, unspecified: Secondary | ICD-10-CM

## 2015-01-11 DIAGNOSIS — Z8052 Family history of malignant neoplasm of bladder: Secondary | ICD-10-CM

## 2015-01-11 DIAGNOSIS — Z79818 Long term (current) use of other agents affecting estrogen receptors and estrogen levels: Secondary | ICD-10-CM | POA: Diagnosis not present

## 2015-01-11 DIAGNOSIS — C61 Malignant neoplasm of prostate: Secondary | ICD-10-CM

## 2015-01-11 DIAGNOSIS — Z79899 Other long term (current) drug therapy: Secondary | ICD-10-CM

## 2015-01-11 DIAGNOSIS — K589 Irritable bowel syndrome without diarrhea: Secondary | ICD-10-CM

## 2015-01-11 DIAGNOSIS — E119 Type 2 diabetes mellitus without complications: Secondary | ICD-10-CM

## 2015-01-11 DIAGNOSIS — R32 Unspecified urinary incontinence: Secondary | ICD-10-CM

## 2015-01-11 DIAGNOSIS — I4891 Unspecified atrial fibrillation: Secondary | ICD-10-CM

## 2015-01-11 DIAGNOSIS — Z7982 Long term (current) use of aspirin: Secondary | ICD-10-CM

## 2015-01-11 DIAGNOSIS — I2510 Atherosclerotic heart disease of native coronary artery without angina pectoris: Secondary | ICD-10-CM

## 2015-01-11 DIAGNOSIS — J449 Chronic obstructive pulmonary disease, unspecified: Secondary | ICD-10-CM

## 2015-01-11 LAB — CBC WITH DIFFERENTIAL/PLATELET
Basophils Absolute: 0 10*3/uL (ref 0–0.1)
Basophils Relative: 1 %
EOS PCT: 2 %
Eosinophils Absolute: 0.1 10*3/uL (ref 0–0.7)
HCT: 43.9 % (ref 40.0–52.0)
HEMOGLOBIN: 14.2 g/dL (ref 13.0–18.0)
LYMPHS ABS: 1.1 10*3/uL (ref 1.0–3.6)
Lymphocytes Relative: 16 %
MCH: 28.4 pg (ref 26.0–34.0)
MCHC: 32.3 g/dL (ref 32.0–36.0)
MCV: 88 fL (ref 80.0–100.0)
Monocytes Absolute: 0.6 10*3/uL (ref 0.2–1.0)
Monocytes Relative: 9 %
NEUTROS PCT: 74 %
Neutro Abs: 5.3 10*3/uL (ref 1.4–6.5)
Platelets: 285 10*3/uL (ref 150–440)
RBC: 4.99 MIL/uL (ref 4.40–5.90)
RDW: 15.1 % — ABNORMAL HIGH (ref 11.5–14.5)
WBC: 7.2 10*3/uL (ref 3.8–10.6)

## 2015-01-11 LAB — COMPREHENSIVE METABOLIC PANEL
ALBUMIN: 3.8 g/dL (ref 3.5–5.0)
ALT: 13 U/L — ABNORMAL LOW (ref 17–63)
AST: 16 U/L (ref 15–41)
Alkaline Phosphatase: 43 U/L (ref 38–126)
Anion gap: 5 (ref 5–15)
BILIRUBIN TOTAL: 0.7 mg/dL (ref 0.3–1.2)
BUN: 19 mg/dL (ref 6–20)
CALCIUM: 8.7 mg/dL — AB (ref 8.9–10.3)
CHLORIDE: 99 mmol/L — AB (ref 101–111)
CO2: 32 mmol/L (ref 22–32)
CREATININE: 1.05 mg/dL (ref 0.61–1.24)
GFR calc non Af Amer: >60 mL/min (ref >60)
Glucose, Bld: 119 mg/dL — ABNORMAL HIGH (ref 65–99)
POTASSIUM: 4.1 mmol/L (ref 3.5–5.1)
Sodium: 136 mmol/L (ref 135–145)
Total Protein: 7 g/dL (ref 6.5–8.1)

## 2015-01-11 LAB — PSA: PSA: 0.15 ng/mL (ref 0.00–4.00)

## 2015-01-11 MED ORDER — LEUPROLIDE ACETATE (4 MONTH) 30 MG IM KIT
30.0000 mg | PACK | Freq: Once | INTRAMUSCULAR | Status: AC
  Administered 2015-01-11: 30 mg via INTRAMUSCULAR
  Filled 2015-01-11: qty 30

## 2015-01-11 NOTE — Progress Notes (Signed)
Patient does not have living will.  Former smoker. 

## 2015-01-13 ENCOUNTER — Inpatient Hospital Stay (HOSPITAL_BASED_OUTPATIENT_CLINIC_OR_DEPARTMENT_OTHER): Payer: Commercial Managed Care - HMO | Admitting: Oncology

## 2015-01-13 ENCOUNTER — Encounter: Payer: Self-pay | Admitting: Radiation Oncology

## 2015-01-13 ENCOUNTER — Ambulatory Visit
Admission: RE | Admit: 2015-01-13 | Discharge: 2015-01-13 | Disposition: A | Discharge status: Home / Self Care | Payer: Commercial Managed Care - HMO | Source: Ambulatory Visit | Attending: Radiation Oncology | Admitting: Radiation Oncology

## 2015-01-13 VITALS — BP 113/68 | HR 68 | Temp 95.4°F | Resp 18 | Wt 173.4 lb

## 2015-01-13 DIAGNOSIS — Z85118 Personal history of other malignant neoplasm of bronchus and lung: Secondary | ICD-10-CM

## 2015-01-13 DIAGNOSIS — Z79818 Long term (current) use of other agents affecting estrogen receptors and estrogen levels: Secondary | ICD-10-CM | POA: Diagnosis not present

## 2015-01-13 DIAGNOSIS — C61 Malignant neoplasm of prostate: Secondary | ICD-10-CM | POA: Diagnosis not present

## 2015-01-13 DIAGNOSIS — I499 Cardiac arrhythmia, unspecified: Secondary | ICD-10-CM

## 2015-01-13 DIAGNOSIS — Z79899 Other long term (current) drug therapy: Secondary | ICD-10-CM

## 2015-01-13 DIAGNOSIS — R918 Other nonspecific abnormal finding of lung field: Secondary | ICD-10-CM

## 2015-01-13 DIAGNOSIS — Z8052 Family history of malignant neoplasm of bladder: Secondary | ICD-10-CM

## 2015-01-13 DIAGNOSIS — I4891 Unspecified atrial fibrillation: Secondary | ICD-10-CM

## 2015-01-13 DIAGNOSIS — Z87891 Personal history of nicotine dependence: Secondary | ICD-10-CM

## 2015-01-13 DIAGNOSIS — E785 Hyperlipidemia, unspecified: Secondary | ICD-10-CM | POA: Diagnosis not present

## 2015-01-13 DIAGNOSIS — J449 Chronic obstructive pulmonary disease, unspecified: Secondary | ICD-10-CM

## 2015-01-13 DIAGNOSIS — M818 Other osteoporosis without current pathological fracture: Secondary | ICD-10-CM

## 2015-01-13 DIAGNOSIS — R32 Unspecified urinary incontinence: Secondary | ICD-10-CM

## 2015-01-13 DIAGNOSIS — K589 Irritable bowel syndrome without diarrhea: Secondary | ICD-10-CM

## 2015-01-13 DIAGNOSIS — Z510 Encounter for antineoplastic radiation therapy: Secondary | ICD-10-CM | POA: Insufficient documentation

## 2015-01-13 DIAGNOSIS — C3411 Malignant neoplasm of upper lobe, right bronchus or lung: Secondary | ICD-10-CM

## 2015-01-13 DIAGNOSIS — Z808 Family history of malignant neoplasm of other organs or systems: Secondary | ICD-10-CM

## 2015-01-13 DIAGNOSIS — E119 Type 2 diabetes mellitus without complications: Secondary | ICD-10-CM

## 2015-01-13 DIAGNOSIS — Z7982 Long term (current) use of aspirin: Secondary | ICD-10-CM

## 2015-01-13 DIAGNOSIS — I2510 Atherosclerotic heart disease of native coronary artery without angina pectoris: Secondary | ICD-10-CM

## 2015-01-13 DIAGNOSIS — Z800 Family history of malignant neoplasm of digestive organs: Secondary | ICD-10-CM

## 2015-01-13 NOTE — Progress Notes (Signed)
Patient does not have living will.  Former smoker. 

## 2015-01-13 NOTE — Consult Note (Signed)
Except an outstanding is perfect of Radiation Oncology NEW PATIENT EVALUATION  Name: Alan Weaver  MRN: [id number]  Date:   01/13/2015     DOB: 03/16/1927   This 81 y.o. male patient presents to the clinic for initial evaluation of probable non-small cell lung cancer of the right upper lobe pathology pending.  REFERRING PHYSICIAN: Birdie Sons, MD  CHIEF COMPLAINT:  Chief Complaint  Patient presents with  . Lung Cancer    consult from Dr. Oliva Bustard    DIAGNOSIS: The encounter diagnosis was Personal history of malignant neoplasm of bronchus and lung.   PREVIOUS INVESTIGATIONS:  CT scan and PET CT scans reviewed Clinical notes reviewed Cytology reports pending  HPI: patient is a 81 year old male well known to our department having been treated for salvage radiation therapy for prostate cancer after initial radical prostatectomy and biochemical recurrence. He has been on pulse intermittent Lupron suppression since 2002. He recently was hospitalized with pneumonia found to have a right upper lobe lung mass. Patient has extremely poor lung function is on occasional nasal oxygen.patient was referred for navigational bronchoscopywith multiple biopsies obtained with pathology pending. He is now referred to radiation oncology for consideration of treatment. He is doing fairly well he specifically denies cough hemoptysis or chest tightness.PET CT demonstrated hypermetabolic inferior right upper lobe mass approximately just under 4 cm. He also had a small hypermetabolic node in the lateral right level II jugular chain suspicious for malignancy although certainly would be unusual for lung primary.  PLANNED TREATMENT REGIMEN: possible SB RT  PAST MEDICAL HISTORY:  has a past medical history of Heart murmur; COPD (chronic obstructive pulmonary disease); High blood cholesterol level; Irritable bowel syndrome; Diabetes mellitus without complication; Osteoporosis; Incontinence; COPD (chronic  obstructive pulmonary disease) (2000); Atrial fibrillation; Cancer (1994); Pneumonia; Hypertension; Chronic kidney disease; GERD (gastroesophageal reflux disease); and On home oxygen therapy.    PAST SURGICAL HISTORY:  Past Surgical History  Procedure Laterality Date  . Colonoscopy  03/12/13  . Prostate surgery  1994  . Hernia repair  2011  . Surgery for staph infection  1995  . Colonoscopy  2011  . Bone density test      Spine T-spine = -1.7    FAMILY HISTORY: family history includes Asthma in his mother; Cancer in his brother and father; Osteoporosis in his mother; Other in his sister.  SOCIAL HISTORY:  reports that he quit smoking about 8 years ago. His smoking use included Cigarettes. He smoked 1.00 pack per day. He has never used smokeless tobacco. He reports that he does not drink alcohol or use illicit drugs.  ALLERGIES: Review of patient's allergies indicates no known allergies.  MEDICATIONS:  Current Outpatient Prescriptions  Medication Sig Dispense Refill  . albuterol (PROVENTIL HFA;VENTOLIN HFA) 108 (90 BASE) MCG/ACT inhaler Inhale 2 puffs into the lungs every 6 (six) hours as needed for wheezing or shortness of breath.     [name] alendronate (FOSAMAX) 70 MG tablet Take 70 mg by mouth once a week. Take on Sunday. Take with a full glass of water on an empty stomach.    [name] amLODipine (NORVASC) 5 MG tablet Take 1 tablet (5 mg total) by mouth daily. (Patient taking differently: Take 5 mg by mouth every morning. ) 30 tablet 1  . aspirin EC 81 MG tablet Take 81 mg by mouth daily.    . Calcium Carbonate-Vitamin D (CALCIUM + D PO) Take 600 mg by mouth 2 (two) times daily.    . cholecalciferol (  VITAMIN D) 1000 UNITS tablet Take 2,000 Units by mouth every morning.    . Fluticasone-Salmeterol (ADVAIR DISKUS) 500-50 MCG/DOSE AEPB Inhale 1 puff into the lungs 2 (two) times daily. Rinse and gargle after each use. 60 each 1  . ipratropium-albuterol (DUONEB) 0.5-2.5 (3) MG/3ML SOLN Take 3 mLs by  nebulization 4 (four) times daily. 360 mL 1  . Leuprolide Acetate (LUPRON IJ) Inject 1 each as directed every 3 (three) months.     . metFORMIN (GLUCOPHAGE) 500 MG tablet TAKE ONE TABLET BY MOUTH ONCE DAILY 30 tablet 6  . metoprolol (LOPRESSOR) 50 MG tablet Take 1 tablet (50 mg total) by mouth 2 (two) times daily. 60 tablet 1  . Omega-3 Fatty Acids (FISH OIL) 1000 MG CAPS Take 2,000 mg by mouth every morning.    . pravastatin (PRAVACHOL) 40 MG tablet Take 40 mg by mouth every morning.     . tiotropium (SPIRIVA) 18 MCG inhalation capsule Place 18 mcg into inhaler and inhale every morning.      No current facility-administered medications for this encounter.    ECOG PERFORMANCE STATUS:  0 - Asymptomatic  REVIEW OF SYSTEMS:  Patient denies any weight loss, fatigue, weakness, fever, chills or night sweats. Patient denies any loss of vision, blurred vision. Patient denies any ringing  of the ears or hearing loss. No irregular heartbeat. Patient denies heart murmur or history of fainting. Patient denies any chest pain or pain radiating to her upper extremities. Patient denies any shortness of breath, difficulty breathing at night, cough or hemoptysis. Patient denies any swelling in the lower legs. Patient denies any nausea vomiting, vomiting of blood, or coffee ground material in the vomitus. Patient denies any stomach pain. Patient states has had normal bowel movements no significant constipation or diarrhea. Patient denies any dysuria, hematuria or significant nocturia. Patient denies any problems walking, swelling in the joints or loss of balance. Patient denies any skin changes, loss of hair or loss of weight. Patient denies any excessive worrying or anxiety or significant depression. Patient denies any problems with insomnia. Patient denies excessive thirst, polyuria, polydipsia. Patient denies any swollen glands, patient denies easy bruising or easy bleeding. Patient denies any recent infections,  allergies or URI. Patient "s visual fields have not changed significantly in recent time.    PHYSICAL EXAM: BP 113/68 mmHg  Pulse 68  Temp(Src) 95.4 F (35.2 C)  Resp 18  Wt 173 lb 6.3 oz (78.65 kg)patient is wheelchair-bound and moderately obese. Well-developed well-nourished patient in NAD. HEENT reveals PERLA, EOMI, discs not visualized.  Oral cavity is clear. No oral mucosal lesions are identified. Neck is clear without evidence of cervical or supraclavicular adenopathy. Lungs are clear to A&P. Cardiac examination is essentially unremarkable with regular rate and rhythm without murmur rub or thrill. Abdomen is benign with no organomegaly or masses noted. Motor sensory and DTR levels are equal and symmetric in the upper and lower extremities. Cranial nerves II through XII are grossly intact. Proprioception is intact. No peripheral adenopathy or edema is identified. No motor or sensory levels are noted. Crude visual fields are within normal range.   LABORATORY DATA: cytology pending    RADIOLOGY RESULTS:PET CT scan and CT scans reviewed   IMPRESSION: probable stage I non-small cell lung carcinoma the right upper lobe in 81 year old gentleman with known prostate cancer with pathology pending  PLAN: at this time like to evaluate pathology reports when they're available. If this is a non-small cell lung cancer would favor SB  RT treatment 5000 cGy in 5 fractions to the area of known tumor involvement. I think we can ignore the level II jugular hypermetabolic nodes in should is small and highly unlikely to be from his lung cancer. Should there be negative cytology would recommend possible CT-guided fine-needle aspiration to definitively set up diagnosis. Risks and benefits of SB RT treatment were reviewed with the patient and his daughter. I've tentatively set up and ordered CT simulation in about a week and a half after his cytology is reviewed. Should he need needle biopsy will order that  accordingly.  I would like to take this opportunity for allowing me to participate in the care of your patient.Armstead Peaks., MD

## 2015-01-14 ENCOUNTER — Encounter: Payer: Self-pay | Admitting: Oncology

## 2015-01-14 LAB — CYTOLOGY - NON PAP

## 2015-01-14 LAB — SURGICAL PATHOLOGY

## 2015-01-14 NOTE — Progress Notes (Signed)
[facility] [phone]  Fax:([phone]) [place]: 1927-04-12  MR#: [id number]  WER#:[id number]  Patient Care Team: Birdie Sons, MD as PCP - General (Family Medicine) Robert Bellow, MD (General Surgery) Dallas Schimke, MD (Internal Medicine) Vilinda Boehringer, MD as Consulting Physician (Internal Medicine)  CHIEF COMPLAINT:  Chief Complaint  Patient presents with  . Follow-up    Oncology History   1.  Prostate cancer.  Original radical prostatectomy.  Later recurrence and radiation treatment.  Later a rising PSA.  Referred for Lupron beginning May 2002. Treatment 5/02 to 5/03. Drug holiday 5/03 to 3/04. Treatment 3/04 to 2/05. Drug holiday 2/05. Restarted 7/05, off again , last dose 01/2010, then on and off again, se earlier notes,  resumed 06/2012.  2.  Previous surgeries include umbilical hernia repair and lysing of adhesions, inguinal hernia repair then breakdown and recurrence.  3.  Hyperlipidemia.  4.  History of cardiac arrhythmia ventricular beats evaluated in the past by Dr. Clayborn Bigness.    5.  Osteoporosis. 6 right upper lobe lung nodule is positive for non-small cell carcinoma of lung stage I disease     Prostate cancer    Oncology Flowsheet 11/23/2014 11/23/2014 11/24/2014 11/24/2014 11/25/2014 01/07/2015 01/11/2015  dexamethasone (DECADRON) IJ - - - - - - -  leuprolide (LUPRON) IM - - - - - - 30 mg  methylPREDNISolone sodium succinate (SOLU-MEDROL) IV 125 mg 40 mg 40 mg 40 mg 40 mg - -  ondansetron (ZOFRAN) IV - - - - - 4 mg -    INTERVAL HISTORY:  81 year old gentleman who has previous history of carcinoma prostate which is castration sensitive prostate cancer.  Patient is on Lupron injection off-and-on.  Patient was admitted in the hospital with history of pneumonia a CT scan revealed right upper lobe lung mass. Prior to admission in the hospital patient was taking care of himself.  Now on oxygen at rest oxygen saturation was 84  percent.  With 2 L of oxygen and improved to 97%.  Patient is using inhalers.  Was also seen by pulmonologist She was referred to me for further evaluation and treatment, consideration January 13, 2015 Patient is here for ongoing evaluation and treatment consideration.  Cough and shortness of breath is gradually improving.  REVIEW OF SYSTEMS:   Gen. status: Patient is on oxygen.  Not in any acute distress. HEENT: No soreness in the mouth.  No cough. No hemoptysis or chest pain Cardiac: No chest pain or paroxysmal nocturnal dyspnea GI: No nausea no vomiting no diarrhea appetite has been stable. Lower extremity no edema. Musculoskeletal system no bony pain Skin: No rash Neurological system no headache no dizziness As per HPI. Otherwise, a complete review of systems is negative.  PAST MEDICAL HISTORY: Past Medical History  Diagnosis Date  . Heart murmur   . COPD (chronic obstructive pulmonary disease)   . High blood cholesterol level   . Irritable bowel syndrome   . Diabetes mellitus without complication   . Osteoporosis   . Incontinence   . COPD (chronic obstructive pulmonary disease) 2000  . Atrial fibrillation   . Cancer 1994    prostate  . Pneumonia   . Hypertension   . Chronic kidney disease     KIDNEY STONES  . GERD (gastroesophageal reflux disease)   . On home oxygen therapy     2L  PRN    PAST SURGICAL HISTORY: Past Surgical History  Procedure Laterality  Date  . Colonoscopy  03/12/13  . Prostate surgery  1994  . Hernia repair  2011  . Surgery for staph infection  1995  . Colonoscopy  2011  . Bone density test      Spine T-spine = -1.7    FAMILY HISTORY Family History  Problem Relation Age of Onset  . Cancer Father     bladder  . Other Sister     brain tumor  . Cancer Brother     colon  . Asthma Mother   . Osteoporosis Mother     ADVANCED DIRECTIVES:  No flowsheet data found.  HEALTH MAINTENANCE: History  Substance Use Topics  . Smoking status:  Former Smoker -- 1.00 packs/day    Types: Cigarettes    Quit date: 05/15/2006  . Smokeless tobacco: Never Used  . Alcohol Use: No      No Known Allergies  Current Outpatient Prescriptions  Medication Sig Dispense Refill  . albuterol (PROVENTIL HFA;VENTOLIN HFA) 108 (90 BASE) MCG/ACT inhaler Inhale 2 puffs into the lungs every 6 (six) hours as needed for wheezing or shortness of breath.     [name] alendronate (FOSAMAX) 70 MG tablet Take 70 mg by mouth once a week. Take on Sunday. Take with a full glass of water on an empty stomach.    [name] amLODipine (NORVASC) 5 MG tablet Take 1 tablet (5 mg total) by mouth daily. (Patient taking differently: Take 5 mg by mouth every morning. ) 30 tablet 1  . aspirin EC 81 MG tablet Take 81 mg by mouth daily.    . Calcium Carbonate-Vitamin D (CALCIUM + D PO) Take 600 mg by mouth 2 (two) times daily.    . cholecalciferol (VITAMIN D) 1000 UNITS tablet Take 2,000 Units by mouth every morning.    . Fluticasone-Salmeterol (ADVAIR DISKUS) 500-50 MCG/DOSE AEPB Inhale 1 puff into the lungs 2 (two) times daily. Rinse and gargle after each use. 60 each 1  . ipratropium-albuterol (DUONEB) 0.5-2.5 (3) MG/3ML SOLN Take 3 mLs by nebulization 4 (four) times daily. 360 mL 1  . Leuprolide Acetate (LUPRON IJ) Inject 1 each as directed every 3 (three) months.     . metFORMIN (GLUCOPHAGE) 500 MG tablet TAKE ONE TABLET BY MOUTH ONCE DAILY 30 tablet 6  . metoprolol (LOPRESSOR) 50 MG tablet Take 1 tablet (50 mg total) by mouth 2 (two) times daily. 60 tablet 1  . Omega-3 Fatty Acids (FISH OIL) 1000 MG CAPS Take 2,000 mg by mouth every morning.    . pravastatin (PRAVACHOL) 40 MG tablet Take 40 mg by mouth every morning.     . tiotropium (SPIRIVA) 18 MCG inhalation capsule Place 18 mcg into inhaler and inhale every morning.      No current facility-administered medications for this visit.    OBJECTIVE:  There were no vitals filed for this visit.   There is no weight on file to  calculate BMI.    ECOG FS:1 - Symptomatic but completely ambulatory  PHYSICAL EXAM: Gen. status: Alert and oriented individual not any acute distress Lungs: Emphysematous chest diminished air entry on both sides Cardiac: Days no irregular heart sounds soft systolic murmur. Abdominal exam revealed normal bowel sounds. The abdomen was soft, non-tender, and without masses, organomegaly, or appreciable enlargement of the abdominal aorta. Lymphatic system: Supraclavicular, cervical, axillary, inguinal lymph nodes are not palpable Examination of the skin revealed no evidence of significant rashes, suspicious appearing nevi or other concerning lesions. Neurologically, the patient was awake, alert, and  oriented to person, place and time. There were no obvious focal neurologic abnormalities. Lower extremity no edema    LAB RESULTS:  Lab Results  Component Value Date   PSA 0.15 01/11/2015     STUDIES: Nm Pet Image Restag (ps) Skull Base To Thigh  01/06/2015   CLINICAL DATA:  Initial treatment strategy for history of prostate cancer with new hilar lymph node and spiculated lung mass.  EXAM: NUCLEAR MEDICINE PET SKULL BASE TO THIGH  TECHNIQUE: 12.7 mCi F-18 FDG was injected intravenously. Full-ring PET imaging was performed from the skull base to thigh after the radiotracer. CT data was obtained and used for attenuation correction and anatomic localization.  FASTING BLOOD GLUCOSE:  Value: 122 mg/dl  COMPARISON:  Chest CT 12/14/2014. Abdominal pelvic 12/21/2006. No prior PET.  FINDINGS: NECK  Hypermetabolism which corresponds to a lateral right level 2 jugular node. This measures 7 mm and a S.U.V. max of 9.9 on image 28.  CHEST  Hypermetabolic spiculated inferior right upper lobe lung mass. 3.9 x 3.2 cm and a S.U.V. max of 13.2 on image 96 of series 4.  No thoracic nodal hypermetabolism ; 1.0 cm right hilar node on the prior exam is not enlarged by size criteria.  Lingular volume loss and atelectasis.  Medially, this is again somewhat more nodular. Measures 2.1 cm and a S.U.V. max of the 3.6 on image 109.  ABDOMEN/PELVIS  No areas of abnormal hypermetabolism.  SKELETON  No abnormal marrow activity.  CT IMAGES PERFORMED FOR ATTENUATION CORRECTION  No cervical adenopathy. Bilateral carotid atherosclerosis. Chest findings deferred to recent diagnostic CT. Mild cardiomegaly with multivessel coronary artery atherosclerosis. A moderate hiatal hernia. Advanced centrilobular emphysema. Bilateral renal cortical thinning with low-density renal lesions which are likely cysts. Normal adrenal glands. Multiple small gallstones. Transverse duodenal 4 mm lipoma. 1.9 cm right common iliac artery ectasia. Left common iliac artery ectasia at 1.5 cm. These are similar back to 2008. Pelvic node dissection. Prostatectomy. Mild pelvic floor laxity. Left femoral neck bone island or enchondroma, chronic.  IMPRESSION: 1. Hypermetabolic inferior right upper lobe lung mass, consistent with primary bronchogenic carcinoma. 2. No evidence of thoracic nodal hypermetabolism. 3. Lingular volume loss and atelectasis. More medially, this has a more nodular, mildly hypermetabolic component. Although this is indeterminate, a synchronous primary bronchogenic carcinoma cannot be excluded. 4. Lateral right level 2 jugular chain hypermetabolic node, suspicious for a somewhat atypical appearance of lung cancer metastasis. 5. Incidental findings, as detailed above.   Electronically Signed   By: Abigail Miyamoto M.D.   On: 01/06/2015 12:07   Dg Chest Port 1 View  01/07/2015   CLINICAL DATA:  CT 12/14/2014  EXAM: DG C-ARM 1-60 MIN - NRPT [facility]; PORTABLE CHEST - 1 VIEW  COMPARISON:  None.  FLUOROSCOPY TIME:  Fluoroscopy Time:  13 minutes 53 seconds  Number of Acquired Images:  1  FINDINGS: Right lung mass noted.  No evidence of pneumothorax post biopsy.  IMPRESSION: Right lung mass.  No evidence of pneumothorax post biopsy.   Electronically Signed   By: Marcello Moores   Register   On: 01/07/2015 14:33   Dg C-arm 1-60 Min-no Report  01/07/2015   CLINICAL DATA: ENB   C-ARM 1-60 MINUTES  Fluoroscopy was utilized by the requesting physician.  No radiographic  interpretation.    Lab Results  Component Value Date   PSA 0.15 01/11/2015    ASSESSMENT: Right upper lobe lung mass.  There is a spiculated lung mass approximately 3.3 cm suggestive of  primary bronchogenic carcinoma. Carcinoma prostate on Lupron therapy last PSA 0.1 in April of 2016 PSA is 0.15 MEDICAL DECISION MAKING:  It upper lobe lung nodule is positive for non-small cell carcinoma of lung. Discussed pathology report today with pathologist.  And I discussed situation with Dr. Donella Stade radiation oncologist. Our most appropriate treatment would be radiation therapy with stereotactic radiation treatment.  Patient does not need any chemotherapy at present time but will be carefully followed.  I will see this patient in 4 weeks after radiation therapy is finished for reevaluation   No matching staging information was found for the patient.  Forest Gleason, MD   01/14/2015 12:20 PM

## 2015-01-17 ENCOUNTER — Encounter: Payer: Self-pay | Admitting: Oncology

## 2015-01-17 NOTE — Progress Notes (Signed)
[place] @ [facility] [phone]  Fax:(336) [place]: 1926/10/01  MR#: [id number]  JYN#:[id number]  Patient Care Team: Birdie Sons, MD as PCP - General (Family Medicine) Robert Bellow, MD (General Surgery) Dallas Schimke, MD (Internal Medicine) Vilinda Boehringer, MD as Consulting Physician (Internal Medicine)  CHIEF COMPLAINT:  Chief Complaint  Patient presents with  . Follow-up    Oncology History   1.  Prostate cancer.  Original radical prostatectomy.  Later recurrence and radiation treatment.  Later a rising PSA.  Referred for Lupron beginning May 2002. Treatment 5/02 to 5/03. Drug holiday 5/03 to 3/04. Treatment 3/04 to 2/05. Drug holiday 2/05. Restarted 7/05, off again , last dose 01/2010, then on and off again, se earlier notes,  resumed 06/2012.  2.  Previous surgeries include umbilical hernia repair and lysing of adhesions, inguinal hernia repair then breakdown and recurrence.  3.  Hyperlipidemia.  4.  History of cardiac arrhythmia ventricular beats evaluated in the past by Dr. Clayborn Bigness.    5.  Osteoporosis. 6 right upper lobe lung nodule is positive for non-small cell carcinoma of lung stage I disease     Prostate cancer    Oncology Flowsheet 11/23/2014 11/23/2014 11/24/2014 11/24/2014 11/25/2014 01/07/2015 01/11/2015  dexamethasone (DECADRON) IJ - - - - - - -  leuprolide (LUPRON) IM - - - - - - 30 mg  methylPREDNISolone sodium succinate (SOLU-MEDROL) IV 125 mg 40 mg 40 mg 40 mg 40 mg - -  ondansetron (ZOFRAN) IV - - - - - 4 mg -    INTERVAL HISTORY:  81 year old gentleman who has previous history of carcinoma prostate which is castration sensitive prostate cancer.  Patient is on Lupron injection off-and-on.  Patient was admitted in the hospital with history of pneumonia a CT scan revealed right upper lobe lung mass. Prior to admission in the hospital patient was taking care of himself.  Now on oxygen at rest oxygen saturation was 84  percent.  With 2 L of oxygen and improved to 97%.  Patient is using inhalers.  Was also seen by pulmonologist She was referred to me for further evaluation and treatment, consideration  July, 2016 Patient is here for ongoing evaluation.  I had long discussion with pathologists Dr. Delorse Lek regarding biopsy report is positive for non-small cell carcinoma. Patient's PSA remains within acceptable range. Lung functions are gradually improving but patient is not a candidate for any surgical therapy I discussed situation with Dr. Donella Stade for possibility of stereotactic radiation therapy Patient may be a candidate for stereotactic radiation therapy REVIEW OF SYSTEMS:   Gen. status: Patient is on oxygen.  Not in any acute distress. HEENT: No soreness in the mouth.  No cough. No hemoptysis or chest pain Cardiac: No chest pain or paroxysmal nocturnal dyspnea GI: No nausea no vomiting no diarrhea appetite has been stable. Lower extremity no edema. Musculoskeletal system no bony pain Skin: No rash Neurological system no headache no dizziness As per HPI. Otherwise, a complete review of systems is negative.  PAST MEDICAL HISTORY: Past Medical History  Diagnosis Date  . Heart murmur   . COPD (chronic obstructive pulmonary disease)   . High blood cholesterol level   . Irritable bowel syndrome   . Diabetes mellitus without complication   . Osteoporosis   . Incontinence   . COPD (chronic obstructive pulmonary disease) 2000  . Atrial fibrillation   . Cancer 1994    prostate  . Pneumonia   .  Hypertension   . Chronic kidney disease     KIDNEY STONES  . GERD (gastroesophageal reflux disease)   . On home oxygen therapy     2L [place] PRN    PAST SURGICAL HISTORY: Past Surgical History  Procedure Laterality Date  . Colonoscopy  03/12/13  . Prostate surgery  1994  . Hernia repair  2011  . Surgery for staph infection  1995  . Colonoscopy  2011  . Bone density test      Spine T-spine = -1.7     FAMILY HISTORY Family History  Problem Relation Age of Onset  . Cancer Father     bladder  . Other Sister     brain tumor  . Cancer Brother     colon  . Asthma Mother   . Osteoporosis Mother     ADVANCED DIRECTIVES:  No flowsheet data found.  HEALTH MAINTENANCE: History  Substance Use Topics  . Smoking status: Former Smoker -- 1.00 packs/day    Types: Cigarettes    Quit date: 05/15/2006  . Smokeless tobacco: Never Used  . Alcohol Use: No      No Known Allergies  Current Outpatient Prescriptions  Medication Sig Dispense Refill  . albuterol (PROVENTIL HFA;VENTOLIN HFA) 108 (90 BASE) MCG/ACT inhaler Inhale 2 puffs into the lungs every 6 (six) hours as needed for wheezing or shortness of breath.     [name] alendronate (FOSAMAX) 70 MG tablet Take 70 mg by mouth once a week. Take on Sunday. Take with a full glass of water on an empty stomach.    [name] amLODipine (NORVASC) 5 MG tablet Take 1 tablet (5 mg total) by mouth daily. (Patient taking differently: Take 5 mg by mouth every morning. ) 30 tablet 1  . aspirin EC 81 MG tablet Take 81 mg by mouth daily.    . Calcium Carbonate-Vitamin D (CALCIUM + D PO) Take 600 mg by mouth 2 (two) times daily.    . cholecalciferol (VITAMIN D) 1000 UNITS tablet Take 2,000 Units by mouth every morning.    . Fluticasone-Salmeterol (ADVAIR DISKUS) 500-50 MCG/DOSE AEPB Inhale 1 puff into the lungs 2 (two) times daily. Rinse and gargle after each use. 60 each 1  . ipratropium-albuterol (DUONEB) 0.5-2.5 (3) MG/3ML SOLN Take 3 mLs by nebulization 4 (four) times daily. 360 mL 1  . Leuprolide Acetate (LUPRON IJ) Inject 1 each as directed every 3 (three) months.     . metFORMIN (GLUCOPHAGE) 500 MG tablet TAKE ONE TABLET BY MOUTH ONCE DAILY 30 tablet 6  . metoprolol (LOPRESSOR) 50 MG tablet Take 1 tablet (50 mg total) by mouth 2 (two) times daily. 60 tablet 1  . Omega-3 Fatty Acids (FISH OIL) 1000 MG CAPS Take 2,000 mg by mouth every morning.    . pravastatin  (PRAVACHOL) 40 MG tablet Take 40 mg by mouth every morning.     . tiotropium (SPIRIVA) 18 MCG inhalation capsule Place 18 mcg into inhaler and inhale every morning.      No current facility-administered medications for this visit.    OBJECTIVE:  Filed Vitals:   01/11/15 1147  BP: 127/78  Pulse: 60  Temp: 96.1 F (35.6 C)     Body mass index is 24.06 kg/(m^2).    ECOG FS:1 - Symptomatic but completely ambulatory  PHYSICAL EXAM: Gen. status: Alert and oriented individual not any acute distress Lungs: Emphysematous chest diminished air entry on both sides Cardiac: Days no irregular heart sounds soft systolic murmur. Abdominal exam revealed  normal bowel sounds. The abdomen was soft, non-tender, and without masses, organomegaly, or appreciable enlargement of the abdominal aorta. Lymphatic system: Supraclavicular, cervical, axillary, inguinal lymph nodes are not palpable Examination of the skin revealed no evidence of significant rashes, suspicious appearing nevi or other concerning lesions. Neurologically, the patient was awake, alert, and oriented to person, place and time. There were no obvious focal neurologic abnormalities. Lower extremity no edema    LAB RESULTS:  Lab Results  Component Value Date   PSA 0.15 01/11/2015     STUDIES: Nm Pet Image Restag (ps) Skull Base To Thigh  01/06/2015   CLINICAL DATA:  Initial treatment strategy for history of prostate cancer with new hilar lymph node and spiculated lung mass.  EXAM: NUCLEAR MEDICINE PET SKULL BASE TO THIGH  TECHNIQUE: 12.7 mCi F-18 FDG was injected intravenously. Full-ring PET imaging was performed from the skull base to thigh after the radiotracer. CT data was obtained and used for attenuation correction and anatomic localization.  FASTING BLOOD GLUCOSE:  Value: 122 mg/dl  COMPARISON:  Chest CT 12/14/2014. Abdominal pelvic 12/21/2006. No prior PET.  FINDINGS: NECK  Hypermetabolism which corresponds to a lateral right level  2 jugular node. This measures 7 mm and a S.U.V. max of 9.9 on image 28.  CHEST  Hypermetabolic spiculated inferior right upper lobe lung mass. 3.9 x 3.2 cm and a S.U.V. max of 13.2 on image 96 of series 4.  No thoracic nodal hypermetabolism ; 1.0 cm right hilar node on the prior exam is not enlarged by size criteria.  Lingular volume loss and atelectasis. Medially, this is again somewhat more nodular. Measures 2.1 cm and a S.U.V. max of the 3.6 on image 109.  ABDOMEN/PELVIS  No areas of abnormal hypermetabolism.  SKELETON  No abnormal marrow activity.  CT IMAGES PERFORMED FOR ATTENUATION CORRECTION  No cervical adenopathy. Bilateral carotid atherosclerosis. Chest findings deferred to recent diagnostic CT. Mild cardiomegaly with multivessel coronary artery atherosclerosis. A moderate hiatal hernia. Advanced centrilobular emphysema. Bilateral renal cortical thinning with low-density renal lesions which are likely cysts. Normal adrenal glands. Multiple small gallstones. Transverse duodenal 4 mm lipoma. 1.9 cm right common iliac artery ectasia. Left common iliac artery ectasia at 1.5 cm. These are similar back to 2008. Pelvic node dissection. Prostatectomy. Mild pelvic floor laxity. Left femoral neck bone island or enchondroma, chronic.  IMPRESSION: 1. Hypermetabolic inferior right upper lobe lung mass, consistent with primary bronchogenic carcinoma. 2. No evidence of thoracic nodal hypermetabolism. 3. Lingular volume loss and atelectasis. More medially, this has a more nodular, mildly hypermetabolic component. Although this is indeterminate, a synchronous primary bronchogenic carcinoma cannot be excluded. 4. Lateral right level 2 jugular chain hypermetabolic node, suspicious for a somewhat atypical appearance of lung cancer metastasis. 5. Incidental findings, as detailed above.   Electronically Signed   By: Abigail Miyamoto M.D.   On: 01/06/2015 12:07   Dg Chest Port 1 View  01/07/2015   CLINICAL DATA:  CT 12/14/2014   EXAM: DG C-ARM 1-60 MIN - NRPT [facility]; PORTABLE CHEST - 1 VIEW  COMPARISON:  None.  FLUOROSCOPY TIME:  Fluoroscopy Time:  13 minutes 53 seconds  Number of Acquired Images:  1  FINDINGS: Right lung mass noted.  No evidence of pneumothorax post biopsy.  IMPRESSION: Right lung mass.  No evidence of pneumothorax post biopsy.   Electronically Signed   By: Marcello Moores  Register   On: 01/07/2015 14:33   Dg C-arm 1-60 Min-no Report  01/07/2015   CLINICAL DATA:  ENB   C-ARM 1-60 MINUTES  Fluoroscopy was utilized by the requesting physician.  No radiographic  interpretation.     ASSESSMENT: Right upper lobe lung mass is positive for non-small cell carcinoma of lung. PET scan has been reviewed independently does not reveal any evidence of mediastinal involvement Patient has stage I disease PSAs in acceptable range patient is also getting Lupron therapy which will be continued for metastases with prostate cancer I discussed situation with pathologists as well as radiation oncologist and patient would be referred to radiation oncologist for stereotactic radiation therapy   MEDICAL DECISION MAKING:   Continue Lupron Current refer patient to radiation therapy evaluation Reevaluate patient in 3 months PSA 0.15   No matching staging information was found for the patient.  Forest Gleason, MD   01/17/2015 9:40 AM

## 2015-01-18 ENCOUNTER — Other Ambulatory Visit: Payer: Self-pay | Admitting: *Deleted

## 2015-01-18 DIAGNOSIS — C61 Malignant neoplasm of prostate: Secondary | ICD-10-CM

## 2015-01-18 NOTE — Addendum Note (Signed)
Addendum  created 01/18/15 1211 by Allean Found, CRNA   Modules edited: Anesthesia Flowsheet

## 2015-01-19 ENCOUNTER — Ambulatory Visit: Payer: Commercial Managed Care - HMO | Admitting: Internal Medicine

## 2015-01-26 ENCOUNTER — Ambulatory Visit
Admission: RE | Admit: 2015-01-26 | Discharge: 2015-01-26 | Disposition: A | Discharge status: Home / Self Care | Payer: Commercial Managed Care - HMO | Source: Ambulatory Visit | Attending: Radiation Oncology | Admitting: Radiation Oncology

## 2015-01-26 DIAGNOSIS — C3411 Malignant neoplasm of upper lobe, right bronchus or lung: Secondary | ICD-10-CM | POA: Diagnosis not present

## 2015-01-26 DIAGNOSIS — Z510 Encounter for antineoplastic radiation therapy: Secondary | ICD-10-CM | POA: Diagnosis not present

## 2015-01-27 DIAGNOSIS — Z510 Encounter for antineoplastic radiation therapy: Secondary | ICD-10-CM | POA: Diagnosis not present

## 2015-02-01 ENCOUNTER — Encounter: Payer: Self-pay | Admitting: Internal Medicine

## 2015-02-01 ENCOUNTER — Ambulatory Visit (INDEPENDENT_AMBULATORY_CARE_PROVIDER_SITE_OTHER): Payer: Commercial Managed Care - HMO | Admitting: Internal Medicine

## 2015-02-01 VITALS — BP 118/72 | HR 59 | Temp 97.4°F | Ht 71.5 in | Wt 175.0 lb

## 2015-02-01 DIAGNOSIS — J431 Panlobular emphysema: Secondary | ICD-10-CM

## 2015-02-01 DIAGNOSIS — R918 Other nonspecific abnormal finding of lung field: Secondary | ICD-10-CM | POA: Diagnosis not present

## 2015-02-01 DIAGNOSIS — R0902 Hypoxemia: Secondary | ICD-10-CM | POA: Diagnosis not present

## 2015-02-01 NOTE — Patient Instructions (Signed)
Follow-up with Dr.Carless Slatten in 3 months - cont with Advair 500/50 -1 puff in the morning, 1 puff in the evening, please gargle and rinse after each use. - continue Spiriva - albuterol inhaler - 2puff every 3-4 hours as needed for shortness of breath\wheezing\recurrent cough (RESCUE MED) - Albuterol nebulizer-one nebulized treatment as needed for shortness of breath/wheezing/recurrent cough every 4 hours (RESUCE MED) - Portable oxygen concentrator, 2 L with exertion

## 2015-02-01 NOTE — Assessment & Plan Note (Signed)
Patient with known COPD. Currently on Advair, Spiriva, albuterol nebulizer, albuterol inhaler. Review of patient's medications with him and explained maintenance versus rescue inhalers (medications ). at this time he has significant amount of emphysema as noted on his CAT scan , cont with Advair to 500/50 pulmonary  Plan: -Cont with Advair 500/50, 1 puff twice a day , gargle and rinse after each use. -Continue with supplemental oxygen, 2 L with exertion -Continue with Spiriva  -continue ambulation as tolerated  -Avoid any forms of secondhand smoke, or any type of tobacco

## 2015-02-01 NOTE — Assessment & Plan Note (Signed)
NSCLC  Following with Rad\Onc  No chemo at this time per heme\onc

## 2015-02-01 NOTE — Assessment & Plan Note (Signed)
Patient with known COPD. Currently on Advair, Spiriva, albuterol nebulizer, albuterol inhaler. Review of patient's medications with him and explained maintenance versus rescue inhalers (medications ). At this time he has significant amount of emphysema as noted on his CAT scan , cont with Advair to 500/50 pulmonary  Plan: -Cont with Advair 500/50, 1 puff twice a day , gargle and rinse after each use. -continue with supplemental oxygen, 2 L with exertion -Continue with Spiriva  -continue ambulation as tolerated  -Avoid any forms of secondhand smoke, or any type of tobacco

## 2015-02-01 NOTE — Progress Notes (Signed)
MRN# [id number] Alan Weaver Nov 04, 1926   CC: Chief Complaint  Alan Weaver presents with  . Follow-up    f/u bronch; prod cough w/white mucus; SOB w/activity;      Brief History: 12/24/14 HPI:   Alan Weaver is a pleasant 81 year old male seen in consultation today for abnormal CT scan, and COPD Optimization. He is also accompanied by his daughter. Alan Weaver states that he's had COPD for a very long time, previously smoked 2 packs per day for 50 years, quit in 2006 ; he is also a Psychologist, sport and exercise. Alan Weaver was recently hospitalized for pneumonia at the end of May , since then has been on supplemental oxygen , 2 L with exertion. Today we'll from the lobby to the Alan Weaver room he desatted down to 85%. Alan Weaver also stated he recently had teeth pulled in his right and left upper jaw. Today he admits to chronic shortness of breath , mild cough , usually in the morning, mildly productive with clear sputum.  Alan Weaver also had a recent CT scan that shows a right hilar mass ; Alan Weaver has a history of prostate cancer followed by [facility] oncology. Alan Weaver denies rapid weight loss or night sweats. Plan: -Advair 500/50, 1 puff twice a day , gargle and rinse after each use. -continue with supplemental oxygen, 2 L with exertion, Alan Weaver has evaluation with DME company. -Continue with Spiriva  -continue ambulation as tolerated  -Avoid any forms of secondhand smoke, or any type of tobacco Lung mass Right upper lobe spiculated mass 3.3 x 4 cm 4 mm opacity along right minor fissure 1.7 by 2 cm nodular opacity over the lingular ( may represent rounded Atelectasis)  1 cm right hilar lymph node  Alan Weaver with known history of pancreatic cancer, high suspicion for metastases along with primary lung malignancy. Further evaluation as dictated by [facility] oncology. If Alan Weaver requests further diagnostic workup , may need to consider PET scan followed by bronchoscopy for biopsy.  Events since last clinic visit: Alan Weaver  presents today for a follow up visit. Since his las visit he has had ENB Bronch of the RUL lesion and followed with up radiation oncology. Today states that he is doing well overall but still with cough with white sputum and sob with exertion, but not worst than basline. He will start Radiation therapy (5 sessions) starting on 02/10/15.    Medication:   Current Outpatient Rx  Name  Route  Sig  Dispense  Refill  . albuterol (PROVENTIL HFA;VENTOLIN HFA) 108 (90 BASE) MCG/ACT inhaler   Inhalation   Inhale 2 puffs into the lungs every 6 (six) hours as needed for wheezing or shortness of breath.          Alan Weaver alendronate (FOSAMAX) 70 MG tablet   Oral   Take 70 mg by mouth once a week. Take on Sunday. Take with a full glass of water on an empty stomach.         Alan Weaver amLODipine (NORVASC) 5 MG tablet   Oral   Take 1 tablet (5 mg total) by mouth daily. Alan Weaver taking differently: Take 5 mg by mouth every morning.    30 tablet   1   . aspirin EC 81 MG tablet   Oral   Take 81 mg by mouth daily.         . Calcium Carbonate-Vitamin D (CALCIUM + D PO)   Oral   Take 600 mg by mouth 2 (two) times daily.         . cholecalciferol (VITAMIN  D) 1000 UNITS tablet   Oral   Take 2,000 Units by mouth every morning.         . Fluticasone-Salmeterol (ADVAIR DISKUS) 500-50 MCG/DOSE AEPB   Inhalation   Inhale 1 puff into the lungs 2 (two) times daily. Rinse and gargle after each use.   60 each   1   . ipratropium-albuterol (DUONEB) 0.5-2.5 (3) MG/3ML SOLN   Nebulization   Take 3 mLs by nebulization 4 (four) times daily.   360 mL   1   . Leuprolide Acetate (LUPRON IJ)   Injection   Inject 1 each as directed every 3 (three) months.          . metFORMIN (GLUCOPHAGE) 500 MG tablet      TAKE ONE TABLET BY MOUTH ONCE DAILY   30 tablet   6   . metoprolol (LOPRESSOR) 50 MG tablet   Oral   Take 1 tablet (50 mg total) by mouth 2 (two) times daily.   60 tablet   1   . Omega-3 Fatty  Acids (FISH OIL) 1000 MG CAPS   Oral   Take 2,000 mg by mouth every morning.         . pravastatin (PRAVACHOL) 40 MG tablet   Oral   Take 40 mg by mouth every morning.          . tiotropium (SPIRIVA) 18 MCG inhalation capsule   Inhalation   Place 18 mcg into inhaler and inhale every morning.             Review of Systems: Gen:  Denies  fever, sweats, chills HEENT: Denies blurred vision, double vision, ear pain, eye pain, hearing loss, nose bleeds, sore throat Cvc:  No dizziness, chest pain or heaviness Resp:   Admits AS:[id number] sob and sputum production, not worsening.  Gi: Denies swallowing difficulty, stomach pain, nausea or vomiting, diarrhea, constipation, bowel incontinence Gu:  Denies bladder incontinence, burning urine Ext:   No Joint pain, stiffness or swelling Skin: No skin rash, easy bruising or bleeding or hives Endoc:  No polyuria, polydipsia , polyphagia or weight change Other:  All other systems negative  Allergies:  Review of Alan Weaver's allergies indicates no known allergies.  Physical Examination:  VS: BP 118/72 mmHg  Pulse 59  Temp(Src) 97.4 F (36.3 C) (Oral)  Ht 5' 11.5" (1.816 m)  Wt 175 lb (79.379 kg)  BMI 24.07 kg/m2  SpO2 92%  General Appearance: No distress  HEENT: PERRLA, no ptosis, no other lesions noticed Pulmonary:normal breath sounds., diaphragmatic excursion normal.No wheezing, No rales   Cardiovascular:  Normal S1,S2.  No m/r/g.     Abdomen:Exam: Benign, Soft, non-tender, No masses  Skin:   warm, no rashes, no ecchymosis  Extremities: normal, no cyanosis, clubbing, warm with normal capillary refill.      Biopsy result from transbronch forcepts 01/07/15 DIAGNOSIS:  A. LUNG, RIGHT UPPER LOBE; ENB FORCEPS BIOPSY:  - NON-SMALL CELL CARCINOMA, FAVOR SQUAMOUS CELL CARCINOMA.   Note:  A cell block was included in the interpretation of this case. Specimen  reviewed in conjunction with [id number] with IHC results.      Assessment and  Plan:81 yo with COPD, s\p bronch with biopsy for RUL mass, found to be NSCLC Hypoxia  New onset since recent hospitalization.   I believe his need for oxygen is multifactorial: Recent pneumonia, atelectasis, severe emphysema , lung mass , and deconditioning.     Plan:  continue with supplemental oxygen to maintain saturations greater than  88%      Chronic obstructive pulmonary emphysema Alan Weaver with known COPD. Currently on Advair, Spiriva, albuterol nebulizer, albuterol inhaler. Review of Alan Weaver's medications with him and explained maintenance versus rescue inhalers (medications ). at this time he has significant amount of emphysema as noted on his CAT scan , cont with Advair to 500/50 pulmonary  Plan: -Cont with Advair 500/50, 1 puff twice a day , gargle and rinse after each use. -Continue with supplemental oxygen, 2 L with exertion -Continue with Spiriva  -continue ambulation as tolerated  -Avoid any forms of secondhand smoke, or any type of tobacco    COPD (chronic obstructive pulmonary disease)  Alan Weaver with known COPD. Currently on Advair, Spiriva, albuterol nebulizer, albuterol inhaler. Review of Alan Weaver's medications with him and explained maintenance versus rescue inhalers (medications ). At this time he has significant amount of emphysema as noted on his CAT scan , cont with Advair to 500/50 pulmonary  Plan: -Cont with Advair 500/50, 1 puff twice a day , gargle and rinse after each use. -continue with supplemental oxygen, 2 L with exertion -Continue with Spiriva  -continue ambulation as tolerated  -Avoid any forms of secondhand smoke, or any type of tobacco    Lung mass NSCLC  Following with Rad\Onc  No chemo at this time per heme\onc    Updated Medication List Outpatient Encounter Prescriptions as of 02/01/2015  Medication Sig  . albuterol (PROVENTIL HFA;VENTOLIN HFA) 108 (90 BASE) MCG/ACT inhaler Inhale 2 puffs into the lungs every 6 (six) hours as  needed for wheezing or shortness of breath.   Alan Weaver alendronate (FOSAMAX) 70 MG tablet Take 70 mg by mouth once a week. Take on Sunday. Take with a full glass of water on an empty stomach.  Alan Weaver amLODipine (NORVASC) 5 MG tablet Take 1 tablet (5 mg total) by mouth daily. (Alan Weaver taking differently: Take 5 mg by mouth every morning. )  . aspirin EC 81 MG tablet Take 81 mg by mouth daily.  . Calcium Carbonate-Vitamin D (CALCIUM + D PO) Take 600 mg by mouth 2 (two) times daily.  . cholecalciferol (VITAMIN D) 1000 UNITS tablet Take 2,000 Units by mouth every morning.  . Fluticasone-Salmeterol (ADVAIR DISKUS) 500-50 MCG/DOSE AEPB Inhale 1 puff into the lungs 2 (two) times daily. Rinse and gargle after each use.  . ipratropium-albuterol (DUONEB) 0.5-2.5 (3) MG/3ML SOLN Take 3 mLs by nebulization 4 (four) times daily.  Alan Weaver Leuprolide Acetate (LUPRON IJ) Inject 1 each as directed every 3 (three) months.   . metFORMIN (GLUCOPHAGE) 500 MG tablet TAKE ONE TABLET BY MOUTH ONCE DAILY  . metoprolol (LOPRESSOR) 50 MG tablet Take 1 tablet (50 mg total) by mouth 2 (two) times daily.  . Omega-3 Fatty Acids (FISH OIL) 1000 MG CAPS Take 2,000 mg by mouth every morning.  . pravastatin (PRAVACHOL) 40 MG tablet Take 40 mg by mouth every morning.   . tiotropium (SPIRIVA) 18 MCG inhalation capsule Place 18 mcg into inhaler and inhale every morning.    No facility-administered encounter medications on file as of 02/01/2015.    Orders for this visit: No orders of the defined types were placed in this encounter.    Thank  you for the visitation and for allowing  [facility] to assist in the care of your Alan Weaver. Our recommendations are noted above.  Please contact us if we can be of further service.  Vilinda Boehringer, MD [facility] Office Number: [phone]

## 2015-02-01 NOTE — Assessment & Plan Note (Signed)
New onset since recent hospitalization.   I believe his need for oxygen is multifactorial: Recent pneumonia, atelectasis, severe emphysema , lung mass , and deconditioning.     Plan:  continue with supplemental oxygen to maintain saturations greater than 88%

## 2015-02-05 ENCOUNTER — Other Ambulatory Visit: Payer: Self-pay | Admitting: Family Medicine

## 2015-02-05 DIAGNOSIS — I10 Essential (primary) hypertension: Secondary | ICD-10-CM

## 2015-02-05 MED ORDER — AMLODIPINE BESYLATE 5 MG PO TABS: 5.0000 mg | ORAL_TABLET | Freq: Every day | ORAL | Status: DC

## 2015-02-08 ENCOUNTER — Ambulatory Visit: Payer: Commercial Managed Care - HMO | Admitting: Radiation Oncology

## 2015-02-08 DIAGNOSIS — Z510 Encounter for antineoplastic radiation therapy: Secondary | ICD-10-CM | POA: Diagnosis not present

## 2015-02-09 ENCOUNTER — Ambulatory Visit (INDEPENDENT_AMBULATORY_CARE_PROVIDER_SITE_OTHER): Payer: Commercial Managed Care - HMO | Admitting: Family Medicine

## 2015-02-09 ENCOUNTER — Encounter: Payer: Self-pay | Admitting: Family Medicine

## 2015-02-09 VITALS — BP 122/60 | HR 65 | Temp 98.0°F | Resp 16 | Wt 175.8 lb

## 2015-02-09 DIAGNOSIS — C3491 Malignant neoplasm of unspecified part of right bronchus or lung: Secondary | ICD-10-CM | POA: Diagnosis not present

## 2015-02-09 DIAGNOSIS — G629 Polyneuropathy, unspecified: Secondary | ICD-10-CM | POA: Diagnosis not present

## 2015-02-09 DIAGNOSIS — J439 Emphysema, unspecified: Secondary | ICD-10-CM | POA: Diagnosis not present

## 2015-02-09 DIAGNOSIS — C61 Malignant neoplasm of prostate: Secondary | ICD-10-CM | POA: Diagnosis not present

## 2015-02-09 DIAGNOSIS — R0902 Hypoxemia: Secondary | ICD-10-CM

## 2015-02-09 DIAGNOSIS — E1142 Type 2 diabetes mellitus with diabetic polyneuropathy: Secondary | ICD-10-CM | POA: Diagnosis not present

## 2015-02-09 LAB — POCT GLYCOSYLATED HEMOGLOBIN (HGB A1C): Hemoglobin A1C: 6.6

## 2015-02-09 NOTE — Progress Notes (Signed)
Patient: Alan Weaver Male    DOB: 27-Aug-1926   81 y.o.   MRN: [id number] Visit Date: 02/09/2015  Today's Provider: Vernie Murders, PA   Chief Complaint  Patient presents with  . Follow-up    COPD   Subjective:    Shortness of Breath This is a chronic problem. The current episode started more than 1 year ago. The problem occurs daily. The problem has been gradually worsening. Associated symptoms include leg pain and wheezing. Pertinent negatives include no chest pain, headaches, sore throat, sputum production or syncope. The symptoms are aggravated by any activity. His past medical history is significant for COPD.  Patient is on oxygen. Dr. Stevenson Clinch (pulmonologist) biopsied a mass in the RUL and diagnosed non-small cell carcinoma and will start radiation treatments this week with Dr. Baruch Gouty (for 5 treatment - 2 a week).       No Known Allergies Previous Medications   ALBUTEROL (PROVENTIL HFA;VENTOLIN HFA) 108 (90 BASE) MCG/ACT INHALER    Inhale 2 puffs into the lungs every 6 (six) hours as needed for wheezing or shortness of breath.    ALENDRONATE (FOSAMAX) 70 MG TABLET    Take 70 mg by mouth once a week. Take on Sunday. Take with a full glass of water on an empty stomach.   AMLODIPINE (NORVASC) 5 MG TABLET    Take 1 tablet (5 mg total) by mouth daily.   ASPIRIN EC 81 MG TABLET    Take 81 mg by mouth daily.   CALCIUM CARBONATE-VITAMIN D (CALCIUM + D PO)    Take 600 mg by mouth 2 (two) times daily.   CHOLECALCIFEROL (VITAMIN D) 1000 UNITS TABLET    Take 2,000 Units by mouth every morning.   FLUTICASONE-SALMETEROL (ADVAIR DISKUS) 500-50 MCG/DOSE AEPB    Inhale 1 puff into the lungs 2 (two) times daily. Rinse and gargle after each use.   IPRATROPIUM-ALBUTEROL (DUONEB) 0.5-2.5 (3) MG/3ML SOLN    Take 3 mLs by nebulization 4 (four) times daily.   LEUPROLIDE ACETATE (LUPRON IJ)    Inject 1 each as directed every 3 (three) months.    METFORMIN (GLUCOPHAGE) 500 MG TABLET    TAKE  ONE TABLET BY MOUTH ONCE DAILY   METOPROLOL (LOPRESSOR) 50 MG TABLET    Take 1 tablet (50 mg total) by mouth 2 (two) times daily.   OMEGA-3 FATTY ACIDS (FISH OIL) 1000 MG CAPS    Take 2,000 mg by mouth every morning.   PRAVASTATIN (PRAVACHOL) 40 MG TABLET    Take 40 mg by mouth every morning.    TIOTROPIUM (SPIRIVA) 18 MCG INHALATION CAPSULE    Place 18 mcg into inhaler and inhale every morning.     Review of Systems  Constitutional: Negative for appetite change.  HENT: Negative.  Negative for sore throat.   Eyes: Negative.   Respiratory: Positive for cough, shortness of breath and wheezing. Negative for sputum production and chest tightness.   Cardiovascular: Negative.  Negative for chest pain, palpitations and syncope.  Gastrointestinal: Negative.   Endocrine: Negative.   Genitourinary: Negative.   Musculoskeletal: Negative.   Allergic/Immunologic: Negative.   Neurological: Positive for weakness. Negative for dizziness, syncope and headaches.  Hematological: Negative.   Psychiatric/Behavioral: Negative.     Social History  Substance Use Topics  . Smoking status: Former Smoker -- 1.00 packs/day    Types: Cigarettes    Quit date: 05/15/2006  . Smokeless tobacco: Never Used  . Alcohol Use: No  Objective:   BP 122/60 mmHg  Pulse 65  Temp(Src) 98 F (36.7 C) (Oral)  Resp 16  Wt 175 lb 12.8 oz (79.742 kg)  Physical Exam      Assessment & Plan:     1. DM type 2 with diabetic peripheral neuropathy Feels fatigued and FBS up to 150 range recently since diagnosis of lung cancer. Will continue present Metformin dosage and recheck in 3 months. Hgb A1C 6.6% today. Continue to check FBS daily. - POCT HgB A1C  2. Hypoxia Improved energy level and dyspnea since starting the oxygen at 2 LPM with exertion. States Dr. Stevenson Clinch (pulmonologist) increased his Advair to 500/50 BID. Needs less Ventolin since starting the oxygen. Will continue regular follow up with Dr. Stevenson Clinch.  3.  Pulmonary emphysema, unspecified emphysema type Dyspnea improved with use of oxygen and increase in Advair to 500/50 mcg one inhalation BID with Duoneb up to QID prn only. Continue follow up with Dr. Stevenson Clinch (pulmonologist).  4. Prostate cancer Still getting Lupron injection every 3 months by Dr. Oliva Bustard.  5. Non-small cell cancer of right lung Diagnosed per RUL biopsy and has started radiation treatment 2 times a week for 5 total by Dr. Baruch Gouty.       Vernie Murders, PA  [place]

## 2015-02-10 ENCOUNTER — Ambulatory Visit
Admission: RE | Admit: 2015-02-10 | Discharge: 2015-02-10 | Disposition: A | Discharge status: Home / Self Care | Payer: Commercial Managed Care - HMO | Source: Ambulatory Visit | Attending: Radiation Oncology | Admitting: Radiation Oncology

## 2015-02-10 DIAGNOSIS — Z510 Encounter for antineoplastic radiation therapy: Secondary | ICD-10-CM | POA: Diagnosis not present

## 2015-02-11 ENCOUNTER — Ambulatory Visit: Payer: Commercial Managed Care - HMO | Admitting: Radiation Oncology

## 2015-02-12 ENCOUNTER — Ambulatory Visit
Admission: RE | Admit: 2015-02-12 | Discharge: 2015-02-12 | Disposition: A | Discharge status: Home / Self Care | Payer: Commercial Managed Care - HMO | Source: Ambulatory Visit | Attending: Radiation Oncology | Admitting: Radiation Oncology

## 2015-02-12 DIAGNOSIS — C3490 Malignant neoplasm of unspecified part of unspecified bronchus or lung: Secondary | ICD-10-CM | POA: Diagnosis not present

## 2015-02-12 DIAGNOSIS — E119 Type 2 diabetes mellitus without complications: Secondary | ICD-10-CM | POA: Diagnosis not present

## 2015-02-12 DIAGNOSIS — Z510 Encounter for antineoplastic radiation therapy: Secondary | ICD-10-CM | POA: Diagnosis not present

## 2015-02-12 DIAGNOSIS — I10 Essential (primary) hypertension: Secondary | ICD-10-CM | POA: Diagnosis not present

## 2015-02-12 DIAGNOSIS — J449 Chronic obstructive pulmonary disease, unspecified: Secondary | ICD-10-CM | POA: Diagnosis not present

## 2015-02-15 ENCOUNTER — Ambulatory Visit
Admission: RE | Admit: 2015-02-15 | Discharge: 2015-02-15 | Disposition: A | Discharge status: Home / Self Care | Payer: Commercial Managed Care - HMO | Source: Ambulatory Visit | Attending: Radiation Oncology | Admitting: Radiation Oncology

## 2015-02-15 ENCOUNTER — Ambulatory Visit: Payer: Commercial Managed Care - HMO | Admitting: Radiation Oncology

## 2015-02-15 DIAGNOSIS — Z510 Encounter for antineoplastic radiation therapy: Secondary | ICD-10-CM | POA: Diagnosis not present

## 2015-02-17 ENCOUNTER — Ambulatory Visit
Admission: RE | Admit: 2015-02-17 | Discharge: 2015-02-17 | Disposition: A | Discharge status: Home / Self Care | Payer: Commercial Managed Care - HMO | Source: Ambulatory Visit | Attending: Radiation Oncology | Admitting: Radiation Oncology

## 2015-02-17 DIAGNOSIS — Z510 Encounter for antineoplastic radiation therapy: Secondary | ICD-10-CM | POA: Diagnosis not present

## 2015-02-18 ENCOUNTER — Ambulatory Visit: Payer: Commercial Managed Care - HMO | Admitting: Radiation Oncology

## 2015-02-18 ENCOUNTER — Other Ambulatory Visit: Payer: Self-pay

## 2015-02-18 MED ORDER — AMLODIPINE BESYLATE 5 MG PO TABS: 5.0000 mg | ORAL_TABLET | Freq: Every day | ORAL | Status: DC

## 2015-02-18 NOTE — Telephone Encounter (Signed)
Norvasc 5 mg e scribed to [facility]. It appears RX was accidentally printed instead of e scribed on 02/05/15. Request received from pharmacy.

## 2015-02-22 ENCOUNTER — Ambulatory Visit
Admission: RE | Admit: 2015-02-22 | Discharge: 2015-02-22 | Disposition: A | Discharge status: Home / Self Care | Payer: Commercial Managed Care - HMO | Source: Ambulatory Visit | Attending: Radiation Oncology | Admitting: Radiation Oncology

## 2015-02-22 ENCOUNTER — Ambulatory Visit: Payer: Commercial Managed Care - HMO | Admitting: Radiation Oncology

## 2015-02-22 DIAGNOSIS — Z510 Encounter for antineoplastic radiation therapy: Secondary | ICD-10-CM | POA: Diagnosis not present

## 2015-03-05 ENCOUNTER — Encounter: Payer: Self-pay | Admitting: Internal Medicine

## 2015-03-16 ENCOUNTER — Other Ambulatory Visit: Payer: Self-pay

## 2015-03-16 ENCOUNTER — Other Ambulatory Visit: Payer: Self-pay | Admitting: Internal Medicine

## 2015-03-16 ENCOUNTER — Other Ambulatory Visit: Payer: Self-pay | Admitting: Family Medicine

## 2015-03-16 DIAGNOSIS — J449 Chronic obstructive pulmonary disease, unspecified: Secondary | ICD-10-CM

## 2015-03-16 MED ORDER — IPRATROPIUM-ALBUTEROL 0.5-2.5 (3) MG/3ML IN SOLN: 3.0000 mL | Freq: Four times a day (QID) | RESPIRATORY_TRACT | Status: DC

## 2015-03-16 NOTE — Telephone Encounter (Signed)
Refill request received from [facility] requesting Duoneb 0.5-2.5

## 2015-03-18 NOTE — Telephone Encounter (Signed)
Dennis sent RX to pharmacy.

## 2015-03-31 ENCOUNTER — Ambulatory Visit
Admission: RE | Admit: 2015-03-31 | Discharge: 2015-03-31 | Disposition: A | Discharge status: Home / Self Care | Payer: Commercial Managed Care - HMO | Source: Ambulatory Visit | Attending: Radiation Oncology | Admitting: Radiation Oncology

## 2015-03-31 ENCOUNTER — Encounter: Payer: Self-pay | Admitting: Radiation Oncology

## 2015-03-31 VITALS — BP 113/66 | HR 68 | Temp 95.1°F | Resp 20 | Wt 170.5 lb

## 2015-03-31 DIAGNOSIS — C3411 Malignant neoplasm of upper lobe, right bronchus or lung: Secondary | ICD-10-CM

## 2015-03-31 NOTE — Progress Notes (Signed)
Radiation Oncology Follow up Note  Name: Alan Weaver   Date:   03/31/2015 MRN:  [id number] DOB: Apr 07, 1927    This 81 y.o. male presents to the clinic today for follow-up for SB RT to the right upper lobe.  REFERRING PROVIDER: Birdie Sons, MD  HPI: Patient is an 81 year old male now one month out having completed SB RT to his right upper lobe for. A stage I non-small cell lung cancer of the right upper lobe. Patient has known prostate cancer. He is seen today in routine follow-up doing fairly well no change in his overall pulmonary status as far as the patient is concerned he continues to be nasal oxygen dependent. He specifically denies  hemoptysis or chest tightness. Does have a slight nonproductive cough. Patient is on multiple inhalers including Advair Spiriva  .COMPLICATIONS OF TREATMENT: None   FOLLOW UP COMPLIANCE: keeps appointments   PHYSICAL EXAM:  BP 113/66 mmHg  Pulse 68  Temp(Src) 95.1 F (35.1 C)  Resp 20  Wt 170 lb 8.4 oz (77.35 kg) Elderly male with nasal oxygen wheelchair-bound in NAD. Well-developed well-nourished patient in NAD. HEENT reveals PERLA, EOMI, discs not visualized.  Oral cavity is clear. No oral mucosal lesions are identified. Neck is clear without evidence of cervical or supraclavicular adenopathy. Lungs are clear to A&P. Cardiac examination is essentially unremarkable with regular rate and rhythm without murmur rub or thrill. Abdomen is benign with no organomegaly or masses noted. Motor sensory and DTR levels are equal and symmetric in the upper and lower extremities. Cranial nerves II through XII are grossly intact. Proprioception is intact. No peripheral adenopathy or edema is identified. No motor or sensory levels are noted. Crude visual fields are within normal range.  RADIOLOGY RESULTS: CT scan will be ordered in the next 2-3 months  PLAN: Present time patient has tolerated his SB RT extremely well with very little side effects or  complaints. I am please was overall progress. He sees medical oncology next month. Would like a CT scan about 2-3 months for my review and will obtain that prior to his next follow-up visit. Patient knows to call sooner with any concerns.  I would like to take this opportunity for allowing me to participate in the care of your patient.Armstead Peaks., MD

## 2015-04-23 ENCOUNTER — Other Ambulatory Visit: Payer: Self-pay | Admitting: Family Medicine

## 2015-05-10 ENCOUNTER — Other Ambulatory Visit
Admission: RE | Admit: 2015-05-10 | Discharge: 2015-05-10 | Disposition: A | Discharge status: Home / Self Care | Payer: Commercial Managed Care - HMO | Source: Ambulatory Visit | Attending: Internal Medicine | Admitting: Internal Medicine

## 2015-05-10 ENCOUNTER — Ambulatory Visit (INDEPENDENT_AMBULATORY_CARE_PROVIDER_SITE_OTHER): Payer: Commercial Managed Care - HMO | Admitting: Internal Medicine

## 2015-05-10 ENCOUNTER — Encounter: Payer: Self-pay | Admitting: Internal Medicine

## 2015-05-10 VITALS — BP 118/72 | HR 78 | Ht 72.0 in | Wt 169.0 lb

## 2015-05-10 DIAGNOSIS — R0902 Hypoxemia: Secondary | ICD-10-CM

## 2015-05-10 DIAGNOSIS — C3491 Malignant neoplasm of unspecified part of right bronchus or lung: Secondary | ICD-10-CM | POA: Diagnosis not present

## 2015-05-10 DIAGNOSIS — J432 Centrilobular emphysema: Secondary | ICD-10-CM

## 2015-05-10 LAB — BASIC METABOLIC PANEL
Anion gap: 7 (ref 5–15)
BUN: 19 mg/dL (ref 6–20)
CALCIUM: 9.6 mg/dL (ref 8.9–10.3)
CO2: 34 mmol/L — ABNORMAL HIGH (ref 22–32)
Chloride: 102 mmol/L (ref 101–111)
Creatinine, Ser: 0.91 mg/dL (ref 0.61–1.24)
GFR calc Af Amer: >60 mL/min (ref >60)
Glucose, Bld: 120 mg/dL — ABNORMAL HIGH (ref 65–99)
Potassium: 5.2 mmol/L — ABNORMAL HIGH (ref 3.5–5.1)
SODIUM: 143 mmol/L (ref 135–145)

## 2015-05-10 MED ORDER — PREDNISONE 20 MG PO TABS: 20.0000 mg | ORAL_TABLET | Freq: Every day | ORAL | Status: DC

## 2015-05-10 NOTE — Assessment & Plan Note (Signed)
Patient with known COPD. Currently on Advair, Spiriva, albuterol nebulizer, albuterol inhaler. Review of patient's medications with him and explained maintenance versus rescue inhalers (medications ). At this time he has significant amount of emphysema as noted on his CAT scan , cont with Advair to 500/50 pulmonary  Plan: -Cont with Advair 500/50, 1 puff twice a day , gargle and rinse after each use. -continue with supplemental oxygen, 2 L continuously -Continue with Spiriva  -continue ambulation as tolerated  -Avoid any forms of secondhand smoke, or any type of tobacco

## 2015-05-10 NOTE — Assessment & Plan Note (Signed)
Currently being followed by Heme/Onc Has completed SBRT  Plan - repeat CT chest in 1-2 weeks.

## 2015-05-10 NOTE — Assessment & Plan Note (Addendum)
I believe his need for oxygen is multifactorial: atelectasis, severe emphysema , lung mass , and deconditioning.  At this time he is requiring O2 continuously, will plan for repeat CT chest - ? Pneumonitis since radiation.   Plan: continue with supplemental oxygen to maintain saturations greater than 88% continuously Prednisone '20mg'$  x 5 days.  Repeat CT chest with contrast in the next 1-2 weeks.

## 2015-05-10 NOTE — Progress Notes (Signed)
MRN# [id number] Alan Weaver Nov 18, 1926   CC: Chief Complaint  Patient presents with  . Follow-up    breathing worse since last OV: has to wear O2 with any excertion; tingling feeling on LT side; cough sometimes in am;      Brief History: 12/24/14 HPI:   Patient is a pleasant 81 year old male seen in consultation today for abnormal CT scan, and COPD Optimization. He is also accompanied by his daughter. Patient states that he's had COPD for a very long time, previously smoked 2 packs per day for 50 years, quit in 2006 ; he is also a Psychologist, sport and exercise. patient was recently hospitalized for pneumonia at the end of May , since then has been on supplemental oxygen , 2 L with exertion. Today we'll from the lobby to the patient room he desatted down to 85%. Patient also stated he recently had teeth pulled in his right and left upper jaw. Today he admits to chronic shortness of breath , mild cough , usually in the morning, mildly productive with clear sputum.  Patient also had a recent CT scan that shows a right hilar mass ; patient has a history of prostate cancer followed by [facility] oncology. Patient denies rapid weight loss or night sweats. Plan: -Advair 500/50, 1 puff twice a day , gargle and rinse after each use. -continue with supplemental oxygen, 2 L with exertion, patient has evaluation with DME company. -Continue with Spiriva  -continue ambulation as tolerated  -Avoid any forms of secondhand smoke, or any type of tobacco Lung mass Right upper lobe spiculated mass 3.3 x 4 cm 4 mm opacity along right minor fissure 1.7 by 2 cm nodular opacity over the lingular ( may represent rounded Atelectasis)  1 cm right hilar lymph node  Patient with known history of pancreatic cancer, high suspicion for metastases along with primary lung malignancy. Further evaluation as dictated by [facility] oncology. If patient requests further diagnostic workup , may need to consider PET scan followed by  bronchoscopy for biopsy.  02/01/2015 Patient presents today for a follow up visit. Since his las visit he has had ENB Bronch of the RUL lesion and followed with up radiation oncology. Today states that he is doing well overall but still with cough with white sputum and sob with exertion, but not worst than basline. He will start Radiation therapy (5 sessions) starting on 02/10/15. Plan - cont with inhaler, cont with O2, cont with oncology f\u  Events since last clinic visit: Completed SBRT on 02/22/15 for RUL mass. Today states that breathing has been a little more labored since his last visit, with gradual increase with DOE. He needs O2 all the time now, previously could move around the house without O2.  Been using rescue inhaler about 2-3 times per day over the last 2 weeks.    Medication:   Current Outpatient Rx  Name  Route  Sig  Dispense  Refill  . ADVAIR DISKUS 500-50 MCG/DOSE AEPB      INHALE ONE DOSE BY MOUTH TWICE DAILY. RINSE AND GARGLE AFTER EACH USE.   60 each   0   . albuterol (PROVENTIL HFA;VENTOLIN HFA) 108 (90 BASE) MCG/ACT inhaler   Inhalation   Inhale 2 puffs into the lungs every 6 (six) hours as needed for wheezing or shortness of breath.          [name] alendronate (FOSAMAX) 70 MG tablet   Oral   Take 70 mg by mouth once a week. Take on Sunday. Take  with a full glass of water on an empty stomach.         [name] amLODipine (NORVASC) 5 MG tablet      TAKE ONE TABLET BY MOUTH ONCE DAILY   30 tablet   3   . aspirin EC 81 MG tablet   Oral   Take 81 mg by mouth as needed.          . Calcium Carbonate-Vitamin D (CALCIUM + D PO)   Oral   Take 600 mg by mouth 2 (two) times daily.         . cholecalciferol (VITAMIN D) 1000 UNITS tablet   Oral   Take 2,000 Units by mouth every morning.         [name] ipratropium-albuterol (DUONEB) 0.5-2.5 (3) MG/3ML SOLN   Nebulization   Take 3 mLs by nebulization 4 (four) times daily.   360 mL   1   . Leuprolide Acetate  (LUPRON IJ)   Injection   Inject 1 each as directed every 3 (three) months.          . metFORMIN (GLUCOPHAGE) 500 MG tablet      TAKE ONE TABLET BY MOUTH ONCE DAILY   30 tablet   6   . metoprolol (LOPRESSOR) 50 MG tablet   Oral   Take 1 tablet (50 mg total) by mouth 2 (two) times daily.   60 tablet   1   . Omega-3 Fatty Acids (FISH OIL) 1000 MG CAPS   Oral   Take 2,000 mg by mouth every morning.         . pravastatin (PRAVACHOL) 40 MG tablet   Oral   Take 40 mg by mouth every morning.          . tiotropium (SPIRIVA) 18 MCG inhalation capsule   Inhalation   Place 18 mcg into inhaler and inhale every morning.             Review of Systems: Gen:  Denies  fever, sweats, chills HEENT: Denies blurred vision, double vision, ear pain, eye pain, hearing loss, nose bleeds, sore throat Cvc:  No dizziness, chest pain or heaviness Resp:   Admits ZG:[id number] sob and sputum production Gi: Denies swallowing difficulty, stomach pain, nausea or vomiting, diarrhea, constipation, bowel incontinence Gu:  Denies bladder incontinence, burning urine Ext:   No Joint pain, stiffness or swelling Skin: No skin rash, easy bruising or bleeding or hives Endoc:  No polyuria, polydipsia , polyphagia or weight change Other:  All other systems negative  Allergies:  Review of patient's allergies indicates no known allergies.  Physical Examination:  VS: BP 118/72 mmHg  Pulse 78  Ht 6' (1.829 m)  Wt 169 lb (76.658 kg)  BMI 22.92 kg/m2  SpO2 92%  General Appearance: No distress  HEENT: PERRLA, no ptosis, no other lesions noticed Pulmonary:normal breath sounds., diaphragmatic excursion normal.No wheezing, No rales , mild dec in BS at the bases.  Cardiovascular:  Normal S1,S2.  No m/r/g.     Abdomen:Exam: Benign, Soft, non-tender, No masses  Skin:   warm, no rashes, no ecchymosis  Extremities: normal, no cyanosis, clubbing, warm with normal capillary refill.     Biopsy result from  transbronch forcepts 01/07/15 DIAGNOSIS:  A. LUNG, RIGHT UPPER LOBE; ENB FORCEPS BIOPSY:  - NON-SMALL CELL CARCINOMA, FAVOR SQUAMOUS CELL CARCINOMA.   Note:  A cell block was included in the interpretation of this case. Specimen  reviewed in conjunction with [id number] with IHC results.  Assessment and Plan:81 yo with COPD, s\p bronch with biopsy for RUL mass, found to be NSCLC COPD (chronic obstructive pulmonary disease)  Patient with known COPD. Currently on Advair, Spiriva, albuterol nebulizer, albuterol inhaler. Review of patient's medications with him and explained maintenance versus rescue inhalers (medications ). At this time he has significant amount of emphysema as noted on his CAT scan , cont with Advair to 500/50 pulmonary  Plan: -Cont with Advair 500/50, 1 puff twice a day , gargle and rinse after each use. -continue with supplemental oxygen, 2 L continuously -Continue with Spiriva  -continue ambulation as tolerated  -Avoid any forms of secondhand smoke, or any type of tobacco      Hypoxia I believe his need for oxygen is multifactorial: atelectasis, severe emphysema , lung mass , and deconditioning.  At this time he is requiring O2 continuously, will plan for repeat CT chest - ? Pneumonitis since radiation.   Plan: continue with supplemental oxygen to maintain saturations greater than 88% continuously Prednisone '20mg'$  x 5 days.  Repeat CT chest with contrast in the next 1-2 weeks.        Chronic obstructive pulmonary emphysema  Patient with known COPD. Currently on Advair, Spiriva, albuterol nebulizer, albuterol inhaler. Review of patient's medications with him and explained maintenance versus rescue inhalers (medications ). At this time he has significant amount of emphysema as noted on his CAT scan , cont with Advair to 500/50 pulmonary  Plan: -Cont with Advair 500/50, 1 puff twice a day , gargle and rinse after each use. -continue with supplemental  oxygen, 2 L with exertion -Continue with Spiriva  -continue ambulation as tolerated  -Avoid any forms of secondhand smoke, or any type of tobacco - prednisone '20mg'$  x 5 days     Non-small cell cancer of right lung Currently being followed by Heme/Onc Has completed SBRT  Plan - repeat CT chest in 1-2 weeks.     Updated Medication List Outpatient Encounter Prescriptions as of 05/10/2015  Medication Sig  . ADVAIR DISKUS 500-50 MCG/DOSE AEPB INHALE ONE DOSE BY MOUTH TWICE DAILY. RINSE AND GARGLE AFTER EACH USE.  [name] albuterol (PROVENTIL HFA;VENTOLIN HFA) 108 (90 BASE) MCG/ACT inhaler Inhale 2 puffs into the lungs every 6 (six) hours as needed for wheezing or shortness of breath.   [name] alendronate (FOSAMAX) 70 MG tablet Take 70 mg by mouth once a week. Take on Sunday. Take with a full glass of water on an empty stomach.  [name] amLODipine (NORVASC) 5 MG tablet TAKE ONE TABLET BY MOUTH ONCE DAILY  . aspirin EC 81 MG tablet Take 81 mg by mouth as needed.   . Calcium Carbonate-Vitamin D (CALCIUM + D PO) Take 600 mg by mouth 2 (two) times daily.  . cholecalciferol (VITAMIN D) 1000 UNITS tablet Take 2,000 Units by mouth every morning.  [name] ipratropium-albuterol (DUONEB) 0.5-2.5 (3) MG/3ML SOLN Take 3 mLs by nebulization 4 (four) times daily.  [name] Leuprolide Acetate (LUPRON IJ) Inject 1 each as directed every 3 (three) months.   . metFORMIN (GLUCOPHAGE) 500 MG tablet TAKE ONE TABLET BY MOUTH ONCE DAILY  . metoprolol (LOPRESSOR) 50 MG tablet Take 1 tablet (50 mg total) by mouth 2 (two) times daily.  . Omega-3 Fatty Acids (FISH OIL) 1000 MG CAPS Take 2,000 mg by mouth every morning.  . pravastatin (PRAVACHOL) 40 MG tablet Take 40 mg by mouth every morning.   . tiotropium (SPIRIVA) 18 MCG inhalation capsule Place 18 mcg into inhaler and inhale every  morning.    No facility-administered encounter medications on file as of 05/10/2015.    Orders for this visit: Orders Placed This Encounter  Procedures  .  CT Chest W Contrast    Standing Status: Future     Number of Occurrences:      Standing Expiration Date: 07/09/2016    Order Specific Question:  If indicated for the ordered procedure, I authorize the administration of contrast media per Radiology protocol    Answer:  Yes    Order Specific Question:  Reason for Exam (SYMPTOM  OR DIAGNOSIS REQUIRED)    Answer:  lung mass    Order Specific Question:  Preferred imaging location?    Answer:  [facility]  . Basic Metabolic Panel (BMET)  . AMB REFERRAL FOR DME    Referral Priority:  Routine    Referral Type:  Durable Medical Equipment Purchase    Number of Visits Requested:  1    Thank  you for the visitation and for allowing  [facility] to assist in the care of your patient. Our recommendations are noted above.  Please contact us if we can be of further service.  Vilinda Boehringer, MD [facility] Office Number: [phone]

## 2015-05-10 NOTE — Assessment & Plan Note (Signed)
Patient with known COPD. Currently on Advair, Spiriva, albuterol nebulizer, albuterol inhaler. Review of patient's medications with him and explained maintenance versus rescue inhalers (medications ). At this time he has significant amount of emphysema as noted on his CAT scan , cont with Advair to 500/50 pulmonary  Plan: -Cont with Advair 500/50, 1 puff twice a day , gargle and rinse after each use. -continue with supplemental oxygen, 2 L with exertion -Continue with Spiriva  -continue ambulation as tolerated  -Avoid any forms of secondhand smoke, or any type of tobacco - prednisone '20mg'$  x 5 days

## 2015-05-10 NOTE — Patient Instructions (Addendum)
Follow up with Dr. Stevenson Clinch in: 3 months - CT chest with contrast in the next 1-2 weeks - follow up with Dr. Jeb Levering on Nov 28 - cont with your current inhalers.  - Prednisone '20mg'$  x 5 days - 1 tab with breakfast.

## 2015-05-12 ENCOUNTER — Ambulatory Visit
Admission: RE | Admit: 2015-05-12 | Discharge: 2015-05-12 | Disposition: A | Discharge status: Home / Self Care | Payer: Commercial Managed Care - HMO | Source: Ambulatory Visit | Attending: Internal Medicine | Admitting: Internal Medicine

## 2015-05-12 DIAGNOSIS — C3491 Malignant neoplasm of unspecified part of right bronchus or lung: Secondary | ICD-10-CM | POA: Diagnosis present

## 2015-05-12 DIAGNOSIS — R0902 Hypoxemia: Secondary | ICD-10-CM | POA: Diagnosis present

## 2015-05-12 DIAGNOSIS — J432 Centrilobular emphysema: Secondary | ICD-10-CM | POA: Diagnosis present

## 2015-05-12 MED ORDER — IOHEXOL 300 MG/ML  SOLN
75.0000 mL | Freq: Once | INTRAMUSCULAR | Status: AC | PRN
  Administered 2015-05-12: 75 mL via INTRAVENOUS

## 2015-05-13 ENCOUNTER — Encounter: Payer: Self-pay | Admitting: Family Medicine

## 2015-05-13 ENCOUNTER — Ambulatory Visit (INDEPENDENT_AMBULATORY_CARE_PROVIDER_SITE_OTHER): Payer: Commercial Managed Care - HMO | Admitting: Family Medicine

## 2015-05-13 ENCOUNTER — Telehealth: Payer: Self-pay | Admitting: *Deleted

## 2015-05-13 VITALS — BP 122/70 | HR 84 | Temp 97.7°F | Resp 16 | Wt 169.6 lb

## 2015-05-13 DIAGNOSIS — E1142 Type 2 diabetes mellitus with diabetic polyneuropathy: Secondary | ICD-10-CM | POA: Diagnosis not present

## 2015-05-13 DIAGNOSIS — C61 Malignant neoplasm of prostate: Secondary | ICD-10-CM

## 2015-05-13 DIAGNOSIS — C3491 Malignant neoplasm of unspecified part of right bronchus or lung: Secondary | ICD-10-CM

## 2015-05-13 LAB — POCT GLYCOSYLATED HEMOGLOBIN (HGB A1C): HEMOGLOBIN A1C: 6.4

## 2015-05-13 NOTE — Telephone Encounter (Signed)
-----   Message from Vilinda Boehringer, MD sent at 05/12/2015 11:57 PM EST ----- Regarding: CT results Please inform patient his CT results: 1. The mass on the right is shrinking, but there some mild findings consistent with recent radiation that could be causing is shortness of breath.  Find out if since we started him on steroids (for only 5 days) if he is doing any better.  Thanks -VM

## 2015-05-13 NOTE — Progress Notes (Signed)
Patient ID: Alan Weaver, male   DOB: Aug 08, 1926, 81 y.o.   MRN: [id number]    Subjective:  Diabetes He presents for his follow-up diabetic visit. He has type 2 diabetes mellitus. His disease course has been stable. Pertinent negatives for diabetes include no polydipsia, no polyuria, no visual change and no weight loss. Symptoms are stable. His weight is stable. He is following a diabetic and low fat/cholesterol diet. His breakfast blood glucose is taken between 9-10 am. His breakfast blood glucose range is generally 110-130 mg/dl. (After coffee and biscuit.)   Past Surgical History  Procedure Laterality Date  . Colonoscopy  03/12/13  . Prostate surgery  1994  . Hernia repair  2011  . Surgery for staph infection  1995  . Colonoscopy  2011  . Bone density test      Spine T-spine = -1.7  . Electromagnetic navigation brochoscopy N/A 01/07/2015    Procedure: ELECTROMAGNETIC NAVIGATION BRONCHOSCOPY;  Surgeon: Vilinda Boehringer, MD;  Location: [facility] ORS;  Service: Cardiopulmonary;  Laterality: N/A;    No Known Allergies  Prior to Admission medications   Medication Sig Start Date End Date Taking? Authorizing Provider  ADVAIR DISKUS 500-50 MCG/DOSE AEPB INHALE ONE DOSE BY MOUTH TWICE DAILY. RINSE AND GARGLE AFTER EACH USE. 03/22/15  Yes Vishal Mungal, MD  albuterol (PROVENTIL HFA;VENTOLIN HFA) 108 (90 BASE) MCG/ACT inhaler Inhale 2 puffs into the lungs every 6 (six) hours as needed for wheezing or shortness of breath.    Yes Historical Provider, MD  alendronate (FOSAMAX) 70 MG tablet Take 70 mg by mouth once a week. Take on Sunday. Take with a full glass of water on an empty stomach.   Yes Historical Provider, MD  amLODipine (NORVASC) 5 MG tablet TAKE ONE TABLET BY MOUTH ONCE DAILY 04/23/15  Yes Vickki Muff Chrismon, PA  aspirin EC 81 MG tablet Take 81 mg by mouth as needed.    Yes Historical Provider, MD  Calcium Carbonate-Vitamin D (CALCIUM + D PO) Take 600 mg by mouth 2 (two) times daily.   Yes  Historical Provider, MD  cholecalciferol (VITAMIN D) 1000 UNITS tablet Take 2,000 Units by mouth every morning.   Yes Historical Provider, MD  ipratropium-albuterol (DUONEB) 0.5-2.5 (3) MG/3ML SOLN Take 3 mLs by nebulization 4 (four) times daily. 03/16/15  Yes Dennis E Chrismon, PA  Leuprolide Acetate (LUPRON IJ) Inject 1 each as directed every 3 (three) months.    Yes Historical Provider, MD  metFORMIN (GLUCOPHAGE) 500 MG tablet TAKE ONE TABLET BY MOUTH ONCE DAILY 12/22/14  Yes Vickki Muff Chrismon, PA  metoprolol (LOPRESSOR) 50 MG tablet Take 1 tablet (50 mg total) by mouth 2 (two) times daily. 11/25/14  Yes Gladstone Lighter, MD  Omega-3 Fatty Acids (FISH OIL) 1000 MG CAPS Take 2,000 mg by mouth every morning.   Yes Historical Provider, MD  pravastatin (PRAVACHOL) 40 MG tablet Take 40 mg by mouth every morning.    Yes Historical Provider, MD  predniSONE (DELTASONE) 20 MG tablet Take 1 tablet (20 mg total) by mouth daily with breakfast. 05/10/15 05/09/16 Yes Vishal Mungal, MD  tiotropium (SPIRIVA) 18 MCG inhalation capsule Place 18 mcg into inhaler and inhale every morning.    Yes Historical Provider, MD    Patient Active Problem List   Diagnosis Date Noted  . Non-small cell cancer of right lung ([place]) 02/09/2015  . Dyspnea 12/24/2014  . COPD (chronic obstructive pulmonary disease) ([place]) 12/24/2014  . Hypoxia 12/24/2014  . Allergic rhinitis 12/08/2014  . Diverticulosis  12/08/2014  . DM type 2 with diabetic peripheral neuropathy ([place]) 12/08/2014  . Mixed hyperlipidemia 12/08/2014  . Osteoporosis 12/08/2014  . Prostate cancer ([place]) 12/08/2014  . Pruritic dermatitis 12/08/2014  . Chronic obstructive pulmonary emphysema ([place]) 12/08/2014  . Lung mass 12/08/2014  . Sepsis ([place]) 11/24/2014  . Pneumonia 11/24/2014  . Acute respiratory failure ([employer]) 11/24/2014  . COPD exacerbation ([place]) 11/22/2014  . Personal history of colonic polyps 01/08/2013    Past Medical History  Diagnosis Date  . Heart  murmur   . COPD (chronic obstructive pulmonary disease) ([place])   . High blood cholesterol level   . Irritable bowel syndrome   . Diabetes mellitus without complication ([place])   . Osteoporosis   . Incontinence   . COPD (chronic obstructive pulmonary disease) ([place]) 2000  . Atrial fibrillation ([place])   . Cancer [facility]) 1994    prostate  . Pneumonia   . Hypertension   . Chronic kidney disease     KIDNEY STONES  . GERD (gastroesophageal reflux disease)   . On home oxygen therapy     2L [place] PRN    Social History   Social History  . Marital Status: Widowed    Spouse Name: N/A  . Number of Children: N/A  . Years of Education: N/A   Occupational History  . Retired     Environmental consultant   Social History Main Topics  . Smoking status: Former Smoker -- 1.00 packs/day    Types: Cigarettes    Quit date: 05/15/2006  . Smokeless tobacco: Never Used  . Alcohol Use: No  . Drug Use: No  . Sexual Activity: Not on file   Other Topics Concern  . Not on file   Social History Narrative   Lives at home by himself. No cane or walker at baseline.   Ambulation limited by dyspnea.    No Known Allergies  Review of Systems  Constitutional: Negative.  Negative for weight loss.  HENT: Negative.   Eyes: Negative.   Respiratory: Negative.   Cardiovascular: Negative.   Gastrointestinal: Negative.   Genitourinary: Negative.   Musculoskeletal: Negative.   Skin: Negative.   Neurological: Negative.   Endo/Heme/Allergies: Negative.  Negative for polydipsia.  Psychiatric/Behavioral: Negative.     Immunization History  Administered Date(s) Administered  . Influenza-Unspecified 04/05/2015  . Pneumococcal Conjugate-13 06/30/2014  . Td 06/30/2014   Objective:  BP 122/70 mmHg  Pulse 84  Temp(Src) 97.7 F (36.5 C) (Oral)  Resp 16  Wt 169 lb 9.6 oz (76.93 kg)  SpO2 90% Wt Readings from Last 3 Encounters:  05/13/15 169 lb 9.6 oz (76.93 kg)  05/10/15 169 lb (76.658 kg)  03/31/15 170 lb  8.4 oz (77.35 kg)    Physical Exam  Constitutional: He is oriented to person, place, and time and well-developed, well-nourished, and in no distress.  HENT:  Head: Normocephalic.  Eyes: Conjunctivae and EOM are normal.  Neck: Neck supple.  Cardiovascular: Normal rate and regular rhythm.   Pulmonary/Chest:  Distant breath sounds. On 2 LPM oxygen 24 hours a day as needed. Don't use it every night.  Abdominal: Soft. Bowel sounds are normal.  Neurological: He is alert and oriented to person, place, and time.  Decreased sensation plantar surface of first MTP joint each foot.  Psychiatric: Affect and judgment normal.    Lab Results  Component Value Date   WBC 7.2 01/11/2015   HGB 14.2 01/11/2015   HCT 43.9 01/11/2015   PLT 285 01/11/2015  GLUCOSE 120* 05/10/2015   PSA 0.15 01/11/2015   HGBA1C 6.6 02/09/2015    CMP     Component Value Date/Time   NA 143 05/10/2015 1705   NA 140 06/07/2013 2123   K 5.2* 05/10/2015 1705   K 4.2 06/07/2013 2123   CL 102 05/10/2015 1705   CL 103 06/07/2013 2123   CO2 34* 05/10/2015 1705   CO2 30 06/07/2013 2123   GLUCOSE 120* 05/10/2015 1705   GLUCOSE 221* 06/07/2013 2123   BUN 19 05/10/2015 1705   BUN 27* 06/07/2013 2123   CREATININE 0.91 05/10/2015 1705   CREATININE 1.21 06/07/2013 2123   CALCIUM 9.6 05/10/2015 1705   CALCIUM 9.2 06/07/2013 2123   PROT 7.0 01/11/2015 1105   PROT 7.9 07/01/2012 0851   ALBUMIN 3.8 01/11/2015 1105   ALBUMIN 3.5 07/01/2012 0851   AST 16 01/11/2015 1105   AST 19 07/01/2012 0851   ALT 13* 01/11/2015 1105   ALT 20 07/01/2012 0851   ALKPHOS 43 01/11/2015 1105   ALKPHOS 68 07/01/2012 0851   BILITOT 0.7 01/11/2015 1105   BILITOT 0.5 07/01/2012 0851   GFRNONAA >60 05/10/2015 1705   GFRNONAA 55* 06/07/2013 2123   GFRAA >60 05/10/2015 1705   GFRAA >60 06/07/2013 2123    Assessment and Plan :  1. DM type 2 with diabetic peripheral neuropathy (HCC) Tolerating Metformin 500 mg qd and trying to follow low  fat diabetic diet. Hgb A1C is 6.4% today indicating good control. Numbness in both first MTP areas of both feet. No ulcerations. Recheck in 3 months. - POCT HgB A1C  2. Non-small cell cancer of right lung [facility]) Hospitalized 11-22-14 for acute respiratory failure due to acute exacerbation of COPD and a right hilar mass was found on CXR. Bronchoscopy in July 2016 confirmed non-small cell lung cancer per Dr. Stevenson Clinch (pulmonologist) and followed by oncologist (Dr. Oliva Bustard). Presently on 2 LPM oxygen by nasal cannula 24 hours a day. Pulse oximetry 90% today while on oxygen.  3. Prostate cancer ([place]) Diagnosed in 1994 and presently on Lupron injections every 3-4 months by Dr. Oliva Bustard.    [facility] 05/13/2015 9:44 AM

## 2015-05-13 NOTE — Telephone Encounter (Signed)
Pt informed of results. States he has only taken 2 days worth of Prednisone. He will call me on Monday to let me know how he is doing. States on 2 days worth he feels not differences.

## 2015-05-17 ENCOUNTER — Telehealth: Payer: Self-pay | Admitting: Internal Medicine

## 2015-05-17 MED ORDER — PREDNISONE 20 MG PO TABS: 20.0000 mg | ORAL_TABLET | Freq: Every day | ORAL | Status: DC

## 2015-05-17 NOTE — Telephone Encounter (Signed)
Pt states he finished the 5 days of prednisone. Says he feels slightly better. Is asking if he needs to do the Prednisone again or what is your suggestion. Thanks

## 2015-05-17 NOTE — Telephone Encounter (Signed)
rx sent and pt informed. appt scheduled. Nothing further needed.

## 2015-05-17 NOTE — Telephone Encounter (Signed)
LM on VM for pt to return call.

## 2015-05-17 NOTE — Telephone Encounter (Signed)
He possibly has radiation pneumonitis. Will plan to treat with '20mg'$  daily x 30day. Please inform him to follow up closely with his PMD as his glucose levels might need to be monitored while on this dose of steroid.  After 30 days we will have to wean the steroids, place a 1 month follow up with next available provider for steroid weaning of radiation pneumonitis. Then he can follow up with me afterwards.   Thanks VM

## 2015-05-24 ENCOUNTER — Encounter: Payer: Self-pay | Admitting: Oncology

## 2015-05-24 ENCOUNTER — Inpatient Hospital Stay: Payer: Commercial Managed Care - HMO

## 2015-05-24 ENCOUNTER — Inpatient Hospital Stay: Payer: Commercial Managed Care - HMO | Attending: Oncology | Admitting: Oncology

## 2015-05-24 VITALS — BP 117/67 | HR 70 | Temp 96.6°F | Wt 168.0 lb

## 2015-05-24 DIAGNOSIS — C61 Malignant neoplasm of prostate: Secondary | ICD-10-CM | POA: Insufficient documentation

## 2015-05-24 DIAGNOSIS — J449 Chronic obstructive pulmonary disease, unspecified: Secondary | ICD-10-CM | POA: Diagnosis not present

## 2015-05-24 DIAGNOSIS — Z79818 Long term (current) use of other agents affecting estrogen receptors and estrogen levels: Secondary | ICD-10-CM | POA: Diagnosis not present

## 2015-05-24 DIAGNOSIS — Z79899 Other long term (current) drug therapy: Secondary | ICD-10-CM | POA: Insufficient documentation

## 2015-05-24 DIAGNOSIS — Z9981 Dependence on supplemental oxygen: Secondary | ICD-10-CM | POA: Insufficient documentation

## 2015-05-24 DIAGNOSIS — Z7982 Long term (current) use of aspirin: Secondary | ICD-10-CM | POA: Insufficient documentation

## 2015-05-24 DIAGNOSIS — Z8052 Family history of malignant neoplasm of bladder: Secondary | ICD-10-CM | POA: Diagnosis not present

## 2015-05-24 DIAGNOSIS — Z8701 Personal history of pneumonia (recurrent): Secondary | ICD-10-CM | POA: Diagnosis not present

## 2015-05-24 DIAGNOSIS — J189 Pneumonia, unspecified organism: Secondary | ICD-10-CM | POA: Diagnosis not present

## 2015-05-24 DIAGNOSIS — E7800 Pure hypercholesterolemia, unspecified: Secondary | ICD-10-CM | POA: Diagnosis not present

## 2015-05-24 DIAGNOSIS — R0602 Shortness of breath: Secondary | ICD-10-CM | POA: Diagnosis not present

## 2015-05-24 DIAGNOSIS — Z923 Personal history of irradiation: Secondary | ICD-10-CM | POA: Diagnosis not present

## 2015-05-24 DIAGNOSIS — I129 Hypertensive chronic kidney disease with stage 1 through stage 4 chronic kidney disease, or unspecified chronic kidney disease: Secondary | ICD-10-CM | POA: Diagnosis not present

## 2015-05-24 DIAGNOSIS — E785 Hyperlipidemia, unspecified: Secondary | ICD-10-CM | POA: Diagnosis not present

## 2015-05-24 DIAGNOSIS — N189 Chronic kidney disease, unspecified: Secondary | ICD-10-CM | POA: Diagnosis not present

## 2015-05-24 DIAGNOSIS — Z7952 Long term (current) use of systemic steroids: Secondary | ICD-10-CM | POA: Insufficient documentation

## 2015-05-24 DIAGNOSIS — Z87891 Personal history of nicotine dependence: Secondary | ICD-10-CM | POA: Insufficient documentation

## 2015-05-24 DIAGNOSIS — K589 Irritable bowel syndrome without diarrhea: Secondary | ICD-10-CM | POA: Diagnosis not present

## 2015-05-24 DIAGNOSIS — L723 Sebaceous cyst: Secondary | ICD-10-CM | POA: Diagnosis not present

## 2015-05-24 DIAGNOSIS — E119 Type 2 diabetes mellitus without complications: Secondary | ICD-10-CM | POA: Diagnosis not present

## 2015-05-24 DIAGNOSIS — I700 Atherosclerosis of aorta: Secondary | ICD-10-CM | POA: Insufficient documentation

## 2015-05-24 DIAGNOSIS — Z87442 Personal history of urinary calculi: Secondary | ICD-10-CM | POA: Diagnosis not present

## 2015-05-24 DIAGNOSIS — K225 Diverticulum of esophagus, acquired: Secondary | ICD-10-CM | POA: Insufficient documentation

## 2015-05-24 DIAGNOSIS — Z7984 Long term (current) use of oral hypoglycemic drugs: Secondary | ICD-10-CM | POA: Diagnosis not present

## 2015-05-24 DIAGNOSIS — Z800 Family history of malignant neoplasm of digestive organs: Secondary | ICD-10-CM | POA: Insufficient documentation

## 2015-05-24 DIAGNOSIS — M818 Other osteoporosis without current pathological fracture: Secondary | ICD-10-CM | POA: Diagnosis not present

## 2015-05-24 DIAGNOSIS — I4891 Unspecified atrial fibrillation: Secondary | ICD-10-CM | POA: Diagnosis not present

## 2015-05-24 DIAGNOSIS — C3411 Malignant neoplasm of upper lobe, right bronchus or lung: Secondary | ICD-10-CM | POA: Insufficient documentation

## 2015-05-24 DIAGNOSIS — K219 Gastro-esophageal reflux disease without esophagitis: Secondary | ICD-10-CM | POA: Insufficient documentation

## 2015-05-24 LAB — CBC WITH DIFFERENTIAL/PLATELET
Basophils Absolute: 0.1 10*3/uL (ref 0–0.1)
Basophils Relative: 1 %
Eosinophils Absolute: 0.1 10*3/uL (ref 0–0.7)
Eosinophils Relative: 2 %
HCT: 44.7 % (ref 40.0–52.0)
Hemoglobin: 14.6 g/dL (ref 13.0–18.0)
Lymphocytes Relative: 7 %
Lymphs Abs: 0.6 10*3/uL — ABNORMAL LOW (ref 1.0–3.6)
MCH: 28.4 pg (ref 26.0–34.0)
MCHC: 32.7 g/dL (ref 32.0–36.0)
MCV: 86.8 fL (ref 80.0–100.0)
Monocytes Absolute: 0.4 10*3/uL (ref 0.2–1.0)
Monocytes Relative: 5 %
Neutro Abs: 7.6 10*3/uL — ABNORMAL HIGH (ref 1.4–6.5)
Neutrophils Relative %: 85 %
Platelets: 258 10*3/uL (ref 150–440)
RBC: 5.15 MIL/uL (ref 4.40–5.90)
RDW: 15.9 % — ABNORMAL HIGH (ref 11.5–14.5)
WBC: 8.9 10*3/uL (ref 3.8–10.6)

## 2015-05-24 LAB — COMPREHENSIVE METABOLIC PANEL
ALT: 12 U/L — ABNORMAL LOW (ref 17–63)
AST: 15 U/L (ref 15–41)
Albumin: 3.5 g/dL (ref 3.5–5.0)
Alkaline Phosphatase: 43 U/L (ref 38–126)
Anion gap: 8 (ref 5–15)
BUN: 20 mg/dL (ref 6–20)
CO2: 32 mmol/L (ref 22–32)
Calcium: 8.8 mg/dL — ABNORMAL LOW (ref 8.9–10.3)
Chloride: 96 mmol/L — ABNORMAL LOW (ref 101–111)
Creatinine, Ser: 1.04 mg/dL (ref 0.61–1.24)
GFR calc Af Amer: >60 mL/min (ref >60)
GFR calc non Af Amer: >60 mL/min (ref >60)
Glucose, Bld: 144 mg/dL — ABNORMAL HIGH (ref 65–99)
Potassium: 4 mmol/L (ref 3.5–5.1)
Sodium: 136 mmol/L (ref 135–145)
Total Bilirubin: 0.6 mg/dL (ref 0.3–1.2)
Total Protein: 6.9 g/dL (ref 6.5–8.1)

## 2015-05-24 LAB — PSA: PSA: 0.24 ng/mL (ref 0.00–4.00)

## 2015-05-24 MED ORDER — LEUPROLIDE ACETATE (4 MONTH) 30 MG IM KIT
30.0000 mg | PACK | Freq: Once | INTRAMUSCULAR | Status: AC
  Administered 2015-05-24: 30 mg via INTRAMUSCULAR
  Filled 2015-05-24: qty 30

## 2015-05-24 NOTE — Progress Notes (Signed)
Alan Weaver @ [facility] [phone]  Fax:(336) [place]: 1926/10/21  MR#: [id number]  NOM#:[id number]  Patient Care Team: Birdie Sons, MD as PCP - General (Family Medicine) Robert Bellow, MD (General Surgery) Dallas Schimke, MD (Internal Medicine) Vilinda Boehringer, MD as Consulting Physician (Internal Medicine)  CHIEF COMPLAINT:  Chief Complaint  Patient presents with  . Lung Cancer    Oncology History   1.  Prostate cancer.  Original radical prostatectomy.  Later recurrence and radiation treatment.  Later a rising PSA.  Referred for Lupron beginning May 2002. Treatment 5/02 to 5/03. Drug holiday 5/03 to 3/04. Treatment 3/04 to 2/05. Drug holiday 2/05. Restarted 7/05, off again , last dose 01/2010, then on and off again, se earlier notes,  resumed 06/2012.  2.  Previous surgeries include umbilical hernia repair and lysing of adhesions, inguinal hernia repair then breakdown and recurrence.  3.  Hyperlipidemia.  4.  History of cardiac arrhythmia ventricular beats evaluated in the past by Dr. Clayborn Bigness.    5.  Osteoporosis. 6 right upper lobe lung nodule is positive for non-small cell carcinoma of lung stage I disease     Prostate cancer [facility])    Oncology Flowsheet 11/23/2014 11/23/2014 11/24/2014 11/24/2014 11/25/2014 01/07/2015 01/11/2015  dexamethasone (DECADRON) IJ - - - - - - -  leuprolide (LUPRON) IM - - - - - - 30 mg  methylPREDNISolone sodium succinate (SOLU-MEDROL) IV 125 mg 40 mg 40 mg 40 mg 40 mg - -  ondansetron (ZOFRAN) IV - - - - - 4 mg -    INTERVAL HISTORY:  81 year old gentleman who has previous history of carcinoma prostate which is castration sensitive prostate cancer.  Patient is on Lupron injection off-and-on.  Patient was admitted in the hospital with history of pneumonia a CT scan revealed right upper lobe lung mass. Prior to admission in the hospital patient was taking care of himself.  Now on oxygen at rest oxygen saturation  was 84 percent.  With 2 L of oxygen and improved to 97%.  Patient is using inhalers.  Was also seen by pulmonologist She was referred to me for further evaluation and treatment, consideration Patient is here for further follow-up regarding carcinoma of lung had a repeat CT scan.  Which has been reviewed independently and reviewed with the patient.  Also for prostate cancer  REVIEW OF SYSTEMS:   Gen. status: Patient is on oxygen.  Not in any acute distress. HEENT: No soreness in the mouth.  No cough. No hemoptysis or chest pain Cardiac: No chest pain or paroxysmal nocturnal dyspnea GI: No nausea no vomiting no diarrhea appetite has been stable. Lower extremity no edema. Musculoskeletal system no bony pain Skin: No rash Neurological system no headache no dizziness As per HPI. Otherwise, a complete review of systems is negative.  PAST MEDICAL HISTORY: Past Medical History  Diagnosis Date  . Heart murmur   . COPD (chronic obstructive pulmonary disease) ([place])   . High blood cholesterol level   . Irritable bowel syndrome   . Diabetes mellitus without complication ([place])   . Osteoporosis   . Incontinence   . COPD (chronic obstructive pulmonary disease) ([place]) 2000  . Atrial fibrillation ([place])   . Cancer [facility]) 1994    prostate  . Pneumonia   . Hypertension   . Chronic kidney disease     KIDNEY STONES  . GERD (gastroesophageal reflux disease)   . On home oxygen therapy  2L [place] PRN    PAST SURGICAL HISTORY: Past Surgical History  Procedure Laterality Date  . Colonoscopy  03/12/13  . Prostate surgery  1994  . Hernia repair  2011  . Surgery for staph infection  1995  . Colonoscopy  2011  . Bone density test      Spine T-spine = -1.7  . Electromagnetic navigation brochoscopy N/A 01/07/2015    Procedure: ELECTROMAGNETIC NAVIGATION BRONCHOSCOPY;  Surgeon: Vilinda Boehringer, MD;  Location: [facility] ORS;  Service: Cardiopulmonary;  Laterality: N/A;    FAMILY HISTORY Family History    Problem Relation Age of Onset  . Cancer Father     bladder  . Other Sister     brain tumor  . Cancer Brother     colon  . Asthma Mother   . Osteoporosis Mother     ADVANCED DIRECTIVES:  No flowsheet data found.  HEALTH MAINTENANCE: Social History  Substance Use Topics  . Smoking status: Former Smoker -- 1.00 packs/day    Types: Cigarettes    Quit date: 05/15/2006  . Smokeless tobacco: Never Used  . Alcohol Use: No      No Known Allergies  Current Outpatient Prescriptions  Medication Sig Dispense Refill  . ADVAIR DISKUS 500-50 MCG/DOSE AEPB INHALE ONE DOSE BY MOUTH TWICE DAILY. RINSE AND GARGLE AFTER EACH USE. 60 each 0  . albuterol (PROVENTIL HFA;VENTOLIN HFA) 108 (90 BASE) MCG/ACT inhaler Inhale 2 puffs into the lungs every 6 (six) hours as needed for wheezing or shortness of breath.     [name] alendronate (FOSAMAX) 70 MG tablet Take 70 mg by mouth once a week. Take on Sunday. Take with a full glass of water on an empty stomach.    [name] amLODipine (NORVASC) 5 MG tablet TAKE ONE TABLET BY MOUTH ONCE DAILY 30 tablet 3  . aspirin EC 81 MG tablet Take 81 mg by mouth as needed.     . Calcium Carbonate-Vitamin D (CALCIUM + D PO) Take 600 mg by mouth 2 (two) times daily.    . cholecalciferol (VITAMIN D) 1000 UNITS tablet Take 2,000 Units by mouth every morning.    [name] ipratropium-albuterol (DUONEB) 0.5-2.5 (3) MG/3ML SOLN Take 3 mLs by nebulization 4 (four) times daily. 360 mL 1  . Leuprolide Acetate (LUPRON IJ) Inject 1 each as directed every 3 (three) months.     . metFORMIN (GLUCOPHAGE) 500 MG tablet TAKE ONE TABLET BY MOUTH ONCE DAILY 30 tablet 6  . metoprolol (LOPRESSOR) 50 MG tablet Take 1 tablet (50 mg total) by mouth 2 (two) times daily. 60 tablet 1  . Omega-3 Fatty Acids (FISH OIL) 1000 MG CAPS Take 2,000 mg by mouth every morning.    . pravastatin (PRAVACHOL) 40 MG tablet Take 40 mg by mouth every morning.     . predniSONE (DELTASONE) 20 MG tablet Take 1 tablet (20 mg total) by  mouth daily with breakfast. 5 tablet 0  . predniSONE (DELTASONE) 20 MG tablet Take 1 tablet (20 mg total) by mouth daily with breakfast. 30 tablet 0  . tiotropium (SPIRIVA) 18 MCG inhalation capsule Place 18 mcg into inhaler and inhale every morning.      No current facility-administered medications for this visit.    OBJECTIVE:  Filed Vitals:   05/24/15 1056  BP: 117/67  Pulse: 70  Temp: 96.6 F (35.9 C)     Body mass index is 22.78 kg/(m^2).    ECOG FS:1 - Symptomatic but completely ambulatory  PHYSICAL EXAM: Gen. status: Alert  and oriented individual not any acute distress Lungs: Emphysematous chest diminished air entry on both sides Cardiac: Days no irregular heart sounds soft systolic murmur. Abdominal exam revealed normal bowel sounds. The abdomen was soft, non-tender, and without masses, organomegaly, or appreciable enlargement of the abdominal aorta. Lymphatic system: Supraclavicular, cervical, axillary, inguinal lymph nodes are not palpable Examination of the skin revealed no evidence of significant rashes, suspicious appearing nevi or other concerning lesions. Neurologically, the patient was awake, alert, and oriented to person, place and time. There were no obvious focal neurologic abnormalities. Lower extremity no edema    LAB RESULTS:  Lab Results  Component Value Date   PSA 0.15 01/11/2015     STUDIES: Ct Chest W Contrast  05/12/2015  CLINICAL DATA:  Non-small-cell right lung cancer post radiation therapy. History of COPD/ emphysema and prostate cancer. Subsequent encounter. EXAM: CT CHEST WITH CONTRAST TECHNIQUE: Multidetector CT imaging of the chest was performed during intravenous contrast administration. CONTRAST:  78m OMNIPAQUE IOHEXOL 300 MG/ML  SOLN COMPARISON:  CT 12/14/2014.  PET-CT 01/06/2015. FINDINGS: Mediastinum/Nodes: There are no enlarged mediastinal, hilar or axillary lymph nodes.Previously noted prominent right hilar node has improved, measuring  6 mm on image 36 (previously 10 mm). 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 The thyroid gland and trachea demonstrate no significant findings. The heart size is normal. There is no pericardial effusion. There is extensive atherosclerosis of the aorta, great vessels and coronary arteries. Lungs/Pleura: There is no pleural effusion. Again demonstrated is severe emphysema. The spiculated right upper lobe mass just superior to the minor fissure has decreased in size, now measuring 2.8 x 1.4 x 1.1 cm (previously 4.0 x 3.3 x 2.4 cm). There is some surrounding parenchymal opacity tracking posteriorly along the major fissure, probably atelectasis or inflammation. The 2.6 x 2.0 cm density within the lingula on image 49 remains elongated on the reformatted images and appears unchanged. This was not significantly hypermetabolic on PET-CT and is probably chronic atelectasis. No new or enlarging pulmonary nodules demonstrated. Upper abdomen: The visualized upper abdomen appears stable without suspicious findings. Musculoskeletal/Chest wall: There is no chest wall mass or suspicious osseous finding. There are stable mild degenerative changes throughout the spine. There is a stable probable sebaceous cyst in the mid right back. IMPRESSION: 1. Interval decreased size of primary spiculated mass in the right upper lobe following radiation therapy. Adjacent parenchymal opacities are probably related to radiation and reflect atelectasis or inflammation. 2. The lingular density is unchanged, not hypermetabolic on PET-CT and probably atelectasis. 3. No evidence of metastatic disease. 4. Stable incidental findings including severe emphysema, diffuse atherosclerosis and a large esophageal epiphrenic diverticulum. Electronically Signed   By: WRichardean SaleM.D.   On: 05/12/2015 17:08   Lab Results  Component  Value Date   PSA 0.15 01/11/2015    ASSESSMENT: Right upper lobe lung mass.  There is a spiculated lung mass approximately 3.3 cm suggestive of primary bronchogenic carcinoma. Carcinoma prostate on Lupron therapy last PSA 0.1 in April of 2016 PSA is 0.15 MEDICAL DECISION MAKING:  It upper lobe lung nodule is positive for non-small cell carcinoma of lung. Discussed pathology report today with pathologist.  And I discussed situation with Dr. CDonella Staderadiation oncologist. Our most appropriate treatment would be radiation therapy with stereotactic radiation treatment.  Patient does not need any chemotherapy at present time but will be carefully followed. CT  scan of the chest has been reviewed independently and reviewed with the patient.  There is definite decrease in the size of the right upper lobe nodule but there is lot of other changes suggestive of radiation pneumonitis Possibility of continuing steroid under pulmonologists guidance  Continue Lupron for carcinoma prostate recurrent disease  Reevaluate PSA  PSA for month ago was 0.15 Duration of visit is 25 minutes and 50% of time was spent discussing VARIOUS  options or alleviating care with other physicians social worker Reviewing CT scan.  Reviewing records from the pulmonologist   No matching staging information was found for the patient.  Forest Gleason, MD   05/24/2015 11:09 AM

## 2015-05-24 NOTE — Progress Notes (Signed)
[place] @ [facility] [phone]  Fax:([phone]) [place]: [birth year]  MR#: [id number]  GEZ#:[id number]  Patient Care Team: Birdie Sons, MD as PCP - General (Family Medicine) Robert Bellow, MD (General Surgery) Dallas Schimke, MD (Internal Medicine) Vilinda Boehringer, MD as Consulting Physician (Internal Medicine)  CHIEF COMPLAINT:  No chief complaint on file.   Oncology History   1.  Prostate cancer.  Original radical prostatectomy.  Later recurrence and radiation treatment.  Later a rising PSA.  Referred for Lupron beginning May 2002. Treatment 5/02 to 5/03. Drug holiday 5/03 to 3/04. Treatment 3/04 to 2/05. Drug holiday 2/05. Restarted 7/05, off again , last dose 01/2010, then on and off again, se earlier notes,  resumed 06/2012.  2.  Previous surgeries include umbilical hernia repair and lysing of adhesions, inguinal hernia repair then breakdown and recurrence.  3.  Hyperlipidemia.  4.  History of cardiac arrhythmia ventricular beats evaluated in the past by Dr. Clayborn Bigness.    5.  Osteoporosis. 6 right upper lobe lung nodule is positive for non-small cell carcinoma of lung stage I disease     Prostate cancer [facility])    Oncology Flowsheet 11/23/2014 11/23/2014 11/24/2014 11/24/2014 11/25/2014 01/07/2015 01/11/2015  dexamethasone (DECADRON) IJ - - - - - - -  leuprolide (LUPRON) IM - - - - - - 30 mg  methylPREDNISolone sodium succinate (SOLU-MEDROL) IV 125 mg 40 mg 40 mg 40 mg 40 mg - -  ondansetron (ZOFRAN) IV - - - - - 4 mg -    INTERVAL HISTORY:  81 year old gentleman who has previous history of carcinoma prostate which is castration sensitive prostate cancer.  Patient is on Lupron injection off-and-on.  Patient was admitted in the hospital with history of pneumonia a CT scan revealed right upper lobe lung mass. Prior to admission in the hospital patient was taking care of himself.  Now on oxygen at rest oxygen saturation was 84 percent.  With 2 L of  oxygen and improved to 97%.  Patient is using inhalers.  Was also seen by pulmonologist She was referred to me for further evaluation and treatment, consideration Patient is here for further follow-up regarding carcinoma of lung had a repeat CT scan.  Which has been reviewed independently and reviewed with the patient.  Also for prostate cancer  REVIEW OF SYSTEMS:   Gen. status: Patient is on oxygen.  Not in any acute distress. HEENT: No soreness in the mouth.  No cough.  Patient complains of some increasing shortness of breath.  On oxygen 2 L by nasal cannula.  Was recently seen by pulmonologist and was started on prednisone last week No hemoptysis or chest pain Cardiac: No chest pain or paroxysmal nocturnal dyspnea GI: No nausea no vomiting no diarrhea appetite has been stable. Lower extremity no edema. Musculoskeletal system no bony pain Skin: No rash Neurological system no headache no dizziness As per HPI. Otherwise, a complete review of systems is negative.  PAST MEDICAL HISTORY: Past Medical History  Diagnosis Date  . Heart murmur   . COPD (chronic obstructive pulmonary disease) ([place])   . High blood cholesterol level   . Irritable bowel syndrome   . Diabetes mellitus without complication ([place])   . Osteoporosis   . Incontinence   . COPD (chronic obstructive pulmonary disease) ([place]) 2000  . Atrial fibrillation ([place])   . Cancer [facility]) 1994    prostate  . Pneumonia   . Hypertension   .  Chronic kidney disease     KIDNEY STONES  . GERD (gastroesophageal reflux disease)   . On home oxygen therapy     2L [place] PRN    PAST SURGICAL HISTORY: Past Surgical History  Procedure Laterality Date  . Colonoscopy  03/12/13  . Prostate surgery  1994  . Hernia repair  2011  . Surgery for staph infection  1995  . Colonoscopy  2011  . Bone density test      Spine T-spine = -1.7  . Electromagnetic navigation brochoscopy N/A 01/07/2015    Procedure: ELECTROMAGNETIC NAVIGATION  BRONCHOSCOPY;  Surgeon: Vilinda Boehringer, MD;  Location: [facility] ORS;  Service: Cardiopulmonary;  Laterality: N/A;    FAMILY HISTORY Family History  Problem Relation Age of Onset  . Cancer Father     bladder  . Other Sister     brain tumor  . Cancer Brother     colon  . Asthma Mother   . Osteoporosis Mother     ADVANCED DIRECTIVES:  No flowsheet data found.  HEALTH MAINTENANCE: Social History  Substance Use Topics  . Smoking status: Former Smoker -- 1.00 packs/day    Types: Cigarettes    Quit date: 05/15/2006  . Smokeless tobacco: Never Used  . Alcohol Use: No      No Known Allergies  Current Outpatient Prescriptions  Medication Sig Dispense Refill  . ADVAIR DISKUS 500-50 MCG/DOSE AEPB INHALE ONE DOSE BY MOUTH TWICE DAILY. RINSE AND GARGLE AFTER EACH USE. 60 each 0  . albuterol (PROVENTIL HFA;VENTOLIN HFA) 108 (90 BASE) MCG/ACT inhaler Inhale 2 puffs into the lungs every 6 (six) hours as needed for wheezing or shortness of breath.     [name] alendronate (FOSAMAX) 70 MG tablet Take 70 mg by mouth once a week. Take on Sunday. Take with a full glass of water on an empty stomach.    [name] amLODipine (NORVASC) 5 MG tablet TAKE ONE TABLET BY MOUTH ONCE DAILY 30 tablet 3  . aspirin EC 81 MG tablet Take 81 mg by mouth as needed.     . Calcium Carbonate-Vitamin D (CALCIUM + D PO) Take 600 mg by mouth 2 (two) times daily.    . cholecalciferol (VITAMIN D) 1000 UNITS tablet Take 2,000 Units by mouth every morning.    [name] ipratropium-albuterol (DUONEB) 0.5-2.5 (3) MG/3ML SOLN Take 3 mLs by nebulization 4 (four) times daily. 360 mL 1  . Leuprolide Acetate (LUPRON IJ) Inject 1 each as directed every 3 (three) months.     . metFORMIN (GLUCOPHAGE) 500 MG tablet TAKE ONE TABLET BY MOUTH ONCE DAILY 30 tablet 6  . metoprolol (LOPRESSOR) 50 MG tablet Take 1 tablet (50 mg total) by mouth 2 (two) times daily. 60 tablet 1  . Omega-3 Fatty Acids (FISH OIL) 1000 MG CAPS Take 2,000 mg by mouth every morning.    .  pravastatin (PRAVACHOL) 40 MG tablet Take 40 mg by mouth every morning.     . predniSONE (DELTASONE) 20 MG tablet Take 1 tablet (20 mg total) by mouth daily with breakfast. 5 tablet 0  . predniSONE (DELTASONE) 20 MG tablet Take 1 tablet (20 mg total) by mouth daily with breakfast. 30 tablet 0  . tiotropium (SPIRIVA) 18 MCG inhalation capsule Place 18 mcg into inhaler and inhale every morning.      No current facility-administered medications for this visit.    OBJECTIVE:  There were no vitals filed for this visit.   There is no weight on file to calculate BMI.  ECOG FS:1 - Symptomatic but completely ambulatory  PHYSICAL EXAM: Gen. status: Alert and oriented individual not any acute distress Lungs: Emphysematous chest diminished air entry on both sides Cardiac: Days no irregular heart sounds soft systolic murmur. Abdominal exam revealed normal bowel sounds. The abdomen was soft, non-tender, and without masses, organomegaly, or appreciable enlargement of the abdominal aorta. Lymphatic system: Supraclavicular, cervical, axillary, inguinal lymph nodes are not palpable Examination of the skin revealed no evidence of significant rashes, suspicious appearing nevi or other concerning lesions. Neurologically, the patient was awake, alert, and oriented to person, place and time. There were no obvious focal neurologic abnormalities. Lower extremity no edema    LAB RESULTS:  Lab Results  Component Value Date   PSA 0.15 01/11/2015     STUDIES: Ct Chest W Contrast  05/12/2015  CLINICAL DATA:  Non-small-cell right lung cancer post radiation therapy. History of COPD/ emphysema and prostate cancer. Subsequent encounter. EXAM: CT CHEST WITH CONTRAST TECHNIQUE: Multidetector CT imaging of the chest was performed during intravenous contrast administration. CONTRAST:  23m OMNIPAQUE IOHEXOL 300 MG/ML  SOLN COMPARISON:  CT 12/14/2014.  PET-CT 01/06/2015. FINDINGS: Mediastinum/Nodes: There are no  enlarged mediastinal, hilar or axillary lymph nodes.Previously noted prominent right hilar node has improved, measuring 6 mm on image 36 (previously 10 mm). 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 The thyroid gland and trachea demonstrate no significant findings. The heart size is normal. There is no pericardial effusion. There is extensive atherosclerosis of the aorta, great vessels and coronary arteries. Lungs/Pleura: There is no pleural effusion. Again demonstrated is severe emphysema. The spiculated right upper lobe mass just superior to the minor fissure has decreased in size, now measuring 2.8 x 1.4 x 1.1 cm (previously 4.0 x 3.3 x 2.4 cm). There is some surrounding parenchymal opacity tracking posteriorly along the major fissure, probably atelectasis or inflammation. The 2.6 x 2.0 cm density within the lingula on image 49 remains elongated on the reformatted images and appears unchanged. This was not significantly hypermetabolic on PET-CT and is probably chronic atelectasis. No new or enlarging pulmonary nodules demonstrated. Upper abdomen: The visualized upper abdomen appears stable without suspicious findings. Musculoskeletal/Chest wall: There is no chest wall mass or suspicious osseous finding. There are stable mild degenerative changes throughout the spine. There is a stable probable sebaceous cyst in the mid right back. IMPRESSION: 1. Interval decreased size of primary spiculated mass in the right upper lobe following radiation therapy. Adjacent parenchymal opacities are probably related to radiation and reflect atelectasis or inflammation. 2. The lingular density is unchanged, not hypermetabolic on PET-CT and probably atelectasis. 3. No evidence of metastatic disease. 4. Stable incidental findings including severe emphysema, diffuse atherosclerosis and a large esophageal  epiphrenic diverticulum. Electronically Signed   By: WRichardean SaleM.D.   On: 05/12/2015 17:08   Lab Results  Component Value Date   PSA 0.15 01/11/2015    ASSESSMENT: Right upper lobe lung mass.  There is a spiculated lung mass approximately 3.3 cm suggestive of primary bronchogenic carcinoma. Carcinoma prostate on Lupron therapy last PSA 0.1 in April of 2016 PSA is 0.15 MEDICAL DECISION MAKING:  It upper lobe lung nodule is positive for non-small cell carcinoma of lung. Patient had radiation therapy.  Repeat PET scan has been reviewed and shows progressive decrease in the size of the pulmonary nodule however some radiation changes with mild radiation pneumonitis causing  shortness of breath.  Patient has been started on prednisone patient also is diabetic.  2.  Carcinoma of prostate  Will continue Lupron for 4 months. PSA report is pending will be reviewed. Records from pulmonologists office has been reviewed  Duration of visit is 25 minutes and 50% of time was spent discussing VARIOUS  options or alleviating care with other physicians social worker   No matching staging information was found for the patient.  Forest Gleason, MD   05/24/2015 11:22 AM

## 2015-05-24 NOTE — Addendum Note (Signed)
Addended by: Lorrine Kin A on: 05/24/2015 12:06 PM   Modules accepted: Orders

## 2015-05-24 NOTE — Progress Notes (Signed)
Patient here today for CT scan results.

## 2015-06-03 ENCOUNTER — Other Ambulatory Visit: Payer: Self-pay | Admitting: Internal Medicine

## 2015-06-14 ENCOUNTER — Ambulatory Visit (INDEPENDENT_AMBULATORY_CARE_PROVIDER_SITE_OTHER): Payer: Commercial Managed Care - HMO | Admitting: Internal Medicine

## 2015-06-14 ENCOUNTER — Encounter: Payer: Self-pay | Admitting: Internal Medicine

## 2015-06-14 VITALS — BP 132/74 | HR 85 | Ht 72.0 in | Wt 169.4 lb

## 2015-06-14 DIAGNOSIS — J449 Chronic obstructive pulmonary disease, unspecified: Secondary | ICD-10-CM | POA: Diagnosis not present

## 2015-06-14 MED ORDER — FLUTICASONE FUROATE-VILANTEROL 100-25 MCG/INH IN AEPB: 1.0000 | INHALATION_SPRAY | Freq: Every day | RESPIRATORY_TRACT | Status: AC

## 2015-06-14 MED ORDER — AZITHROMYCIN 250 MG PO TABS: ORAL_TABLET | ORAL | Status: DC

## 2015-06-14 MED ORDER — UMECLIDINIUM BROMIDE 62.5 MCG/INH IN AEPB: 1.0000 | INHALATION_SPRAY | Freq: Every day | RESPIRATORY_TRACT | Status: AC

## 2015-06-14 MED ORDER — FLUTICASONE FUROATE-VILANTEROL 100-25 MCG/INH IN AEPB: 1.0000 | INHALATION_SPRAY | Freq: Every day | RESPIRATORY_TRACT | Status: DC

## 2015-06-14 MED ORDER — UMECLIDINIUM BROMIDE 62.5 MCG/INH IN AEPB: 1.0000 | INHALATION_SPRAY | Freq: Every day | RESPIRATORY_TRACT | Status: DC

## 2015-06-14 NOTE — Progress Notes (Signed)
MRN# [id number] Alan Weaver 04-12-1927   CC: Chief Complaint  Patient presents with  . Follow-up    pt. states he is currently taking prednisone '20mg'$ . pt. feels breathing has wrosen. increased SOB. occ. prod. cough clear in color.  denies wheezing or chest pain/tightness. on 2L 02      HPI:  Patient with COPD-DOE and SOB with exertion Has been on prednisone for last 4 weeks On spiriva and advair  Completed SBRT on 02/22/15 for RUL mass. Today states that breathing has been a little more labored since his last visit, with gradual increase with DOE. He needs O2 all the time now, previously could move around the house without O2.  Been using rescue inhaler about 2-3 times per day over the last 2 weeks.     Review of Systems: Gen:  Denies  fever, sweats, chills HEENT: Denies blurred vision, double vision, ear pain, eye pain, hearing loss, nose bleeds, sore throat Cvc:  No dizziness, chest pain or heaviness Resp:   Admits ZJ:QBHALPF sob and sputum production Gi: Denies swallowing difficulty, stomach pain, nausea or vomiting, diarrhea, constipation, bowel incontinence Gu:  Denies bladder incontinence, burning urine Ext:   No Joint pain, stiffness or swelling Skin: No skin rash, easy bruising or bleeding or hives Endoc:  No polyuria, polydipsia , polyphagia or weight change Other:  All other systems negative  Allergies:  Review of patient's allergies indicates no known allergies.  Physical Examination:  VS: BP 132/74 mmHg  Pulse 85  Ht 6' (1.829 m)  Wt 169 lb 6.4 oz (76.839 kg)  BMI 22.97 kg/m2  SpO2 91%  General Appearance: No distress  HEENT: PERRLA, no ptosis, no other lesions noticed Pulmonary:normal breath sounds., diaphragmatic excursion normal.No wheezing, No rales , mild dec in BS at the bases.  Cardiovascular:  Normal S1,S2.  No m/r/g.     Abdomen:Exam: Benign, Soft, non-tender, No masses  Skin:   warm, no rashes, no ecchymosis  Extremities: normal, no cyanosis,  clubbing, warm with normal capillary refill.     Biopsy result from transbronch forcepts 01/07/15 DIAGNOSIS:  A. LUNG, RIGHT UPPER LOBE; ENB FORCEPS BIOPSY:  - NON-SMALL CELL CARCINOMA, FAVOR SQUAMOUS CELL CARCINOMA.   Note:  A cell block was included in the interpretation of this case. Specimen  reviewed in conjunction with ARC-133 with IHC results.    CT chest on 05/12/15 images reveiwed 06/14/2015   Assessment and Plan:81 yo with COPD end stage with chronic resp insufficiency, s\p bronch with biopsy for RUL mass, found to be NSCLC Patient with chronic worsening of SOB/DOE despite being on Spiriva and Advair and Prednisone   1.stop advair and spiriva change to Breo and Incruse 2.continue oxygen as prescribed and as needed 3.start daily azithromycin 4.continue dounebs as prescribed 5.continue prednisone as prescribed  Follow up in 1 month  I have personally obtained a history, examined the patient, evaluated Pertinent laboratory and RadioGraphic/imaging results, and  formulated the assessment and plan The Patient requires high complexity decision making for assessment and support, frequent evaluation and titration of therapies. Patient/Family are satisfied with Plan of action and management. All questions answered  Corrin Parker, M.D.  [facility]  Medical Director [place] Director [facility] Cardio-Pulmonary Department

## 2015-06-14 NOTE — Addendum Note (Signed)
Addended by: Oscar La R on: 06/14/2015 12:39 PM   Modules accepted: Orders

## 2015-06-14 NOTE — Patient Instructions (Signed)
Chronic Obstructive Pulmonary Disease Chronic obstructive pulmonary disease (COPD) is a common lung condition in which airflow from the lungs is limited. COPD is a general term that can be used to describe many different lung problems that limit airflow, including both chronic bronchitis and emphysema. If you have COPD, your lung function will probably never return to normal, but there are measures you can take to improve lung function and make yourself feel better. CAUSES   Smoking (common).  Exposure to secondhand smoke.  Genetic problems.  Chronic inflammatory lung diseases or recurrent infections. SYMPTOMS  Shortness of breath, especially with physical activity.  Deep, persistent (chronic) cough with a large amount of thick mucus.  Wheezing.  Rapid breaths (tachypnea).  Gray or bluish discoloration (cyanosis) of the skin, especially in your fingers, toes, or lips.  Fatigue.  Weight loss.  Frequent infections or episodes when breathing symptoms become much worse (exacerbations).  Chest tightness. DIAGNOSIS Your health care provider will take a medical history and perform a physical examination to diagnose COPD. Additional tests for COPD may include:  Lung (pulmonary) function tests.  Chest X-ray.  CT scan.  Blood tests. TREATMENT  Treatment for COPD may include:  Inhaler and nebulizer medicines. These help manage the symptoms of COPD and make your breathing more comfortable.  Supplemental oxygen. Supplemental oxygen is only helpful if you have a low oxygen level in your blood.  Exercise and physical activity. These are beneficial for nearly all people with COPD.  Lung surgery or transplant.  Nutrition therapy to gain weight, if you are underweight.  Pulmonary rehabilitation. This may involve working with a team of health care providers and specialists, such as respiratory, occupational, and physical therapists. HOME CARE INSTRUCTIONS  Take all medicines  (inhaled or pills) as directed by your health care provider.  Avoid over-the-counter medicines or cough syrups that dry up your airway (such as antihistamines) and slow down the elimination of secretions unless instructed otherwise by your health care provider.  If you are a smoker, the most important thing that you can do is stop smoking. Continuing to smoke will cause further lung damage and breathing trouble. Ask your health care provider for help with quitting smoking. He or she can direct you to community resources or hospitals that provide support.  Avoid exposure to irritants such as smoke, chemicals, and fumes that aggravate your breathing.  Use oxygen therapy and pulmonary rehabilitation if directed by your health care provider. If you require home oxygen therapy, ask your health care provider whether you should purchase a pulse oximeter to measure your oxygen level at home.  Avoid contact with individuals who have a contagious illness.  Avoid extreme temperature and humidity changes.  Eat healthy foods. Eating smaller, more frequent meals and resting before meals may help you maintain your strength.  Stay active, but balance activity with periods of rest. Exercise and physical activity will help you maintain your ability to do things you want to do.  Preventing infection and hospitalization is very important when you have COPD. Make sure to receive all the vaccines your health care provider recommends, especially the pneumococcal and influenza vaccines. Ask your health care provider whether you need a pneumonia vaccine.  Learn and use relaxation techniques to manage stress.  Learn and use controlled breathing techniques as directed by your health care provider. Controlled breathing techniques include:  Pursed lip breathing. Start by breathing in (inhaling) through your nose for 1 second. Then, purse your lips as if you were   going to whistle and breathe out (exhale) through the  pursed lips for 2 seconds.  Diaphragmatic breathing. Start by putting one hand on your abdomen just above your waist. Inhale slowly through your nose. The hand on your abdomen should move out. Then purse your lips and exhale slowly. You should be able to feel the hand on your abdomen moving in as you exhale.  Learn and use controlled coughing to clear mucus from your lungs. Controlled coughing is a series of short, progressive coughs. The steps of controlled coughing are: 1. Lean your head slightly forward. 2. Breathe in deeply using diaphragmatic breathing. 3. Try to hold your breath for 3 seconds. 4. Keep your mouth slightly open while coughing twice. 5. Spit any mucus out into a tissue. 6. Rest and repeat the steps once or twice as needed. SEEK MEDICAL CARE IF:  You are coughing up more mucus than usual.  There is a change in the color or thickness of your mucus.  Your breathing is more labored than usual.  Your breathing is faster than usual. SEEK IMMEDIATE MEDICAL CARE IF:  You have shortness of breath while you are resting.  You have shortness of breath that prevents you from:  Being able to talk.  Performing your usual physical activities.  You have chest pain lasting longer than 5 minutes.  Your skin color is more cyanotic than usual.  You measure low oxygen saturations for longer than 5 minutes with a pulse oximeter. MAKE SURE YOU:  Understand these instructions.  Will watch your condition.  Will get help right away if you are not doing well or get worse.   This information is not intended to replace advice given to you by your health care provider. Make sure you discuss any questions you have with your health care provider.   Document Released: 03/22/2005 Document Revised: 07/03/2014 Document Reviewed: 02/06/2013 Elsevier Interactive Patient Education 2016 [employer] Inc[employer]  

## 2015-06-15 ENCOUNTER — Telehealth: Payer: Self-pay | Admitting: Internal Medicine

## 2015-06-15 NOTE — Telephone Encounter (Signed)
I am okay with this. Please advised patient that he is allowed one which within our practice, and cannot switch back to me after this switch.  Thank you

## 2015-06-15 NOTE — Telephone Encounter (Signed)
Pt would like to switch from Dr. Stevenson Clinch to Dr. Mortimer Fries.  Dr. Stevenson Clinch, please advise if you are ok with this.  Thank you.

## 2015-06-15 NOTE — Telephone Encounter (Signed)
yes

## 2015-06-15 NOTE — Telephone Encounter (Signed)
Dr. Mortimer Fries are you ok with taking on this patient also?  Thanks!

## 2015-06-16 NOTE — Telephone Encounter (Signed)
Again, I am okay with this.  One switch within our practice.   Thank you

## 2015-06-16 NOTE — Telephone Encounter (Signed)
Pt informed. F/u appt scheduled and card mailed to pt per his request. Nothing further needed.

## 2015-06-20 ENCOUNTER — Emergency Department: Payer: Commercial Managed Care - HMO

## 2015-06-20 ENCOUNTER — Inpatient Hospital Stay
Admission: EM | Admit: 2015-06-20 | Discharge: 2015-06-22 | DRG: 189 | Disposition: A | Discharge status: Home / Self Care | Payer: Commercial Managed Care - HMO | Attending: Internal Medicine | Admitting: Internal Medicine

## 2015-06-20 DIAGNOSIS — Z7982 Long term (current) use of aspirin: Secondary | ICD-10-CM

## 2015-06-20 DIAGNOSIS — Z87442 Personal history of urinary calculi: Secondary | ICD-10-CM | POA: Diagnosis not present

## 2015-06-20 DIAGNOSIS — Z9981 Dependence on supplemental oxygen: Secondary | ICD-10-CM | POA: Diagnosis not present

## 2015-06-20 DIAGNOSIS — C3491 Malignant neoplasm of unspecified part of right bronchus or lung: Secondary | ICD-10-CM | POA: Diagnosis present

## 2015-06-20 DIAGNOSIS — Z79899 Other long term (current) drug therapy: Secondary | ICD-10-CM

## 2015-06-20 DIAGNOSIS — M545 Low back pain: Secondary | ICD-10-CM | POA: Diagnosis present

## 2015-06-20 DIAGNOSIS — J441 Chronic obstructive pulmonary disease with (acute) exacerbation: Secondary | ICD-10-CM | POA: Diagnosis present

## 2015-06-20 DIAGNOSIS — C3490 Malignant neoplasm of unspecified part of unspecified bronchus or lung: Secondary | ICD-10-CM | POA: Diagnosis not present

## 2015-06-20 DIAGNOSIS — M810 Age-related osteoporosis without current pathological fracture: Secondary | ICD-10-CM | POA: Diagnosis present

## 2015-06-20 DIAGNOSIS — I480 Paroxysmal atrial fibrillation: Secondary | ICD-10-CM | POA: Diagnosis present

## 2015-06-20 DIAGNOSIS — R0600 Dyspnea, unspecified: Secondary | ICD-10-CM | POA: Diagnosis present

## 2015-06-20 DIAGNOSIS — N189 Chronic kidney disease, unspecified: Secondary | ICD-10-CM | POA: Diagnosis present

## 2015-06-20 DIAGNOSIS — I129 Hypertensive chronic kidney disease with stage 1 through stage 4 chronic kidney disease, or unspecified chronic kidney disease: Secondary | ICD-10-CM | POA: Diagnosis present

## 2015-06-20 DIAGNOSIS — R0603 Acute respiratory distress: Secondary | ICD-10-CM

## 2015-06-20 DIAGNOSIS — Z923 Personal history of irradiation: Secondary | ICD-10-CM | POA: Diagnosis not present

## 2015-06-20 DIAGNOSIS — Z8052 Family history of malignant neoplasm of bladder: Secondary | ICD-10-CM | POA: Diagnosis not present

## 2015-06-20 DIAGNOSIS — E1122 Type 2 diabetes mellitus with diabetic chronic kidney disease: Secondary | ICD-10-CM | POA: Diagnosis present

## 2015-06-20 DIAGNOSIS — K219 Gastro-esophageal reflux disease without esophagitis: Secondary | ICD-10-CM | POA: Diagnosis present

## 2015-06-20 DIAGNOSIS — Z9889 Other specified postprocedural states: Secondary | ICD-10-CM | POA: Diagnosis not present

## 2015-06-20 DIAGNOSIS — E1142 Type 2 diabetes mellitus with diabetic polyneuropathy: Secondary | ICD-10-CM | POA: Diagnosis present

## 2015-06-20 DIAGNOSIS — Z7951 Long term (current) use of inhaled steroids: Secondary | ICD-10-CM

## 2015-06-20 DIAGNOSIS — Z87891 Personal history of nicotine dependence: Secondary | ICD-10-CM | POA: Diagnosis not present

## 2015-06-20 DIAGNOSIS — I10 Essential (primary) hypertension: Secondary | ICD-10-CM | POA: Diagnosis present

## 2015-06-20 DIAGNOSIS — Z825 Family history of asthma and other chronic lower respiratory diseases: Secondary | ICD-10-CM | POA: Diagnosis not present

## 2015-06-20 DIAGNOSIS — J9621 Acute and chronic respiratory failure with hypoxia: Secondary | ICD-10-CM | POA: Diagnosis present

## 2015-06-20 DIAGNOSIS — S32019A Unspecified fracture of first lumbar vertebra, initial encounter for closed fracture: Secondary | ICD-10-CM | POA: Diagnosis present

## 2015-06-20 DIAGNOSIS — Z800 Family history of malignant neoplasm of digestive organs: Secondary | ICD-10-CM | POA: Diagnosis not present

## 2015-06-20 DIAGNOSIS — J449 Chronic obstructive pulmonary disease, unspecified: Secondary | ICD-10-CM | POA: Diagnosis not present

## 2015-06-20 DIAGNOSIS — J438 Other emphysema: Secondary | ICD-10-CM | POA: Diagnosis not present

## 2015-06-20 DIAGNOSIS — R011 Cardiac murmur, unspecified: Secondary | ICD-10-CM | POA: Diagnosis present

## 2015-06-20 DIAGNOSIS — Z8546 Personal history of malignant neoplasm of prostate: Secondary | ICD-10-CM | POA: Diagnosis not present

## 2015-06-20 DIAGNOSIS — M549 Dorsalgia, unspecified: Secondary | ICD-10-CM | POA: Diagnosis present

## 2015-06-20 HISTORY — DX: Paroxysmal atrial fibrillation: I48.0

## 2015-06-20 LAB — CBC WITH DIFFERENTIAL/PLATELET
BASOS ABS: 0.1 10*3/uL (ref 0–0.1)
Basophils Relative: 1 %
EOS PCT: 1 %
Eosinophils Absolute: 0.1 10*3/uL (ref 0–0.7)
HEMATOCRIT: 45.9 % (ref 40.0–52.0)
Hemoglobin: 15 g/dL (ref 13.0–18.0)
LYMPHS PCT: 14 %
Lymphs Abs: 1.8 10*3/uL (ref 1.0–3.6)
MCH: 28.8 pg (ref 26.0–34.0)
MCHC: 32.7 g/dL (ref 32.0–36.0)
MCV: 88.2 fL (ref 80.0–100.0)
Monocytes Absolute: 0.9 10*3/uL (ref 0.2–1.0)
Monocytes Relative: 8 %
NEUTROS ABS: 9.7 10*3/uL — AB (ref 1.4–6.5)
NEUTROS PCT: 76 %
PLATELETS: 250 10*3/uL (ref 150–440)
RBC: 5.2 MIL/uL (ref 4.40–5.90)
RDW: 16.5 % — ABNORMAL HIGH (ref 11.5–14.5)
WBC: 12.6 10*3/uL — AB (ref 3.8–10.6)

## 2015-06-20 LAB — BASIC METABOLIC PANEL
ANION GAP: 9 (ref 5–15)
BUN: 25 mg/dL — ABNORMAL HIGH (ref 6–20)
CO2: 32 mmol/L (ref 22–32)
Calcium: 9.3 mg/dL (ref 8.9–10.3)
Chloride: 101 mmol/L (ref 101–111)
Creatinine, Ser: 1.18 mg/dL (ref 0.61–1.24)
GFR calc Af Amer: >60 mL/min (ref >60)
GFR, EST NON AFRICAN AMERICAN: 54 mL/min — AB (ref >60)
GLUCOSE: 166 mg/dL — AB (ref 65–99)
POTASSIUM: 4.5 mmol/L (ref 3.5–5.1)
Sodium: 142 mmol/L (ref 135–145)

## 2015-06-20 LAB — TROPONIN I: Troponin I: <0.03 ng/mL (ref <0.031)

## 2015-06-20 MED ORDER — IPRATROPIUM-ALBUTEROL 0.5-2.5 (3) MG/3ML IN SOLN
3.0000 mL | Freq: Once | RESPIRATORY_TRACT | Status: DC
  Administered 2015-06-20: 3 mL via RESPIRATORY_TRACT
  Filled 2015-06-20: qty 3

## 2015-06-20 MED ORDER — DEXTROSE 5 % IV SOLN
500.0000 mg | Freq: Once | INTRAVENOUS | Status: AC
  Administered 2015-06-20: 500 mg via INTRAVENOUS
  Filled 2015-06-20: qty 500

## 2015-06-20 MED ORDER — NITROGLYCERIN 2 % TD OINT
1.5000 [in_us] | TOPICAL_OINTMENT | Freq: Once | TRANSDERMAL | Status: AC
  Administered 2015-06-20: 1.5 [in_us] via TOPICAL

## 2015-06-20 NOTE — H&P (Signed)
[facility] NAME: Alan Weaver    MR#:  [id number]  DATE OF BIRTH:  [birth year]  DATE OF ADMISSION:  06/20/2015  PRIMARY CARE PHYSICIAN: Lelon Huh, MD   REQUESTING/REFERRING PHYSICIAN: Archie Balboa, M.D.  CHIEF COMPLAINT:   Chief Complaint  Patient presents with  . Shortness of Breath    HISTORY OF PRESENT ILLNESS:  Alan Weaver  is a 81 y.o. male who presents with back pain, and supposedly found to be in acute on chronic hypoxic respiratory failure. Patient states that his breathing has been getting worse than his baseline, which requires use of 2 L of oxygen at home and tenuously, for about the past 3-4 days. He called EMS to come to the ED today because he was having bilateral lower back pain. He denies any infectious symptomology, denies any urinary symptoms or difficulties, denies any nausea, vomiting, diarrhea, constipation, hematuria. He states that his lower back hurts whenever he takes deep breaths. When EMS got to his house he was found to have oxygen saturations around 80% on his 2 L of oxygen. He was given Solu-Medrol and placed on CPAP en route to the ED. Once he arrived here he was found to be significantly hypertensive with a blood pressure the 200s, but this corrected very quickly. Chest x-ray does not show any acute process, does show a right lung mass which is noted to be non-small cell lung cancer. Patient's white blood cell count was mildly elevated. Patient improved significantly on BiPAP and nebulizers. Hospitalists were called for admission for his respiratory problems, as well as further evaluation of his back pain.  PAST MEDICAL HISTORY:   Past Medical History  Diagnosis Date  . Heart murmur   . COPD (chronic obstructive pulmonary disease) ([place])   . High blood cholesterol level   . Irritable bowel syndrome   . Diabetes mellitus without complication ([place])   . Osteoporosis   . Incontinence   .  Paroxysmal a-fib ([place])   . Cancer [facility]) 1994    prostate, then lung  . Pneumonia   . Hypertension   . Chronic kidney disease     KIDNEY STONES  . GERD (gastroesophageal reflux disease)   . On home oxygen therapy     2L [place] PRN    PAST SURGICAL HISTORY:   Past Surgical History  Procedure Laterality Date  . Colonoscopy  03/12/13  . Prostate surgery  1994  . Hernia repair  2011  . Surgery for staph infection  1995  . Colonoscopy  2011  . Bone density test      Spine T-spine = -1.7  . Electromagnetic navigation brochoscopy N/A 01/07/2015    Procedure: ELECTROMAGNETIC NAVIGATION BRONCHOSCOPY;  Surgeon: Vilinda Boehringer, MD;  Location: [facility] ORS;  Service: Cardiopulmonary;  Laterality: N/A;    SOCIAL HISTORY:   Social History  Substance Use Topics  . Smoking status: Former Smoker -- 1.00 packs/day    Types: Cigarettes    Quit date: 05/15/2006  . Smokeless tobacco: Never Used  . Alcohol Use: No    FAMILY HISTORY:   Family History  Problem Relation Age of Onset  . Cancer Father     bladder  . Other Sister     brain tumor  . Cancer Brother     colon  . Asthma Mother   . Osteoporosis Mother     DRUG ALLERGIES:  No Known Allergies  MEDICATIONS AT HOME:   Prior to Admission medications  Medication Sig Start Date End Date Taking? Authorizing Provider  albuterol (PROVENTIL HFA;VENTOLIN HFA) 108 (90 BASE) MCG/ACT inhaler Inhale 2 puffs into the lungs every 6 (six) hours as needed for wheezing or shortness of breath.    Yes Historical Provider, MD  alendronate (FOSAMAX) 70 MG tablet Take 70 mg by mouth once a week. Pt takes on Sunday.    Take with a full glass of water on an empty stomach.   Yes Historical Provider, MD  amLODipine (NORVASC) 5 MG tablet Take 5 mg by mouth daily.   Yes Historical Provider, MD  aspirin EC 81 MG tablet Take 162 mg by mouth every 4 (four) hours as needed for mild pain.    Yes Historical Provider, MD  azithromycin (ZITHROMAX) 250 MG tablet Take  250 mg by mouth daily.   Yes Historical Provider, MD  Calcium Carbonate-Vitamin D (CALCIUM 600+D) 600-400 MG-UNIT tablet Take 1 tablet by mouth 2 (two) times daily.   Yes Historical Provider, MD  cholecalciferol (VITAMIN D) 1000 UNITS tablet Take 2,000 Units by mouth daily.    Yes Historical Provider, MD  Cranberry-Vitamin C-Probiotic (AZO CRANBERRY) 250-30 MG TABS Take 2 tablets by mouth every evening.   Yes Historical Provider, MD  Fluticasone Furoate-Vilanterol 100-25 MCG/INH AEPB Inhale 1 puff into the lungs daily. 06/14/15  Yes Flora Lipps, MD  ipratropium-albuterol (DUONEB) 0.5-2.5 (3) MG/3ML SOLN Take 3 mLs by nebulization 4 (four) times daily. 03/16/15  Yes Dennis E Chrismon, PA  Leuprolide Acetate (LUPRON IJ) Inject 1 Dose as directed every 3 (three) months.    Yes Historical Provider, MD  metFORMIN (GLUCOPHAGE) 500 MG tablet Take 500 mg by mouth daily.   Yes Historical Provider, MD  metoprolol (LOPRESSOR) 50 MG tablet Take 1 tablet (50 mg total) by mouth 2 (two) times daily. 11/25/14  Yes Gladstone Lighter, MD  Omega-3 Fatty Acids (FISH OIL) 1000 MG CAPS Take 2,000 mg by mouth daily.    Yes Historical Provider, MD  pravastatin (PRAVACHOL) 40 MG tablet Take 40 mg by mouth daily.    Yes Historical Provider, MD  predniSONE (DELTASONE) 20 MG tablet Take 1 tablet (20 mg total) by mouth daily with breakfast. 05/17/15  Yes Vishal Mungal, MD  Umeclidinium Bromide (INCRUSE ELLIPTA) 62.5 MCG/INH AEPB Inhale 1 puff into the lungs daily. 06/14/15  Yes Flora Lipps, MD    REVIEW OF SYSTEMS:  Review of Systems  Constitutional: Negative for fever, chills, weight loss and malaise/fatigue.  HENT: Negative for ear pain, hearing loss and tinnitus.   Eyes: Negative for blurred vision, double vision, pain and redness.  Respiratory: Positive for shortness of breath and wheezing. Negative for cough and hemoptysis.   Cardiovascular: Negative for chest pain, palpitations, orthopnea and leg swelling.   Gastrointestinal: Negative for nausea, vomiting, abdominal pain, diarrhea and constipation.  Genitourinary: Negative for dysuria, frequency and hematuria.  Musculoskeletal: Positive for back pain (bilateral lower back). Negative for joint pain and neck pain.  Skin:       No acne, rash, or lesions  Neurological: Negative for dizziness, tremors, focal weakness and weakness.  Endo/Heme/Allergies: Negative for polydipsia. Does not bruise/bleed easily.  Psychiatric/Behavioral: Negative for depression. The patient is not nervous/anxious and does not have insomnia.      VITAL SIGNS:   Filed Vitals:   06/20/15 2117 06/20/15 2130 06/20/15 2149 06/20/15 2200  BP: 102/68 110/85 110/85 114/72  Pulse: 105 93 95 93  Temp:      TempSrc:      Resp: 29  $'16 18 16  'B$ SpO2: 96% 94% 94% 97%   Wt Readings from Last 3 Encounters:  06/14/15 76.839 kg (169 lb 6.4 oz)  05/24/15 76.2 kg (167 lb 15.9 oz)  05/13/15 76.93 kg (169 lb 9.6 oz)    PHYSICAL EXAMINATION:  Physical Exam  Vitals reviewed. Constitutional: He is oriented to person, place, and time. He appears well-developed and well-nourished. No distress.  HENT:  Head: Normocephalic and atraumatic.  Mouth/Throat: Oropharynx is clear and moist.  Eyes: Conjunctivae and EOM are normal. Pupils are equal, round, and reactive to light. No scleral icterus.  Neck: Normal range of motion. Neck supple. No JVD present. No thyromegaly present.  Cardiovascular: Normal rate, regular rhythm and intact distal pulses.  Exam reveals no gallop and no friction rub.   No murmur heard. Respiratory: He is in respiratory distress (stable on BiPAP). He has wheezes (Soft and minimal). He has no rales.  Overall diminished breath sounds  GI: Soft. Bowel sounds are normal. He exhibits no distension. There is no tenderness.  Musculoskeletal: Normal range of motion. He exhibits tenderness (bilateral lower back). He exhibits no edema.  No arthritis, no gout  Lymphadenopathy:     He has no cervical adenopathy.  Neurological: He is alert and oriented to person, place, and time. No cranial nerve deficit.  No dysarthria, no aphasia  Skin: Skin is warm and dry. No rash noted. No erythema.  Psychiatric: He has a normal mood and affect. His behavior is normal. Judgment and thought content normal.    LABORATORY PANEL:   CBC  Recent Labs Lab 06/20/15 2042  WBC 12.6*  HGB 15.0  HCT 45.9  PLT 250   ------------------------------------------------------------------------------------------------------------------  Chemistries   Recent Labs Lab 06/20/15 2042  NA 142  K 4.5  CL 101  CO2 32  GLUCOSE 166*  BUN 25*  CREATININE 1.18  CALCIUM 9.3   ------------------------------------------------------------------------------------------------------------------  Cardiac Enzymes  Recent Labs Lab 06/20/15 2042  TROPONINI <0.03   ------------------------------------------------------------------------------------------------------------------  RADIOLOGY:  Dg Chest Portable 1 View  06/20/2015  CLINICAL DATA:  Lung cancer.  Short of breath. EXAM: PORTABLE CHEST 1 VIEW COMPARISON:  01/07/2015 FINDINGS: COPD with hyperinflation.  There is extensive emphysema on the left. Mass lesion in the right mid lung has improved in the interval. There is some linear density in the right midlung without definite mass lesion on the current study. Negative for heart failure.  Negative for pneumonia or effusion. IMPRESSION: COPD without acute cardiopulmonary abnormality. Spiculated mass right mid long has greatly improved in the interval. Electronically Signed   By: Franchot Gallo M.D.   On: 06/20/2015 21:02    EKG:   Orders placed or performed during the hospital encounter of 06/20/15  . EKG 12-Lead  . EKG 12-Lead  . EKG 12-Lead  . EKG 12-Lead    IMPRESSION AND PLAN:  Principal Problem:   Acute on chronic respiratory failure with hypoxia (HCC) - most likely due to COPD  exacerbation. Patient is improving significantly on BiPAP, and after treatment for COPD. See below for the same. We will wean him from BiPAP to nasal cannula as possible. Active Problems:   COPD exacerbation ([place]) - Solu-Medrol en Route Via EMS. We Will Continue This While Here. Per Chart Review He Is Been on Prednisone Recently by His Outpatient Pulmonologist. We will treat here with Solu-Medrol, duo nebs, continue his prescribed inhalers, and get a pulmonology consult.   HTN (hypertension) - blood pressure is initially significantly elevated, but has controlled very  nicely here once his breathing improved. We'll continue his home antihypertensives.   DM type 2 with diabetic peripheral neuropathy (HCC) - continue home metformin, carb modified diet, sliding scale insulin with glucose checks.   Non-small cell cancer of right lung (HCC) - likely contributes to his baseline poor respiratory reserve. He is actively being treated for this, though I do not suspect that it is the acute cause of his respiratory failure.   Back pain - unclear cause for this at this time. Perhaps related to his kidneys. He does have a history of having some renal stones, though he states this was only one time. We'll start with urinalysis for evaluation.   Paroxysmal a-fib (HCC) - not in A. fib currently, continue home rate controlling medicines  All the records are reviewed and case discussed with ED provider. Management plans discussed with the patient and/or family.  DVT PROPHYLAXIS: SubQ lovenox  GI PROPHYLAXIS: None  ADMISSION STATUS: Inpatient  CODE STATUS: Full  TOTAL TIME TAKING CARE OF THIS PATIENT: 45 minutes.    Evangelene Vora FIELDING 06/20/2015, 10:44 PM  [facility]  Office  [phone]  CC: Primary care physician; Lelon Huh, MD

## 2015-06-20 NOTE — ED Notes (Signed)
Pt tolerating nasal cannula oxygen well. Pt able to speak in full sentences without difficulty. Vss.

## 2015-06-20 NOTE — ED Notes (Signed)
Pts BP 110/85. MD Archie Balboa informed. Nitro paste removed

## 2015-06-20 NOTE — ED Notes (Addendum)
Pt bib EMS w/ c/o SOB.  Per EMS pt called out w/ lower back pain, upon ambulation to BR pt experienced SOB and felt "like he couldn't make it".  Per EMS pt 80% ra.  Pt recently finished chemo for lung CA.  Pt arrives on CPAP, accessory muscles used, labored breathing.

## 2015-06-20 NOTE — ED Provider Notes (Signed)
[facility] Emergency Department Provider Note    ____________________________________________  Time seen: On EMS arrival  I have reviewed the triage vital signs and the nursing notes.   HISTORY  Chief Complaint Shortness of Breath   History limited by: Limited by acute respiratory distress   HPI Alan Weaver is a 81 y.o. male who presents to the emergency department today via EMS because of concerns for respiratory distress. EMS states that they originally called out secondary for problems with urination. They state when the patient went up to urinate however he became acutely short of breath. Patient is on oxygen at home. Patient does have history of COPD. History is limited by patient's respiratory distress. He is unable to speak in full sentences.   Past Medical History  Diagnosis Date  . Heart murmur   . COPD (chronic obstructive pulmonary disease) ([place])   . High blood cholesterol level   . Irritable bowel syndrome   . Diabetes mellitus without complication ([place])   . Osteoporosis   . Incontinence   . COPD (chronic obstructive pulmonary disease) ([place]) 2000  . Atrial fibrillation ([place])   . Cancer [facility]) 1994    prostate  . Pneumonia   . Hypertension   . Chronic kidney disease     KIDNEY STONES  . GERD (gastroesophageal reflux disease)   . On home oxygen therapy     2L [place] PRN    Patient Active Problem List   Diagnosis Date Noted  . Non-small cell cancer of right lung ([place]) 02/09/2015  . Dyspnea 12/24/2014  . COPD (chronic obstructive pulmonary disease) ([place]) 12/24/2014  . Hypoxia 12/24/2014  . Allergic rhinitis 12/08/2014  . Diverticulosis 12/08/2014  . DM type 2 with diabetic peripheral neuropathy ([place]) 12/08/2014  . Mixed hyperlipidemia 12/08/2014  . Osteoporosis 12/08/2014  . Prostate cancer ([place]) 12/08/2014  . Pruritic dermatitis 12/08/2014  . Chronic obstructive pulmonary emphysema ([place]) 12/08/2014  . Lung mass 12/08/2014  .  Sepsis ([place]) 11/24/2014  . Pneumonia 11/24/2014  . Acute respiratory failure ([place]) 11/24/2014  . COPD exacerbation ([place]) 11/22/2014  . Personal history of colonic polyps 01/08/2013    Past Surgical History  Procedure Laterality Date  . Colonoscopy  03/12/13  . Prostate surgery  1994  . Hernia repair  2011  . Surgery for staph infection  1995  . Colonoscopy  2011  . Bone density test      Spine T-spine = -1.7  . Electromagnetic navigation brochoscopy N/A 01/07/2015    Procedure: ELECTROMAGNETIC NAVIGATION BRONCHOSCOPY;  Surgeon: Vilinda Boehringer, MD;  Location: [facility] ORS;  Service: Cardiopulmonary;  Laterality: N/A;    Current Outpatient Rx  Name  Route  Sig  Dispense  Refill  . albuterol (PROVENTIL HFA;VENTOLIN HFA) 108 (90 BASE) MCG/ACT inhaler   Inhalation   Inhale 2 puffs into the lungs every 6 (six) hours as needed for wheezing or shortness of breath.          [name] alendronate (FOSAMAX) 70 MG tablet   Oral   Take 70 mg by mouth once a week. Take on Sunday. Take with a full glass of water on an empty stomach.         [name] amLODipine (NORVASC) 5 MG tablet      TAKE ONE TABLET BY MOUTH ONCE DAILY   30 tablet   3   . aspirin EC 81 MG tablet   Oral   Take 81 mg by mouth as needed.          [name]  Calcium Carbonate-Vitamin D (CALCIUM + D PO)   Oral   Take 600 mg by mouth 2 (two) times daily.         . cholecalciferol (VITAMIN D) 1000 UNITS tablet   Oral   Take 2,000 Units by mouth every morning.         . Fluticasone Furoate-Vilanterol 100-25 MCG/INH AEPB   Inhalation   Inhale 1 puff into the lungs daily.   60 each   3   . ipratropium-albuterol (DUONEB) 0.5-2.5 (3) MG/3ML SOLN   Nebulization   Take 3 mLs by nebulization 4 (four) times daily.   360 mL   1   . Leuprolide Acetate (LUPRON IJ)   Injection   Inject 1 each as directed every 3 (three) months.          . metFORMIN (GLUCOPHAGE) 500 MG tablet      TAKE ONE TABLET BY MOUTH ONCE DAILY   30 tablet    6   . metoprolol (LOPRESSOR) 50 MG tablet   Oral   Take 1 tablet (50 mg total) by mouth 2 (two) times daily.   60 tablet   1   . Omega-3 Fatty Acids (FISH OIL) 1000 MG CAPS   Oral   Take 2,000 mg by mouth every morning.         . pravastatin (PRAVACHOL) 40 MG tablet   Oral   Take 40 mg by mouth every morning.          . predniSONE (DELTASONE) 20 MG tablet   Oral   Take 1 tablet (20 mg total) by mouth daily with breakfast.   5 tablet   0   . predniSONE (DELTASONE) 20 MG tablet   Oral   Take 1 tablet (20 mg total) by mouth daily with breakfast.   30 tablet   0   . Umeclidinium Bromide (INCRUSE ELLIPTA) 62.5 MCG/INH AEPB   Inhalation   Inhale 1 puff into the lungs daily.   30 each   5     Allergies Review of patient's allergies indicates no known allergies.  Family History  Problem Relation Age of Onset  . Cancer Father     bladder  . Other Sister     brain tumor  . Cancer Brother     colon  . Asthma Mother   . Osteoporosis Mother     Social History Social History  Substance Use Topics  . Smoking status: Former Smoker -- 1.00 packs/day    Types: Cigarettes    Quit date: 05/15/2006  . Smokeless tobacco: Never Used  . Alcohol Use: No    Review of Systems  Constitutional: Negative for fever. Cardiovascular: Negative for chest pain. Respiratory: Positive for shortness of breath. Gastrointestinal: Negative for abdominal pain, vomiting and diarrhea. Neurological: Negative for headaches, focal weakness or numbness.   10-point ROS otherwise negative.  ____________________________________________   PHYSICAL EXAM:  VITAL SIGNS: ED Triage Vitals  Enc Vitals Group     BP 06/20/15 2045 203/101 mmHg     Pulse Rate 06/20/15 2045 110     Resp 06/20/15 2045 25     Temp --      Temp src --      SpO2 06/20/15 2045 94 %   Constitutional: Awake and alert. Severe respiratory distress, on CPAP Eyes: Conjunctivae are normal. PERRL. Normal extraocular  movements. ENT   Head: Normocephalic and atraumatic.   Nose: No congestion/rhinnorhea.   Mouth/Throat: Mucous membranes are moist.   Neck:  No stridor. Hematological/Lymphatic/Immunilogical: No cervical lymphadenopathy. Cardiovascular: Tachycardic. regular rhythm.  No murmurs, rubs, or gallops. Respiratory: Severe respiratory distress. Absent lung sounds in the left lung. Decreased lung sounds in the lower right lung.  Gastrointestinal: Soft and nontender. No distention.  Genitourinary: Deferred Musculoskeletal: Normal range of motion in all extremities. No joint effusions.  No lower extremity tenderness nor edema. Neurologic:  Normal speech and language. No gross focal neurologic deficits are appreciated.  Skin:  Skin is warm, dry and intact. No rash noted. Psychiatric: Mood and affect are normal. Speech and behavior are normal. Patient exhibits appropriate insight and judgment.  ____________________________________________    LABS (pertinent positives/negatives)  Labs Reviewed  CBC WITH DIFFERENTIAL/PLATELET - Abnormal; Notable for the following:    WBC 12.6 (*)    RDW 16.5 (*)    Neutro Abs 9.7 (*)    All other components within normal limits  BASIC METABOLIC PANEL - Abnormal; Notable for the following:    Glucose, Bld 166 (*)    BUN 25 (*)    GFR calc non Af Amer 54 (*)    All other components within normal limits  CULTURE, BLOOD (ROUTINE X 2)  CULTURE, BLOOD (ROUTINE X 2)  TROPONIN I     ____________________________________________   EKG  I, Nance Pear, attending physician, personally viewed and interpreted this EKG  EKG Time: 2042 Rate: 119 Rhythm: sinus tachycardia Axis: normal Intervals: qtc 480 QRS: narrow, q waves V1, V2, V3 ST changes: no st elevation Impression: abnormal ekg ____________________________________________    RADIOLOGY  CXR  IMPRESSION: COPD without acute cardiopulmonary abnormality.  Spiculated mass right mid  long has greatly improved in the interval.  ____________________________________________   PROCEDURES  Procedure(s) performed: None  Critical Care performed: Yes, see critical care note(s)  CRITICAL CARE Performed by: Nance Pear   Total critical care time: 40 minutes  Critical care time was exclusive of separately billable procedures and treating other patients.  Critical care was necessary to treat or prevent imminent or life-threatening deterioration.  Critical care was time spent personally by me on the following activities: development of treatment plan with patient and/or surrogate as well as nursing, discussions with consultants, evaluation of patient's response to treatment, examination of patient, obtaining history from patient or surrogate, ordering and performing treatments and interventions, ordering and review of laboratory studies, ordering and review of radiographic studies, pulse oximetry and re-evaluation of patient's condition.  ____________________________________________   INITIAL IMPRESSION / ASSESSMENT AND PLAN / ED COURSE  Pertinent labs & imaging results that were available during my care of the patient were reviewed by me and considered in my medical decision making (see chart for details).  Patient presented to the emergency department in emergency traffic secondary to respiratory distress. Initial exam showed patient to be in severe respiratory distress. He was on CPAP by EMS. They did give Solu-Medrol. They attempted to give doing nebs without great success. The patient was also found to be quite hypertensive. Given the somewhat acute nature of the respiratory distress I did have initial concerns for flash pulmonary edema and Nitropaste was applied. This did help bring down the patient's blood pressure. The patient's x-ray however did not show any edema. The Nitropaste was removed. The patient did improve clinically on the BiPAP. The patient was  admitted to the hospital service. The patient was then taken off the BiPAP and appeared to continue to do well. This point likely COPD exacerbation.  ____________________________________________   FINAL CLINICAL IMPRESSION(S) / ED  DIAGNOSES  Final diagnoses:  Respiratory distress  COPD exacerbation ([place])     Nance Pear, MD 06/20/15 [phone]

## 2015-06-20 NOTE — ED Notes (Signed)
Report from nellie, rn. Pt tolerating bipap well. Pt provided with blankets for comfort. Call bell at side. Explanation of admission process provided to pt and family who verbalize understanding.

## 2015-06-20 NOTE — Progress Notes (Signed)
Pt weaned from Bipap to [place] @ 2 lpm (per home regimen). Dyspnea much improved. O2 sat 96.

## 2015-06-21 ENCOUNTER — Inpatient Hospital Stay: Payer: Commercial Managed Care - HMO

## 2015-06-21 DIAGNOSIS — C3490 Malignant neoplasm of unspecified part of unspecified bronchus or lung: Secondary | ICD-10-CM

## 2015-06-21 DIAGNOSIS — J438 Other emphysema: Secondary | ICD-10-CM

## 2015-06-21 DIAGNOSIS — J449 Chronic obstructive pulmonary disease, unspecified: Secondary | ICD-10-CM

## 2015-06-21 DIAGNOSIS — J9621 Acute and chronic respiratory failure with hypoxia: Principal | ICD-10-CM

## 2015-06-21 LAB — BASIC METABOLIC PANEL
ANION GAP: 6 (ref 5–15)
BUN: 25 mg/dL — ABNORMAL HIGH (ref 6–20)
CHLORIDE: 104 mmol/L (ref 101–111)
CO2: 30 mmol/L (ref 22–32)
Calcium: 8.6 mg/dL — ABNORMAL LOW (ref 8.9–10.3)
Creatinine, Ser: 0.96 mg/dL (ref 0.61–1.24)
GFR calc Af Amer: >60 mL/min (ref >60)
GLUCOSE: 246 mg/dL — AB (ref 65–99)
POTASSIUM: 4.4 mmol/L (ref 3.5–5.1)
Sodium: 140 mmol/L (ref 135–145)

## 2015-06-21 LAB — URINALYSIS COMPLETE WITH MICROSCOPIC (ARMC ONLY)
BACTERIA UA: NONE SEEN
BILIRUBIN URINE: NEGATIVE
Hgb urine dipstick: NEGATIVE
LEUKOCYTES UA: NEGATIVE
Nitrite: NEGATIVE
PROTEIN: NEGATIVE mg/dL
Specific Gravity, Urine: 1.021 (ref 1.005–1.030)
pH: 5 (ref 5.0–8.0)

## 2015-06-21 LAB — HEMOGLOBIN A1C: Hgb A1c MFr Bld: 7 % — ABNORMAL HIGH (ref 4.0–6.0)

## 2015-06-21 LAB — GLUCOSE, CAPILLARY
GLUCOSE-CAPILLARY: 224 mg/dL — AB (ref 65–99)
Glucose-Capillary: 257 mg/dL — ABNORMAL HIGH (ref 65–99)
Glucose-Capillary: 186 mg/dL — ABNORMAL HIGH (ref 65–99)
Glucose-Capillary: 171 mg/dL — ABNORMAL HIGH (ref 65–99)

## 2015-06-21 LAB — CBC
HEMATOCRIT: 40.3 % (ref 40.0–52.0)
HEMOGLOBIN: 13.4 g/dL (ref 13.0–18.0)
MCH: 28.5 pg (ref 26.0–34.0)
MCHC: 33.2 g/dL (ref 32.0–36.0)
MCV: 86.1 fL (ref 80.0–100.0)
Platelets: 211 10*3/uL (ref 150–440)
RBC: 4.68 MIL/uL (ref 4.40–5.90)
RDW: 16.3 % — ABNORMAL HIGH (ref 11.5–14.5)
WBC: 8.6 10*3/uL (ref 3.8–10.6)

## 2015-06-21 MED ORDER — IPRATROPIUM-ALBUTEROL 0.5-2.5 (3) MG/3ML IN SOLN
3.0000 mL | RESPIRATORY_TRACT | Status: DC
  Administered 2015-06-21 – 2015-06-22 (×9): 3 mL via RESPIRATORY_TRACT
  Filled 2015-06-21 (×9): qty 3

## 2015-06-21 MED ORDER — ACETAMINOPHEN 325 MG PO TABS: 650.0000 mg | ORAL_TABLET | Freq: Four times a day (QID) | ORAL | Status: DC | PRN

## 2015-06-21 MED ORDER — MOMETASONE FURO-FORMOTEROL FUM 100-5 MCG/ACT IN AERO
2.0000 | INHALATION_SPRAY | Freq: Two times a day (BID) | RESPIRATORY_TRACT | Status: DC
  Administered 2015-06-21 – 2015-06-22 (×3): 2 via RESPIRATORY_TRACT
  Filled 2015-06-21: qty 8.8

## 2015-06-21 MED ORDER — ACETAMINOPHEN 650 MG RE SUPP: 650.0000 mg | Freq: Four times a day (QID) | RECTAL | Status: DC | PRN

## 2015-06-21 MED ORDER — TIOTROPIUM BROMIDE MONOHYDRATE 18 MCG IN CAPS
1.0000 | ORAL_CAPSULE | Freq: Every day | RESPIRATORY_TRACT | Status: DC
  Administered 2015-06-21 – 2015-06-22 (×2): 18 ug via RESPIRATORY_TRACT
  Filled 2015-06-21: qty 5

## 2015-06-21 MED ORDER — OXYCODONE HCL 5 MG PO TABS: 5.0000 mg | ORAL_TABLET | ORAL | Status: DC | PRN

## 2015-06-21 MED ORDER — ASPIRIN EC 81 MG PO TBEC
162.0000 mg | DELAYED_RELEASE_TABLET | ORAL | Status: DC | PRN
  Administered 2015-06-22: 162 mg via ORAL
  Filled 2015-06-21: qty 2

## 2015-06-21 MED ORDER — METFORMIN HCL 500 MG PO TABS
500.0000 mg | ORAL_TABLET | Freq: Every day | ORAL | Status: DC
  Administered 2015-06-21 – 2015-06-22 (×2): 500 mg via ORAL
  Filled 2015-06-21 (×2): qty 1

## 2015-06-21 MED ORDER — INSULIN ASPART 100 UNIT/ML ~~LOC~~ SOLN: 0.0000 [IU] | Freq: Every day | SUBCUTANEOUS | Status: DC

## 2015-06-21 MED ORDER — METOPROLOL TARTRATE 50 MG PO TABS
50.0000 mg | ORAL_TABLET | Freq: Two times a day (BID) | ORAL | Status: DC
  Administered 2015-06-21 – 2015-06-22 (×4): 50 mg via ORAL
  Filled 2015-06-21 (×4): qty 1

## 2015-06-21 MED ORDER — INSULIN ASPART 100 UNIT/ML ~~LOC~~ SOLN
0.0000 [IU] | Freq: Three times a day (TID) | SUBCUTANEOUS | Status: DC
  Administered 2015-06-21: 12:00:00 5 [IU] via SUBCUTANEOUS
  Administered 2015-06-21: 2 [IU] via SUBCUTANEOUS
  Administered 2015-06-21 – 2015-06-22 (×2): 3 [IU] via SUBCUTANEOUS
  Administered 2015-06-22: 5 [IU] via SUBCUTANEOUS
  Filled 2015-06-21: qty 5
  Filled 2015-06-21: qty 2
  Filled 2015-06-21 (×2): qty 3
  Filled 2015-06-21: qty 5

## 2015-06-21 MED ORDER — PRAVASTATIN SODIUM 40 MG PO TABS
40.0000 mg | ORAL_TABLET | Freq: Every day | ORAL | Status: DC
  Administered 2015-06-21 – 2015-06-22 (×2): 40 mg via ORAL
  Filled 2015-06-21 (×2): qty 1

## 2015-06-21 MED ORDER — ENOXAPARIN SODIUM 40 MG/0.4ML ~~LOC~~ SOLN
40.0000 mg | Freq: Every day | SUBCUTANEOUS | Status: DC
  Administered 2015-06-21 (×2): 40 mg via SUBCUTANEOUS
  Filled 2015-06-21 (×2): qty 0.4

## 2015-06-21 MED ORDER — AMLODIPINE BESYLATE 5 MG PO TABS
5.0000 mg | ORAL_TABLET | Freq: Every day | ORAL | Status: DC
  Administered 2015-06-21 – 2015-06-22 (×2): 5 mg via ORAL
  Filled 2015-06-21 (×2): qty 1

## 2015-06-21 MED ORDER — AZITHROMYCIN 250 MG PO TABS
250.0000 mg | ORAL_TABLET | Freq: Every day | ORAL | Status: DC
  Administered 2015-06-21 – 2015-06-22 (×2): 250 mg via ORAL
  Filled 2015-06-21 (×2): qty 1

## 2015-06-21 MED ORDER — DEXTROSE 5 % IV SOLN
500.0000 mg | Freq: Once | INTRAVENOUS | Status: DC
  Filled 2015-06-21: qty 500

## 2015-06-21 MED ORDER — ONDANSETRON HCL 4 MG PO TABS: 4.0000 mg | ORAL_TABLET | Freq: Four times a day (QID) | ORAL | Status: DC | PRN

## 2015-06-21 MED ORDER — ONDANSETRON HCL 4 MG/2ML IJ SOLN: 4.0000 mg | Freq: Four times a day (QID) | INTRAMUSCULAR | Status: DC | PRN

## 2015-06-21 MED ORDER — METHYLPREDNISOLONE SODIUM SUCC 125 MG IJ SOLR
60.0000 mg | Freq: Four times a day (QID) | INTRAMUSCULAR | Status: DC
  Administered 2015-06-21 (×2): 60 mg via INTRAVENOUS
  Filled 2015-06-21 (×2): qty 2

## 2015-06-21 MED ORDER — DOCUSATE SODIUM 100 MG PO CAPS
100.0000 mg | ORAL_CAPSULE | Freq: Two times a day (BID) | ORAL | Status: DC
  Administered 2015-06-22: 09:00:00 100 mg via ORAL
  Filled 2015-06-21 (×2): qty 1

## 2015-06-21 MED ORDER — MAGNESIUM HYDROXIDE 400 MG/5ML PO SUSP: 30.0000 mL | Freq: Every day | ORAL | Status: DC | PRN

## 2015-06-21 MED ORDER — SODIUM CHLORIDE 0.9 % IJ SOLN
3.0000 mL | Freq: Two times a day (BID) | INTRAMUSCULAR | Status: DC
  Administered 2015-06-21 – 2015-06-22 (×4): 3 mL via INTRAVENOUS

## 2015-06-21 MED ORDER — METHYLPREDNISOLONE SODIUM SUCC 125 MG IJ SOLR
60.0000 mg | Freq: Two times a day (BID) | INTRAMUSCULAR | Status: DC
  Administered 2015-06-21 – 2015-06-22 (×2): 60 mg via INTRAVENOUS
  Filled 2015-06-21 (×2): qty 2

## 2015-06-21 NOTE — Plan of Care (Signed)
Problem: Education: Goal: Knowledge of [facility] Education information/materials will improve Outcome: Progressing Pt admitted wih COPD, hx lung ca, HTN, DM, GERD, Afib Controlled with home meds  Problem: Safety: Goal: Ability to remain free from injury will improve Outcome: Progressing Pt is high fall risk-bed alarm activated No injury this shift  Problem: Pain Managment: Goal: General experience of comfort will improve Outcome: Progressing Denies pain  Problem: Activity: Goal: Risk for activity intolerance will decrease Outcome: Not Progressing SOB on rest

## 2015-06-21 NOTE — Evaluation (Signed)
Physical Therapy Evaluation Patient Details Name: Alan Weaver MRN: [id number] DOB: 05-May-1927 Today's Date: 06/21/2015   History of Present Illness  Patient is an 81 y/o male that presents with COPD exacerbation and recent onset low back pain. Patient found to have an undetermined age L1 fx not seen in July, though no WB status changes have been reported and patient reports no falls recently.   Clinical Impression  Patient has been limited with his mobility for some time now it seems (per his report), and appears to be roughly at his baseline level of mobility. He becomes fatigued quickly with exertion, even on 3L (O2 sats dropped from 97 to 92%) however no loss of balance noted. Patient has apparently been ambulating short distances at home, and is nearby the commode if/when needed. Family stops by frequently to assist with meals and groceries. PT recommended patient have assistance throughout the day available, though patient declined stating he has been functioning in this current condition for some time now. Would recommended home health PT upon discharge to increase his balance and capacity for mobility.     Follow Up Recommendations Home health PT;Supervision for mobility/OOB    Equipment Recommendations       Recommendations for Other Services       Precautions / Restrictions Precautions Precautions: Fall Restrictions Weight Bearing Restrictions: No      Mobility  Bed Mobility Overal bed mobility: Modified Independent             General bed mobility comments: Patient is able to use bed rails to bring himself over the edge of the bed.   Transfers Overall transfer level: Needs assistance Equipment used: Rolling walker (2 wheeled) Transfers: Sit to/from Stand Sit to Stand: Supervision         General transfer comment: Patient requires 2 attempts to transfer sit to stand with RW, no loss of balance noted.   Ambulation/Gait Ambulation/Gait assistance:  Supervision Ambulation Distance (Feet): 125 Feet Assistive device: Rolling walker (2 wheeled) Gait Pattern/deviations: Step-through pattern;Decreased step length - right;Decreased step length - left   Gait velocity interpretation: Below normal speed for age/gender General Gait Details: Patient ambulates with RW and no loss of balance with reciprocal step through pattern. Patient does fatigue midway through ambulation, though per his report this was a longer distance than he typically ambulates at home. O2 dropped to 92% on 3L.   Stairs            Wheelchair Mobility    Modified Rankin (Stroke Patients Only)       Balance Overall balance assessment: Needs assistance Sitting-balance support: Bilateral upper extremity supported Sitting balance-Leahy Scale: Fair Sitting balance - Comments: No balance deficits readily apparent.    Standing balance support: Bilateral upper extremity supported Standing balance-Leahy Scale: Fair Standing balance comment: Patient is able to ambulate without loss of balance, though he does fatigue quickly. Recommend use of RW until he regains strength/balance.                              Pertinent Vitals/Pain Pain Assessment: No/denies pain    Home Living Family/patient expects to be discharged to:: Private residence Living Arrangements: Alone Available Help at Discharge: Family;Available PRN/intermittently Type of Home: House Home Access: Stairs to enter Entrance Stairs-Rails: Left Entrance Stairs-Number of Steps: 4 Home Layout: One level        Prior Function Level of Independence: Independent;Independent with assistive device(s)  Comments: Patient intermittently uses RW, though reports he typically ambulates independently.      Hand Dominance        Extremity/Trunk Assessment   Upper Extremity Assessment: Overall WFL for tasks assessed           Lower Extremity Assessment: Overall WFL for tasks  assessed         Communication   Communication: No difficulties  Cognition Arousal/Alertness: Awake/alert Behavior During Therapy: WFL for tasks assessed/performed Overall Cognitive Status: Within Functional Limits for tasks assessed                      General Comments      Exercises        Assessment/Plan    PT Assessment Patient needs continued PT services  PT Diagnosis Difficulty walking;Generalized weakness   PT Problem List Decreased strength;Decreased safety awareness;Decreased balance;Cardiopulmonary status limiting activity;Decreased mobility  PT Treatment Interventions Gait training;Stair training;DME instruction;Therapeutic activities;Therapeutic exercise;Balance training   PT Goals (Current goals can be found in the Care Plan section) Acute Rehab PT Goals Patient Stated Goal: To return home safely.  PT Goal Formulation: With patient/family Time For Goal Achievement: 07/05/15 Potential to Achieve Goals: Good    Frequency Min 2X/week   Barriers to discharge Decreased caregiver support Patient lives alone and balked at the idea of needing 24 hour assistance.     Co-evaluation               End of Session Equipment Utilized During Treatment: Gait belt Activity Tolerance: Patient tolerated treatment well;Patient limited by fatigue Patient left: in chair;with call bell/phone within reach;with chair alarm set;with family/visitor present Nurse Communication: Mobility status         Time: 6767-2094 PT Time Calculation (min) (ACUTE ONLY): 15 min   Charges:   PT Evaluation $Initial PT Evaluation Tier I: 1 Procedure     PT G Codes:       Kerman Passey, PT, DPT    06/21/2015, 2:57 PM

## 2015-06-21 NOTE — Progress Notes (Signed)
PULMONARY CONSULT NOTE  Requesting MD/Service: [facility] Date of consult: 12/26 Reason for consultation: Severe COPD, lung cancer   HPI:  55 M with severe COPD/emphysema and lung cancer diagnosed in 11/2014, XRT treatments completed 4-5 weeks ago. He was admitted 12/25 with increased dyspnea, generalized weakness X one day. At baseline, he has class III - IV dyspnea. He has chronic cough. Denies fever, CP, hemoptysis, LE edema. He smoked heavily (2ppd) until approx age 29. No significant occupational exposures.  Past Medical History  Diagnosis Date  . Heart murmur   . COPD (chronic obstructive pulmonary disease) ([place])   . High blood cholesterol level   . Irritable bowel syndrome   . Diabetes mellitus without complication ([place])   . Osteoporosis   . Incontinence   . Paroxysmal a-fib ([place])   . Cancer [facility]) 1994    prostate, then lung  . Pneumonia   . Hypertension   . Chronic kidney disease     KIDNEY STONES  . GERD (gastroesophageal reflux disease)   . On home oxygen therapy     2L [place] PRN    Past Surgical History  Procedure Laterality Date  . Colonoscopy  03/12/13  . Prostate surgery  1994  . Hernia repair  2011  . Surgery for staph infection  1995  . Colonoscopy  2011  . Bone density test      Spine T-spine = -1.7  . Electromagnetic navigation brochoscopy N/A 01/07/2015    Procedure: ELECTROMAGNETIC NAVIGATION BRONCHOSCOPY;  Surgeon: Vilinda Boehringer, MD;  Location: [facility] ORS;  Service: Cardiopulmonary;  Laterality: N/A;    MEDICATIONS: I have reviewed all medications and confirmed regimen as documented  Social History   Social History  . Marital Status: Widowed    Spouse Name: N/A  . Number of Children: N/A  . Years of Education: N/A   Occupational History  . Retired     Environmental consultant   Social History Main Topics  . Smoking status: Former Smoker -- 1.00 packs/day    Types: Cigarettes    Quit date: 05/15/2006  . Smokeless tobacco: Never Used  .  Alcohol Use: No  . Drug Use: No  . Sexual Activity: Not on file   Other Topics Concern  . Not on file   Social History Narrative   Lives at home by himself. No cane or walker at baseline.   Ambulation limited by dyspnea.    Family History  Problem Relation Age of Onset  . Cancer Father     bladder  . Other Sister     brain tumor  . Cancer Brother     colon  . Asthma Mother   . Osteoporosis Mother     ROS: No fever, myalgias/arthralgias, unexplained weight loss or weight gain No new focal weakness or sensory deficits No otalgia, hearing loss, visual changes, nasal and sinus symptoms, mouth and throat problems No neck pain or adenopathy No abdominal pain, N/V/D, diarrhea, change in bowel pattern No dysuria, change in urinary pattern No LE edema or calf tenderness   Filed Vitals:   06/21/15 0747 06/21/15 1419 06/21/15 1435 06/21/15 2033  BP:  117/65    Pulse:  84 87   Temp:  97.6 F (36.4 C)    TempSrc:  Oral    Resp:  20    Height:      Weight:      SpO2: 98% 97% 97% 96%     EXAM:  Gen: frail, elderly, no overt distress HEENT: NCAT,  oropharynx normal Neck: Supple without LAN, thyromegaly, JVD Lungs: breath sounds markedly diminshed, hyperresonant to perc throughout, No wheezes or other adventitious sounds Cardiovascular: Reg rhythm, rate normal, no murmurs noted Abdomen: Soft, nontender, normal BS Ext: without clubbing, cyanosis, edema Neuro: CNs grossly intact, motor and sensory intact, DTRs symmetric Skin: diffuse ecchymoses on all extremities  DATA:   BMP Latest Ref Rng 06/21/2015 06/20/2015 05/24/2015  Glucose 65 - 99 mg/dL 246(H) 166(H) 144(H)  BUN 6 - 20 mg/dL 25(H) 25(H) 20  Creatinine 0.61 - 1.24 mg/dL 0.96 1.18 1.04  Sodium 135 - 145 mmol/L 140 142 136  Potassium 3.5 - 5.1 mmol/L 4.4 4.5 4.0  Chloride 101 - 111 mmol/L 104 101 96(L)  CO2 22 - 32 mmol/L 30 32 32  Calcium 8.9 - 10.3 mg/dL 8.6(L) 9.3 8.8(L)    CBC Latest Ref Rng 06/21/2015  06/20/2015 05/24/2015  WBC 3.8 - 10.6 K/uL 8.6 12.6(H) 8.9  Hemoglobin 13.0 - 18.0 g/dL 13.4 15.0 14.6  Hematocrit 40.0 - 52.0 % 40.3 45.9 44.7  Platelets 150 - 440 K/uL 211 250 258    CXR:  Hyperinflation, scarring in R midlung zone, NAD  IMPRESSION:   Severe emphysema Recent lung cancer - s/p XRT with apparent regression Acute on chronic respiratory failure with hypoxemia AECOPD   PLAN:  Cont systemic steroids Cont supplemental O2 Cont bronchodilator therapy  I will have Dr Mortimer Fries (who follows him in pulmonary clinic) come by to discuss discharge pulmonary regimen  Wilhelmina Mcardle, MD [facility], Critical Care Medicine

## 2015-06-21 NOTE — Plan of Care (Signed)
Problem: Education: Goal: Knowledge of [place] [facility] Education information/materials will improve Outcome: Progressing Completed general medical information. Reviewed education on COPD, PT consult done with pt ambulating with walker and 02 on. Reviewed POC with pt.. Educated on tests/xray's ordered.      Problem: Safety: Goal: Ability to remain free from injury will improve Outcome: Progressing No injuries or safety issues noted this shift. Pt is unsteady requiring 1+ assistance.  Problem: Health Behavior/Discharge Planning: Goal: Ability to manage health-related needs will improve Outcome: Progressing Lives at home;has family support. Needs simple teaching/reminders.   Problem: Pain Managment: Goal: General experience of comfort will improve Outcome: Progressing No co's pain/pain issues reported. Will monitor for new/chronic back pain related to L1 fracture on xray- unknown age.  Problem: Activity: Goal: Risk for activity intolerance will decrease Outcome: Progressing Becomes DOE like when up to [facility] for stool; ask if 02 is on. Resolves with rest. No acute distress. Continue nebs/ MDI's with teaching done.

## 2015-06-21 NOTE — Care Management (Signed)
Admitted to [facility] with the diagnosis of acute on chronic respiratory distress. Lives alone. Daughter is Berdie Ogren. [phone]). Last seen Dr. Mortimer Fries 06/14/15. Sees Dr Caryn Section and a physician at [facility]. Home oxygen thru [facility] since 11/25/14. [facility] services in the home in the past. No skilled facility. Uses a rolling walker to aid in ambulation. Takes care of both basic and instrumental activities of daily living himself, drives. Friends or family will transport. Shelbie Ammons RN MSN CCM Care Management [phone]

## 2015-06-21 NOTE — Care Management Important Message (Signed)
Important Message  Patient Details  Name: HUMZA TALLERICO MRN: [id number] Date of Birth: 04-21-1927   Medicare Important Message Given:  Yes    Shelbie Ammons, RN 06/21/2015, 10:03 AM

## 2015-06-21 NOTE — Progress Notes (Signed)
[place] [facility] [place] NAME: Alan Weaver    MR#:  [id number]  DATE OF BIRTH:  1926-10-11  SUBJECTIVE:  CHIEF COMPLAINT:   Chief Complaint  Patient presents with  . Shortness of Breath   - Patient with COPD on 2 L home oxygen, presented with worsening shortness of breath and also acute low back pain. -Back pain has improved. Currently on 3 L oxygen and breathing is still tight.  REVIEW OF SYSTEMS:  Review of Systems  Constitutional: Negative for fever and chills.  HENT: Negative for ear discharge, ear pain and nosebleeds.   Eyes: Negative for blurred vision.  Respiratory: Positive for cough, shortness of breath and wheezing.   Cardiovascular: Negative for chest pain and palpitations.  Gastrointestinal: Negative for nausea, vomiting, abdominal pain, diarrhea and constipation.  Genitourinary: Negative for dysuria and urgency.  Musculoskeletal: Positive for back pain. Negative for myalgias.  Neurological: Positive for weakness. Negative for dizziness, speech change, focal weakness, seizures and headaches.  Psychiatric/Behavioral: Negative for depression.    DRUG ALLERGIES:  No Known Allergies  VITALS:  Blood pressure 115/62, pulse 94, temperature 97.6 F (36.4 C), temperature source Oral, resp. rate 20, height 6' (1.829 m), weight 74.299 kg (163 lb 12.8 oz), SpO2 98 %.  PHYSICAL EXAMINATION:  Physical Exam  GENERAL:  81 y.o.-year-old patient lying in the bed with no acute distress.  EYES: Pupils equal, round, reactive to light and accommodation. No scleral icterus. Extraocular muscles intact.  HEENT: Head atraumatic, normocephalic. Oropharynx and nasopharynx clear.  NECK:  Supple, no jugular venous distention. No thyroid enlargement, no tenderness.  LUNGS: Moving air in the upper lobes bilaterally. Pretty tight on exam in the lower lobes especially posteriorly. No wheezing. no rales,rhonchi or crepitation. No use of accessory  muscles of respiration.  CARDIOVASCULAR: S1, S2 normal. No  rubs, or gallops. 3/6 systolic murmur is present ABDOMEN: Soft, nontender, nondistended. Bowel sounds present. No organomegaly or mass.  EXTREMITIES: No pedal edema, cyanosis, or clubbing.  NEUROLOGIC: Cranial nerves II through XII are intact. Muscle strength 5/5 in all extremities. Sensation intact. Gait not checked.  PSYCHIATRIC: The patient is alert and oriented x 3.  SKIN: No obvious rash, lesion, or ulcer.    LABORATORY PANEL:   CBC  Recent Labs Lab 06/21/15 0521  WBC 8.6  HGB 13.4  HCT 40.3  PLT 211   ------------------------------------------------------------------------------------------------------------------  Chemistries   Recent Labs Lab 06/21/15 0521  NA 140  K 4.4  CL 104  CO2 30  GLUCOSE 246*  BUN 25*  CREATININE 0.96  CALCIUM 8.6*   ------------------------------------------------------------------------------------------------------------------  Cardiac Enzymes  Recent Labs Lab 06/20/15 2042  TROPONINI <0.03   ------------------------------------------------------------------------------------------------------------------  RADIOLOGY:  Dg Chest Portable 1 View  06/20/2015  CLINICAL DATA:  Lung cancer.  Short of breath. EXAM: PORTABLE CHEST 1 VIEW COMPARISON:  01/07/2015 FINDINGS: COPD with hyperinflation.  There is extensive emphysema on the left. Mass lesion in the right mid lung has improved in the interval. There is some linear density in the right midlung without definite mass lesion on the current study. Negative for heart failure.  Negative for pneumonia or effusion. IMPRESSION: COPD without acute cardiopulmonary abnormality. Spiculated mass right mid long has greatly improved in the interval. Electronically Signed   By: Franchot Gallo M.D.   On: 06/20/2015 21:02    EKG:   Orders placed or performed during the hospital encounter of 06/20/15  . EKG 12-Lead  . EKG 12-Lead  .  EKG 12-Lead  . EKG 12-Lead    ASSESSMENT AND PLAN:   81 year old male with past medical history significant for COPD on 2 L oxygen, diabetes mellitus, osteoporosis, paroxysmal atrial fibrillation, CKD, GERD, diabetes presents to the hospital secondary to worsening shortness of breath and also low back pain.  #1 acute on chronic COPD exacerbation-on IV Solu-Medrol-decreased dose today. -Continue nebulizer treatments. -Continue Spiriva and dulera. -On azithromycin for bronchitis symptoms. -On 3 L oxygen now, wean to 2 L which is his home oxygen. -Physical therapy.  #2 low back pain-due to his frequent use of prednisone, we'll get x-ray to make sure there is no compression fractures. -If negative continue to work with physical therapy and also pain management.  #3 diabetes mellitus-continue metformin. On sliding scale insulin. -Monitor sugars especially while on steroids  #4 paroxysmal atrial fibrillation-in sinus rhythm now. -Rate controlled. On metoprolol. Not on anticoagulation likely from his age.  #5 CKD-stable and at baseline.  #6 DVT prophylaxis-on Lovenox currently  #7 right upper lobe lung nodule-stage I, non-small cell lung cancer on biopsy. Patient had radiation therapy. Repeat PET scan with progression of disease in the nodes. Following up with Dr. Oliva Bustard as outpatient    Physical therapy consult requested.   All the records are reviewed and case discussed with Care Management/Social Workerr. Management plans discussed with the patient, family and they are in agreement.  CODE STATUS: Full Code  TOTAL TIME TAKING CARE OF THIS PATIENT: 37 minutes.   POSSIBLE D/C IN 2 DAYS, DEPENDING ON CLINICAL CONDITION.   Gladstone Lighter M.D on 06/21/2015 at 9:15 AM  Between 7am to 6pm - Pager - [phone]  After 6pm go to [email or web address] - password [email or web address] [facility]  Office  [phone]  CC: Primary care physician; Lelon Huh, MD

## 2015-06-22 ENCOUNTER — Telehealth: Payer: Self-pay

## 2015-06-22 ENCOUNTER — Telehealth: Payer: Self-pay | Admitting: *Deleted

## 2015-06-22 LAB — GLUCOSE, CAPILLARY
GLUCOSE-CAPILLARY: 215 mg/dL — AB (ref 65–99)
GLUCOSE-CAPILLARY: 254 mg/dL — AB (ref 65–99)

## 2015-06-22 MED ORDER — AZITHROMYCIN 250 MG PO TABS: 250.0000 mg | ORAL_TABLET | Freq: Every day | ORAL | Status: DC

## 2015-06-22 MED ORDER — ALBUTEROL SULFATE (2.5 MG/3ML) 0.083% IN NEBU: 3.0000 mL | INHALATION_SOLUTION | RESPIRATORY_TRACT | Status: DC

## 2015-06-22 MED ORDER — TRAMADOL-ACETAMINOPHEN 37.5-325 MG PO TABS: 1.0000 | ORAL_TABLET | Freq: Four times a day (QID) | ORAL | Status: DC | PRN

## 2015-06-22 MED ORDER — PREDNISONE 10 MG (21) PO TBPK: 10.0000 mg | ORAL_TABLET | Freq: Every day | ORAL | Status: DC

## 2015-06-22 MED ORDER — PREDNISONE 20 MG PO TABS: 20.0000 mg | ORAL_TABLET | Freq: Every day | ORAL | Status: DC

## 2015-06-22 NOTE — Telephone Encounter (Signed)
Pt caretaker calling stating that while patient was in hospital they put him on Prednisone and once completed to continue what he was normally taking She would like to know if this is okay and if they should do it that way or not Please advise .

## 2015-06-22 NOTE — Telephone Encounter (Signed)
LMOM for EC or pt to call back.

## 2015-06-22 NOTE — Telephone Encounter (Signed)
patient is been discharged today for acute respiratory failure, he has appt scheduled to f/u on January 6th. Thanks-aa

## 2015-06-22 NOTE — Progress Notes (Signed)
Pt is alert and oriented x 4, denies pain throughout shift, up to bathroom with stand by assist, good appetite, continues on 2 L oxygen, c/o DOE and request usage of albuterol inhaler with activity but inpatient no longer use albuterol inhalers per Pharmacist, pt already  receiving albuterol SVN scheduled, on po antibiotics, pt is d/c to home, no home services needed, RX provided to patient. F/u appt scheduled with pulmonologist and PCP. Pt is dc to home via family, reports understanding of d/c instructions and has no further questions at this time. Vital signs stable, afebrile. Uneventful shift. Pushed to visitor entrance via Engineer, manufacturing.

## 2015-06-22 NOTE — Plan of Care (Signed)
Problem: Safety: Goal: Ability to remain free from injury will improve Outcome: Progressing Pt is alert and oriented, no c/o pain at this time. High Fall Risk, standby assist. Encouraged to call for assistance when needed. On 2 L [place], tolerating well. Receiving scheduled breathing treatments.   Problem: Pain Managment: Goal: General experience of comfort will improve Outcome: Progressing Pt has no c/o pain at this time. Continue to assess.

## 2015-06-22 NOTE — Progress Notes (Signed)
Inpatient Diabetes Program Recommendations  AACE/ADA: New Consensus Statement on Inpatient Glycemic Control (2015)  Target Ranges:  Prepandial:   less than 140 mg/dL      Peak postprandial:   less than 180 mg/dL (1-2 hours)      Critically ill patients:  140 - 180 mg/dL  Results for Alan Weaver, Alan Weaver (MRN [id number]) as of 06/22/2015 10:01  Ref. Range 06/21/2015 07:19 06/21/2015 11:05 06/21/2015 16:40 06/21/2015 21:49 06/22/2015 07:12  Glucose-Capillary Latest Ref Range: 65-99 mg/dL 224 (H) 257 (H) 186 (H) 171 (H) 215 (H)   Review of Glycemic Control  Diabetes history: DM2 Outpatient Diabetes medications: Metformin 500 mg daily Current orders for Inpatient glycemic control: Novolog 0-9 units TID with meals, Novolog 0-5 units HS, Metformin 500 mg QAM  Inpatient Diabetes Program Recommendations: Insulin - Basal: While inpatient and ordered steroids, please consider ordering low dose basal insulin. Recommend starting with Levemir 7 units daily (based on 74 kg x 0.1 units). If basal insulin ordered as requested, please note that once steroids are tapered basal insulin will likely also need to be tapered.   Thanks, Barnie Alderman, RN, MSN, CDE Diabetes Coordinator Inpatient Diabetes Program [phone] (Team Pager from [place] to [place]) [phone] ([facility] office) [phone] [facility] office) [phone] [facility] office)

## 2015-06-22 NOTE — Discharge Summary (Signed)
[facility] [place] NAME: Alan Weaver    MR#:  [id number]  DATE OF BIRTH:  August 01, 1926  DATE OF ADMISSION:  06/20/2015 ADMITTING PHYSICIAN: Lance Coon, MD  DATE OF DISCHARGE: 06/22/15  PRIMARY CARE PHYSICIAN: Lelon Huh, MD    ADMISSION DIAGNOSIS:  Respiratory distress [R06.00] COPD exacerbation (HCC) [J44.1]  DISCHARGE DIAGNOSIS:  Principal Problem:   Acute on chronic respiratory failure with hypoxia (HCC) Active Problems:   COPD exacerbation (HCC)   DM type 2 with diabetic peripheral neuropathy (HCC)   Non-small cell cancer of right lung (HCC)   Paroxysmal a-fib (HCC)   HTN (hypertension)   Back pain   SECONDARY DIAGNOSIS:   Past Medical History  Diagnosis Date  . Heart murmur   . COPD (chronic obstructive pulmonary disease) ([place])   . High blood cholesterol level   . Irritable bowel syndrome   . Diabetes mellitus without complication ([place])   . Osteoporosis   . Incontinence   . Paroxysmal a-fib ([place])   . Cancer [facility]) 1994    prostate, then lung  . Pneumonia   . Hypertension   . Chronic kidney disease     KIDNEY STONES  . GERD (gastroesophageal reflux disease)   . On home oxygen therapy     2L [place] PRN    HOSPITAL COURSE:   81 year old male with past medical history significant for COPD on 2 L oxygen, diabetes mellitus, osteoporosis, paroxysmal atrial fibrillation, CKD, GERD, diabetes presents to the hospital secondary to worsening shortness of breath and also low back pain.  #1 Acute on chronic COPD exacerbation-improving. Change to oral steroids and taper as outpatient -Continue nebulizer treatments. -Continue Spiriva and dulera. -On azithromycin for bronchitis symptoms. -On 2-3 L oxygen which is his home oxygen. .  #2 low back pain- lumbar spine X ray with age indeterminate superior endplate fracture at L1. Patient states he has been moving around very well. No acute fractures noted. Continue  to ambulate. Continue pain management- on low dose tramadol at discharge.  #3 diabetes mellitus-continue metformin.  -Monitor sugars especially while on steroids  #4 Paroxysmal atrial fibrillation-in sinus rhythm now. -Rate controlled. On metoprolol. Not on anticoagulation likely from his age.  #5 CKD-stable and at baseline.  #6 right upper lobe lung nodule-stage I, non-small cell lung cancer on biopsy. Patient had radiation therapy. Repeat PET scan with progression of disease in the nodes. Following up with Dr. Oliva Bustard as outpatient   Discharge home today.  DISCHARGE CONDITIONS:   Guarded  CONSULTS OBTAINED:  Treatment Team:  Wilhelmina Mcardle, MD  DRUG ALLERGIES:  No Known Allergies  DISCHARGE MEDICATIONS:   Current Discharge Medication List    START taking these medications   Details  predniSONE (STERAPRED UNI-PAK 21 TAB) 10 MG (21) TBPK tablet Take 1 tablet (10 mg total) by mouth daily. 6 tabs PO x 1 day 5 tabs PO x 1 day 4 tabs PO x 1 day 3 tabs PO x 1 day 2 tabs PO x 1 day and stop Qty: 20 tablet, Refills: 0    traMADol-acetaminophen (ULTRACET) 37.5-325 MG tablet Take 1 tablet by mouth every 6 (six) hours as needed. Qty: 25 tablet, Refills: 0      CONTINUE these medications which have CHANGED   Details  azithromycin (ZITHROMAX) 250 MG tablet Take 1 tablet (250 mg total) by mouth daily. Qty: 4 each, Refills: 0    predniSONE (DELTASONE) 20 MG tablet Take 1 tablet (20  mg total) by mouth daily with breakfast. PLEASE START THIS BACK AFTER THE PREDNISONE TAPER IS FINISHED Qty: 30 tablet, Refills: 0      CONTINUE these medications which have NOT CHANGED   Details  albuterol (PROVENTIL HFA;VENTOLIN HFA) 108 (90 BASE) MCG/ACT inhaler Inhale 2 puffs into the lungs every 6 (six) hours as needed for wheezing or shortness of breath.     alendronate (FOSAMAX) 70 MG tablet Take 70 mg by mouth once a week. Pt takes on Sunday.    Take with a full glass of water on an empty  stomach.    amLODipine (NORVASC) 5 MG tablet Take 5 mg by mouth daily.    aspirin EC 81 MG tablet Take 162 mg by mouth every 4 (four) hours as needed for mild pain.     Calcium Carbonate-Vitamin D (CALCIUM 600+D) 600-400 MG-UNIT tablet Take 1 tablet by mouth 2 (two) times daily.    cholecalciferol (VITAMIN D) 1000 UNITS tablet Take 2,000 Units by mouth daily.     Cranberry-Vitamin C-Probiotic (AZO CRANBERRY) 250-30 MG TABS Take 2 tablets by mouth every evening.    Fluticasone Furoate-Vilanterol 100-25 MCG/INH AEPB Inhale 1 puff into the lungs daily. Qty: 60 each, Refills: 3    ipratropium-albuterol (DUONEB) 0.5-2.5 (3) MG/3ML SOLN Take 3 mLs by nebulization 4 (four) times daily. Qty: 360 mL, Refills: 1    Leuprolide Acetate (LUPRON IJ) Inject 1 Dose as directed every 3 (three) months.     metFORMIN (GLUCOPHAGE) 500 MG tablet Take 500 mg by mouth daily.    metoprolol (LOPRESSOR) 50 MG tablet Take 1 tablet (50 mg total) by mouth 2 (two) times daily. Qty: 60 tablet, Refills: 1    Omega-3 Fatty Acids (FISH OIL) 1000 MG CAPS Take 2,000 mg by mouth daily.     pravastatin (PRAVACHOL) 40 MG tablet Take 40 mg by mouth daily.     Umeclidinium Bromide (INCRUSE ELLIPTA) 62.5 MCG/INH AEPB Inhale 1 puff into the lungs daily. Qty: 30 each, Refills: 5         DISCHARGE INSTRUCTIONS:   1. PCP f/u in 1-2 weeks 2. Pulmonary follow up in 2 weeks  If you experience worsening of your admission symptoms, develop shortness of breath, life threatening emergency, suicidal or homicidal thoughts you must seek medical attention immediately by calling 911 or calling your MD immediately  if symptoms less severe.  You Must read complete instructions/literature along with all the possible adverse reactions/side effects for all the Medicines you take and that have been prescribed to you. Take any new Medicines after you have completely understood and accept all the possible adverse reactions/side effects.    Please note  You were cared for by a hospitalist during your hospital stay. If you have any questions about your discharge medications or the care you received while you were in the hospital after you are discharged, you can call the unit and asked to speak with the hospitalist on call if the hospitalist that took care of you is not available. Once you are discharged, your primary care physician will handle any further medical issues. Please note that NO REFILLS for any discharge medications will be authorized once you are discharged, as it is imperative that you return to your primary care physician (or establish a relationship with a primary care physician if you do not have one) for your aftercare needs so that they can reassess your need for medications and monitor your lab values.    Today  CHIEF COMPLAINT:   Chief Complaint  Patient presents with  . Shortness of Breath    VITAL SIGNS:  Blood pressure 110/62, pulse 75, temperature 97.4 F (36.3 C), temperature source Oral, resp. rate 19, height 6' (1.829 m), weight 74.299 kg (163 lb 12.8 oz), SpO2 99 %.  I/O:   Intake/Output Summary (Last 24 hours) at 06/22/15 1408 Last data filed at 06/22/15 1300  Gross per 24 hour  Intake    720 ml  Output      1 ml  Net    719 ml    PHYSICAL EXAMINATION:   Physical Exam  GENERAL: 81 y.o.-year-old patient lying in the bed with no acute distress.  EYES: Pupils equal, round, reactive to light and accommodation. No scleral icterus. Extraocular muscles intact.  HEENT: Head atraumatic, normocephalic. Oropharynx and nasopharynx clear.  NECK: Supple, no jugular venous distention. No thyroid enlargement, no tenderness.  LUNGS: Moving air in the upper lobes bilaterally.improved air movement in the lower lobes, though decreased. No wheezing. no rales,rhonchi or crepitation. No use of accessory muscles of respiration.  CARDIOVASCULAR: S1, S2 normal. No rubs, or gallops. 3/6 systolic  murmur is present ABDOMEN: Soft, nontender, nondistended. Bowel sounds present. No organomegaly or mass.  EXTREMITIES: No pedal edema, cyanosis, or clubbing.  NEUROLOGIC: Cranial nerves II through XII are intact. Muscle strength 5/5 in all extremities. Sensation intact. Gait not checked.  PSYCHIATRIC: The patient is alert and oriented x 3.  SKIN: No obvious rash, lesion, or ulcer.   DATA REVIEW:   CBC  Recent Labs Lab 06/21/15 0521  WBC 8.6  HGB 13.4  HCT 40.3  PLT 211    Chemistries   Recent Labs Lab 06/21/15 0521  NA 140  K 4.4  CL 104  CO2 30  GLUCOSE 246*  BUN 25*  CREATININE 0.96  CALCIUM 8.6*    Cardiac Enzymes  Recent Labs Lab 06/20/15 2042  TROPONINI <0.03    Microbiology Results  Results for orders placed or performed during the hospital encounter of 06/20/15  Blood culture (routine x 2)     Status: None (Preliminary result)   Collection Time: 06/20/15  8:42 PM  Result Value Ref Range Status   Specimen Description BLOOD RIGHT HAND  Final   Special Requests   Final    BOTTLES DRAWN AEROBIC AND ANAEROBIC  3CC AERO, 3CC ANAERO   Culture NO GROWTH < 24 HOURS  Final   Report Status PENDING  Incomplete  Blood culture (routine x 2)     Status: None (Preliminary result)   Collection Time: 06/20/15  8:42 PM  Result Value Ref Range Status   Specimen Description BLOOD LEFT ASSIST CONTROL  Final   Special Requests   Final    BOTTLES DRAWN AEROBIC AND ANAEROBIC  4CC AERO, 3CC ANAERO   Culture NO GROWTH < 24 HOURS  Final   Report Status PENDING  Incomplete    RADIOLOGY:  Dg Lumbar Spine 2-3 Views  06/21/2015  CLINICAL DATA:  Body aches. Non radiating lumbar pain for 2 days. Personal history of prostate and lung cancer. Osteoporosis. Back pain. EXAM: LUMBAR SPINE - 2-3 VIEW COMPARISON:  PET scan 01/06/2015. FINDINGS: Five non rib-bearing lumbar type vertebral bodies are present. A superior endplate compression fracture at L1 is age indeterminate.  Although this does not appear acute, acute fracture is not excluded. Vertebral body heights and alignment are otherwise maintained. Moderate osteopenia is present. There is chronic loss of disc height at L5-S1 without significant  change. Atherosclerotic calcifications are present in the aorta and branch vessels. IMPRESSION: 1. Age indeterminate superior endplate fracture at L1 was not present on the previous CT scan in July. 2. Chronic loss of disc height at L5-S1. 3. Osteopenia. 4. Atherosclerosis. Electronically Signed   By: San Morelle M.D.   On: 06/21/2015 11:52   Dg Chest Portable 1 View  06/20/2015  CLINICAL DATA:  Lung cancer.  Short of breath. EXAM: PORTABLE CHEST 1 VIEW COMPARISON:  01/07/2015 FINDINGS: COPD with hyperinflation.  There is extensive emphysema on the left. Mass lesion in the right mid lung has improved in the interval. There is some linear density in the right midlung without definite mass lesion on the current study. Negative for heart failure.  Negative for pneumonia or effusion. IMPRESSION: COPD without acute cardiopulmonary abnormality. Spiculated mass right mid long has greatly improved in the interval. Electronically Signed   By: Franchot Gallo M.D.   On: 06/20/2015 21:02    EKG:   Orders placed or performed during the hospital encounter of 06/20/15  . EKG 12-Lead  . EKG 12-Lead  . EKG 12-Lead  . EKG 12-Lead      Management plans discussed with the patient, family and they are in agreement.  CODE STATUS:     Code Status Orders        Start     Ordered   06/21/15 0023  Full code   Continuous     06/21/15 0022      TOTAL TIME TAKING CARE OF THIS PATIENT: 36 minutes.    Gladstone Lighter M.D on 06/22/2015 at 2:08 PM  Between 7am to 6pm - Pager - [phone]  After 6pm go to [email or web address] - password [email or web address] [facility]  Office  [phone]  CC: Primary care physician; Lelon Huh, MD

## 2015-06-23 ENCOUNTER — Encounter: Payer: Self-pay | Admitting: *Deleted

## 2015-06-23 NOTE — Telephone Encounter (Signed)
*  STAT* If patient is at the pharmacy, call can be transferred to refill team.   1. Which medications need to be refilled? (please list name of each medication and dose if known)   2. Which pharmacy/location (including street and city if local pharmacy) is medication to be sent to  3. Do they need a 30 day or 90 day supply?   This encounter was created in error - please disregard.

## 2015-06-23 NOTE — Telephone Encounter (Signed)
Pt informed to finish the higher dose of prednisone he was given at the hospital and then go back down to his normal dose he has been on. Nothing further needed.

## 2015-06-26 LAB — CULTURE, BLOOD (ROUTINE X 2)
CULTURE: NO GROWTH
CULTURE: NO GROWTH

## 2015-06-29 ENCOUNTER — Other Ambulatory Visit: Payer: Self-pay | Admitting: Family Medicine

## 2015-06-29 ENCOUNTER — Ambulatory Visit (INDEPENDENT_AMBULATORY_CARE_PROVIDER_SITE_OTHER): Payer: PPO | Admitting: Family Medicine

## 2015-06-29 ENCOUNTER — Encounter: Payer: Self-pay | Admitting: Family Medicine

## 2015-06-29 VITALS — BP 92/60 | HR 76 | Temp 97.6°F | Resp 20 | Ht 72.0 in | Wt 162.0 lb

## 2015-06-29 DIAGNOSIS — J441 Chronic obstructive pulmonary disease with (acute) exacerbation: Secondary | ICD-10-CM

## 2015-06-29 DIAGNOSIS — Z23 Encounter for immunization: Secondary | ICD-10-CM

## 2015-06-29 DIAGNOSIS — C3491 Malignant neoplasm of unspecified part of right bronchus or lung: Secondary | ICD-10-CM | POA: Diagnosis not present

## 2015-06-29 NOTE — Progress Notes (Signed)
Patient: Alan Weaver Male    DOB: 1926-08-26   82 y.o.   MRN: [id number] Visit Date: 06/29/2015  Today's Provider: Lelon Huh, MD   Chief Complaint  Patient presents with  . Hospitalization Follow-up  . Respiratory Distress    respiratory failure follow up   Subjective:    HPI   Follow up Hospitalization  Patient was admitted to [facility]  on 06/20/2015 and discharged on 06/22/2015. He was treated for Acute on  Chronic respiratory failure with hypoxia.  . Treatment for this included stating oral steroids and taper. Upon discharge patient was advised to continue Nebulizer treatments, Spiriva, Dulera and Azithromycin for Bronchitis symptoms. Patient was discharged on 2-3 Liters of oxygen and was advised to follow up with his  PCP in 1-2 weeks and his Pulmonologist in 2 weeks.  He reports good compliance with treatment. He reports this condition is somewhat improved but not 100% better. Patient has an appointment to see Dr. Mortimer Fries (Pulmonologist) 07/12/2015. Patient states he has been taking all of the antibiotic (Zpack) and uses the nebulizer 3-4 times a day. He has weaned down to his maintenance dose of prednisone and feels he is back at his baseline.   ------------------------------------------------------------------------------------      No Known Allergies Previous Medications   ALBUTEROL (PROVENTIL HFA;VENTOLIN HFA) 108 (90 BASE) MCG/ACT INHALER    Inhale 2 puffs into the lungs every 6 (six) hours as needed for wheezing or shortness of breath.    ALENDRONATE (FOSAMAX) 70 MG TABLET    Take 70 mg by mouth once a week. Pt takes on Sunday.    Take with a full glass of water on an empty stomach.   AMLODIPINE (NORVASC) 5 MG TABLET    Take 5 mg by mouth daily.   ASPIRIN EC 81 MG TABLET    Take 162 mg by mouth every 4 (four) hours as needed for mild pain.    AZITHROMYCIN (ZITHROMAX) 250 MG TABLET    Take 1 tablet (250 mg total) by mouth daily.   CALCIUM CARBONATE-VITAMIN D  (CALCIUM 600+D) 600-400 MG-UNIT TABLET    Take 1 tablet by mouth 2 (two) times daily.   CHOLECALCIFEROL (VITAMIN D) 1000 UNITS TABLET    Take 2,000 Units by mouth daily.    CRANBERRY-VITAMIN C-PROBIOTIC (AZO CRANBERRY) 250-30 MG TABS    Take 2 tablets by mouth every evening.   FLUTICASONE FUROATE-VILANTEROL 100-25 MCG/INH AEPB    Inhale 1 puff into the lungs daily.   IPRATROPIUM-ALBUTEROL (DUONEB) 0.5-2.5 (3) MG/3ML SOLN    Take 3 mLs by nebulization 4 (four) times daily.   LEUPROLIDE ACETATE (LUPRON IJ)    Inject 1 Dose as directed every 3 (three) months.    METFORMIN (GLUCOPHAGE) 500 MG TABLET    Take 500 mg by mouth daily.   METOPROLOL (LOPRESSOR) 50 MG TABLET    Take 1 tablet (50 mg total) by mouth 2 (two) times daily.   OMEGA-3 FATTY ACIDS (FISH OIL) 1000 MG CAPS    Take 2,000 mg by mouth daily.    PRAVASTATIN (PRAVACHOL) 40 MG TABLET    Take 40 mg by mouth daily.    PREDNISONE (DELTASONE) 20 MG TABLET    Take 1 tablet (20 mg total) by mouth daily with breakfast. PLEASE START THIS BACK AFTER THE PREDNISONE TAPER IS FINISHED   PREDNISONE (STERAPRED UNI-PAK 21 TAB) 10 MG (21) TBPK TABLET    Take 1 tablet (10 mg total) by mouth daily. 6  tabs PO x 1 day 5 tabs PO x 1 day 4 tabs PO x 1 day 3 tabs PO x 1 day 2 tabs PO x 1 day and stop   TRAMADOL-ACETAMINOPHEN (ULTRACET) 37.5-325 MG TABLET    Take 1 tablet by mouth every 6 (six) hours as needed.   UMECLIDINIUM BROMIDE (INCRUSE ELLIPTA) 62.5 MCG/INH AEPB    Inhale 1 puff into the lungs daily.    Review of Systems  Constitutional: Positive for fatigue. Negative for fever, chills and appetite change.  Respiratory: Positive for cough and shortness of breath. Negative for chest tightness and wheezing.   Cardiovascular: Negative for chest pain, palpitations and leg swelling.  Gastrointestinal: Negative for nausea, vomiting and abdominal pain.  Neurological: Positive for weakness.    Social History  Substance Use Topics  . Smoking status: Former  Smoker -- 1.00 packs/day    Types: Cigarettes    Quit date: 05/15/2006  . Smokeless tobacco: Never Used  . Alcohol Use: No   Objective:   BP 92/60 mmHg  Pulse 76  Temp(Src) 97.6 F (36.4 C) (Oral)  Resp 20  Ht 6' (1.829 m)  Wt 162 lb (73.483 kg)  BMI 21.97 kg/m2  SpO2 92% (2lpm O@)  Physical Exam   General Appearance:    Alert, cooperative, no distress  Eyes:    PERRL, conjunctiva/corneas clear, EOM's intact       Lungs:     Diminished breath sounds, rare expiratory wheezes.   Heart:    Regular rate and rhythm  Neurologic:   Awake, alert, oriented x 3. No apparent focal neurological           defect.       Lab Results  Component Value Date   HGBA1C 7.0* 06/20/2015        Assessment & Plan:     1. COPD exacerbation ([place]) Greatly improved since discharge, back on maintenance dose of prednisone - Pneumococcal polysaccharide vaccine 23-valent greater than or equal to 2yo subcutaneous/IM  2. Need for pneumococcal vaccination  - Pneumococcal polysaccharide vaccine 23-valent greater than or equal to 2yo subcutaneous/IM  3. Non-small cell cancer of right lung (HCC) No records of Pneumovax which was probably given at age 42. Consider high risk he should have another. Is UTD on Prevnar-13 - Pneumococcal polysaccharide vaccine 23-valent greater than or equal to 2yo subcutaneous/IM        Lelon Huh, MD  [facility]

## 2015-06-30 ENCOUNTER — Other Ambulatory Visit: Payer: Self-pay | Admitting: Family Medicine

## 2015-07-01 ENCOUNTER — Other Ambulatory Visit: Payer: Self-pay | Admitting: Family Medicine

## 2015-07-01 DIAGNOSIS — J432 Centrilobular emphysema: Secondary | ICD-10-CM

## 2015-07-01 DIAGNOSIS — J9621 Acute and chronic respiratory failure with hypoxia: Secondary | ICD-10-CM

## 2015-07-01 DIAGNOSIS — C3491 Malignant neoplasm of unspecified part of right bronchus or lung: Secondary | ICD-10-CM

## 2015-07-01 MED ORDER — IPRATROPIUM-ALBUTEROL 0.5-2.5 (3) MG/3ML IN SOLN: RESPIRATORY_TRACT | Status: AC

## 2015-07-12 ENCOUNTER — Ambulatory Visit (INDEPENDENT_AMBULATORY_CARE_PROVIDER_SITE_OTHER): Payer: PPO | Admitting: Internal Medicine

## 2015-07-12 ENCOUNTER — Encounter: Payer: Self-pay | Admitting: Internal Medicine

## 2015-07-12 VITALS — BP 128/62 | HR 82 | Ht 72.0 in | Wt 162.0 lb

## 2015-07-12 DIAGNOSIS — R0602 Shortness of breath: Secondary | ICD-10-CM

## 2015-07-12 NOTE — Progress Notes (Signed)
MRN# [id number] Alan Weaver 06-Sep-1926   CC: Worsening SOB, DOE, follow up COPD, lung cancer   HPI:  Patient with COPD-DOE and SOB with exertion worsened over past couple of weeks Has been on prednisone for last 4 weeks 20 mg daily Started on Breo and Incruse  Completed SBRT on 02/22/15 for RUL mass. gradual increase with DOE. Uses oxygen with exertion No signs of infection at this time Last ECHO in may 2016 showed grade 1 diastolic dysfunction  Review of Systems: Gen:  Denies  fever, sweats, chills HEENT: Denies blurred vision, double vision, ear pain, eye pain, hearing loss, nose bleeds, sore throat Cvc:  No dizziness, chest pain or heaviness Resp:   Admits BT:DHRCBUL sob and sputum production, DOE Ext:   No Joint pain, stiffness or swelling Other:  All other systems negative  Allergies:  Review of patient's allergies indicates no known allergies.  Physical Examination:  VS: BP 128/62 mmHg  Pulse 82  Ht 6' (1.829 m)  Wt 162 lb (73.483 kg)  BMI 21.97 kg/m2  SpO2 93%  General Appearance: No distress  HEENT: PERRLA, no ptosis, no other lesions noticed, pursed lip breathing Pulmonary:distant  breath sounds., diaphragmatic excursion normal.No wheezing, No rales , mild dec in BS at the bases.  Cardiovascular:  Normal S1,S2.  No m/r/g.     Abdomen:Exam: Benign, Soft, non-tender, No masses  Skin:   warm, no rashes, no ecchymosis  Extremities: normal, no cyanosis, clubbing, warm with normal capillary refill.     Biopsy result from transbronch forcepts 01/07/15 DIAGNOSIS:  A. LUNG, RIGHT UPPER LOBE; ENB FORCEPS BIOPSY:  - NON-SMALL CELL CARCINOMA, FAVOR SQUAMOUS CELL CARCINOMA.   Note:  A cell block was included in the interpretation of this case. Specimen  reviewed in conjunction with ARC-[id number] with IHC results.    CT chest on 06/21/15 images reveiwed 07/12/2015   Assessment and Plan:82 yo with COPD end stage with chronic resp insufficiency, s\p bronch with biopsy for  RUL mass, found to be NSCLC Patient with chronic worsening of SOB/DOE despite being on Spiriva and Advair and Prednisone. Patient with severe end stage COPD and resp insufficiency  1.Breo and Incruse 2.continue oxygen as prescribed and as needed 3.cont azithromycin 4.continue dounebs as prescribed 5.continue prednisone as prescribed 6.check ECHO and assess interval changes-will likely need to see cardiology again 7.check overnight pulse oximetry 8.follow up oncology as scheduled  Follow up in 1 month after tests completed  Prognosis is  poor, poor quality of life with resp status  I have personally obtained a history, examined the patient, evaluated Pertinent laboratory and RadioGraphic/imaging results, and  formulated the assessment and plan The Patient requires high complexity decision making for assessment and support, frequent evaluation and titration of therapies. Patient/Family are satisfied with Plan of action and management. All questions answered  Corrin Parker, M.D.  [facility]  Medical Director [place] Director [facility] Cardio-Pulmonary Department

## 2015-07-12 NOTE — Patient Instructions (Signed)
Chronic Obstructive Pulmonary Disease Chronic obstructive pulmonary disease (COPD) is a common lung condition in which airflow from the lungs is limited. COPD is a general term that can be used to describe many different lung problems that limit airflow, including both chronic bronchitis and emphysema. If you have COPD, your lung function will probably never return to normal, but there are measures you can take to improve lung function and make yourself feel better. CAUSES   Smoking (common).  Exposure to secondhand smoke.  Genetic problems.  Chronic inflammatory lung diseases or recurrent infections. SYMPTOMS  Shortness of breath, especially with physical activity.  Deep, persistent (chronic) cough with a large amount of thick mucus.  Wheezing.  Rapid breaths (tachypnea).  Gray or bluish discoloration (cyanosis) of the skin, especially in your fingers, toes, or lips.  Fatigue.  Weight loss.  Frequent infections or episodes when breathing symptoms become much worse (exacerbations).  Chest tightness. DIAGNOSIS Your health care provider will take a medical history and perform a physical examination to diagnose COPD. Additional tests for COPD may include:  Lung (pulmonary) function tests.  Chest X-ray.  CT scan.  Blood tests. TREATMENT  Treatment for COPD may include:  Inhaler and nebulizer medicines. These help manage the symptoms of COPD and make your breathing more comfortable.  Supplemental oxygen. Supplemental oxygen is only helpful if you have a low oxygen level in your blood.  Exercise and physical activity. These are beneficial for nearly all people with COPD.  Lung surgery or transplant.  Nutrition therapy to gain weight, if you are underweight.  Pulmonary rehabilitation. This may involve working with a team of health care providers and specialists, such as respiratory, occupational, and physical therapists. HOME CARE INSTRUCTIONS  Take all medicines  (inhaled or pills) as directed by your health care provider.  Avoid over-the-counter medicines or cough syrups that dry up your airway (such as antihistamines) and slow down the elimination of secretions unless instructed otherwise by your health care provider.  If you are a smoker, the most important thing that you can do is stop smoking. Continuing to smoke will cause further lung damage and breathing trouble. Ask your health care provider for help with quitting smoking. He or she can direct you to community resources or hospitals that provide support.  Avoid exposure to irritants such as smoke, chemicals, and fumes that aggravate your breathing.  Use oxygen therapy and pulmonary rehabilitation if directed by your health care provider. If you require home oxygen therapy, ask your health care provider whether you should purchase a pulse oximeter to measure your oxygen level at home.  Avoid contact with individuals who have a contagious illness.  Avoid extreme temperature and humidity changes.  Eat healthy foods. Eating smaller, more frequent meals and resting before meals may help you maintain your strength.  Stay active, but balance activity with periods of rest. Exercise and physical activity will help you maintain your ability to do things you want to do.  Preventing infection and hospitalization is very important when you have COPD. Make sure to receive all the vaccines your health care provider recommends, especially the pneumococcal and influenza vaccines. Ask your health care provider whether you need a pneumonia vaccine.  Learn and use relaxation techniques to manage stress.  Learn and use controlled breathing techniques as directed by your health care provider. Controlled breathing techniques include:  Pursed lip breathing. Start by breathing in (inhaling) through your nose for 1 second. Then, purse your lips as if you were   going to whistle and breathe out (exhale) through the  pursed lips for 2 seconds.  Diaphragmatic breathing. Start by putting one hand on your abdomen just above your waist. Inhale slowly through your nose. The hand on your abdomen should move out. Then purse your lips and exhale slowly. You should be able to feel the hand on your abdomen moving in as you exhale.  Learn and use controlled coughing to clear mucus from your lungs. Controlled coughing is a series of short, progressive coughs. The steps of controlled coughing are: 1. Lean your head slightly forward. 2. Breathe in deeply using diaphragmatic breathing. 3. Try to hold your breath for 3 seconds. 4. Keep your mouth slightly open while coughing twice. 5. Spit any mucus out into a tissue. 6. Rest and repeat the steps once or twice as needed. SEEK MEDICAL CARE IF:  You are coughing up more mucus than usual.  There is a change in the color or thickness of your mucus.  Your breathing is more labored than usual.  Your breathing is faster than usual. SEEK IMMEDIATE MEDICAL CARE IF:  You have shortness of breath while you are resting.  You have shortness of breath that prevents you from:  Being able to talk.  Performing your usual physical activities.  You have chest pain lasting longer than 5 minutes.  Your skin color is more cyanotic than usual.  You measure low oxygen saturations for longer than 5 minutes with a pulse oximeter. MAKE SURE YOU:  Understand these instructions.  Will watch your condition.  Will get help right away if you are not doing well or get worse.   This information is not intended to replace advice given to you by your health care provider. Make sure you discuss any questions you have with your health care provider.   Document Released: 03/22/2005 Document Revised: 07/03/2014 Document Reviewed: 02/06/2013 Elsevier Interactive Patient Education 2016 [employer] Inc[employer]  

## 2015-07-14 ENCOUNTER — Ambulatory Visit (INDEPENDENT_AMBULATORY_CARE_PROVIDER_SITE_OTHER): Payer: PPO

## 2015-07-14 ENCOUNTER — Other Ambulatory Visit: Payer: Self-pay

## 2015-07-14 DIAGNOSIS — R0602 Shortness of breath: Secondary | ICD-10-CM

## 2015-07-15 ENCOUNTER — Encounter: Payer: Self-pay | Admitting: Internal Medicine

## 2015-07-21 ENCOUNTER — Other Ambulatory Visit: Payer: Self-pay | Admitting: Internal Medicine

## 2015-07-23 ENCOUNTER — Telehealth: Payer: Self-pay | Admitting: *Deleted

## 2015-07-23 MED ORDER — PREDNISONE 20 MG PO TABS: 20.0000 mg | ORAL_TABLET | Freq: Every day | ORAL | Status: DC

## 2015-07-23 NOTE — Telephone Encounter (Signed)
*  STAT* If patient is at the pharmacy, call can be transferred to refill team.   1. Which medications need to be refilled? (please list name of each medication and dose if known)  predniSONE (DELTASONE) 20 MG tablet   2. Which pharmacy/location (including street and city if local pharmacy) is medication to be sent to? [facility]  3. Do they need a 30 day or 90 day supply? 30 day

## 2015-08-02 ENCOUNTER — Ambulatory Visit
Admission: RE | Admit: 2015-08-02 | Discharge: 2015-08-02 | Disposition: A | Discharge status: Home / Self Care | Payer: PPO | Source: Ambulatory Visit | Attending: Radiation Oncology | Admitting: Radiation Oncology

## 2015-08-02 ENCOUNTER — Other Ambulatory Visit: Payer: Self-pay | Admitting: *Deleted

## 2015-08-02 ENCOUNTER — Encounter: Payer: Self-pay | Admitting: Radiation Oncology

## 2015-08-02 VITALS — BP 113/65 | HR 77 | Temp 96.3°F | Resp 18 | Wt 162.9 lb

## 2015-08-02 DIAGNOSIS — C3411 Malignant neoplasm of upper lobe, right bronchus or lung: Secondary | ICD-10-CM

## 2015-08-02 NOTE — Progress Notes (Signed)
Radiation Oncology Follow up Note  Name: Alan Weaver   Date:   08/02/2015 MRN:  [id number] DOB: [birth year]    This 82 y.o. male presents to the clinic today for follow-up for SB RT to right upper lobe for stage I non-small cell lung cancer.  REFERRING PROVIDER: Birdie Sons, MD  HPI: Patient is an 82 year old male now out 5 months having completed SB RT to his right upper lobe for a stage I non-small cell lung cancer. He is seen today in routine follow-up is doing fairly well still having significant problems with breathing he is on nasal oxygen continuously. He has multiple comorbidities including prostate cancer status post prostatectomy and salvage radiation. Also has a history of cardiac arrhythmia. He is seen today in routine follow-up is doing fairly well had a CT scan back in November 2016 showing interval decrease in size of the spiculated mass in the right upper lobe. Patient has mild cough nonproductive no overall pain or dysphagia.  COMPLICATIONS OF TREATMENT: none  FOLLOW UP COMPLIANCE: keeps appointments   PHYSICAL EXAM:  BP 113/65 mmHg  Pulse 77  Temp(Src) 96.3 F (35.7 C)  Resp 18  Wt 162 lb 14.7 oz (73.9 kg) Elderly nasal oxygen dependent male wheelchair-bound in NAD. Well-developed well-nourished patient in NAD. HEENT reveals PERLA, EOMI, discs not visualized.  Oral cavity is clear. No oral mucosal lesions are identified. Neck is clear without evidence of cervical or supraclavicular adenopathy. Lungs are clear to A&P. Cardiac examination is essentially unremarkable with regular rate and rhythm without murmur rub or thrill. Abdomen is benign with no organomegaly or masses noted. Motor sensory and DTR levels are equal and symmetric in the upper and lower extremities. Cranial nerves II through XII are grossly intact. Proprioception is intact. No peripheral adenopathy or edema is identified. No motor or sensory levels are noted. Crude visual fields are within normal  range.  RADIOLOGY RESULTS: Prior CT scan is reviewed compatible with the above-stated findings.  PLAN: From a pulmonary standpoint he continues to be stable with good response to his right upper lobe non-small cell lung cancer. From our standpoint please was overall progress. I have asked to see him back in 6 months for follow-up and ordered a CT scan prior to that visit. He continues follow-up care with medical oncology and they are tracking and managing his prostate cancer. Patient is to call sooner with any concerns.  I would like to take this opportunity for allowing me to participate in the care of your patient.Armstead Peaks., MD

## 2015-08-12 ENCOUNTER — Ambulatory Visit (INDEPENDENT_AMBULATORY_CARE_PROVIDER_SITE_OTHER): Payer: PPO | Admitting: Internal Medicine

## 2015-08-12 ENCOUNTER — Encounter: Payer: Self-pay | Admitting: Internal Medicine

## 2015-08-12 VITALS — BP 124/72 | HR 103 | Ht 72.0 in | Wt 163.4 lb

## 2015-08-12 DIAGNOSIS — I5031 Acute diastolic (congestive) heart failure: Secondary | ICD-10-CM

## 2015-08-12 NOTE — Progress Notes (Signed)
MRN# [id number] JSON KOELZER 1927-01-01   CC: Worsening SOB, DOE, follow up COPD, lung cancer   HPI:  Patient with COPD-DOE and SOB with exertion worsened over past couple of weeks Currently on  prednisone for last 8 weeks 20 mg daily Started on Breo and Incruse  Completed SBRT on 02/22/15 for RUL mass. Still with persistent gradual increase with DOE. Uses oxygen with exertion No signs of infection at this time, ONO reveals  he needs oxygen at night Last ECHO in may 2016 showed grade 1 diastolic dysfunction, current January 2017 ECHO shows EF 50% and grade 1 Diastolic dys  Review of Systems: Gen:  Denies  fever, sweats, chills HEENT: Denies blurred vision, double vision, ear pain, eye pain, hearing loss, nose bleeds, sore throat Cvc:  No dizziness, chest pain or heaviness Resp:   Admits OV:ANVBTYO sob and sputum production, DOE Ext:   No Joint pain, stiffness or swelling Other:  All other systems negative  Allergies:  Review of patient's allergies indicates no known allergies.  Physical Examination:  VS: BP 124/72 mmHg  Pulse 103  Ht 6' (1.829 m)  Wt 163 lb 6.4 oz (74.118 kg)  BMI 22.16 kg/m2  SpO2 93%  General Appearance: mild distress  HEENT: PERRLA, no ptosis, no other lesions noticed, pursed lip breathing Pulmonary:distant  breath sounds., diaphragmatic excursion normal.No wheezing, No rales , mild dec in BS at the bases.  Cardiovascular:  Normal S1,S2.  No m/r/g.     Abdomen:Exam: Benign, Soft, non-tender, No masses  Skin:   warm, no rashes, no ecchymosis  Extremities: normal, no cyanosis, +edema  Biopsy result from transbronch forcepts 01/07/15 DIAGNOSIS:  A. LUNG, RIGHT UPPER LOBE; ENB FORCEPS BIOPSY:  - NON-SMALL CELL CARCINOMA, FAVOR SQUAMOUS CELL CARCINOMA.   Note:  A cell block was included in the interpretation of this case. Specimen  reviewed in conjunction with ARC-133 with IHC results.    Assessment and Plan:82 yo with COPD end stage with chronic  resp insufficiency, s\p bronch with biopsy for RUL mass, found to be NSCLC Patient with chronic worsening of SOB/DOE. Patient with severe end stage COPD and resp insufficiency  1.will change back to Advair and Spiriva 2.continue oxygen as prescribed and as needed 3.cont azithromycin 4.continue dounebs as prescribed 5.continue prednisone as prescribed 6.cardiology referral needed   Follow up in 1 month   Prognosis is  poor, poor quality of life with resp status. I have discussed end of life issues regarding DNR status, will will follow up with PCP and address at that time   The Patient requires high complexity decision making for assessment and support, frequent evaluation and titration of therapies. Patient/Family are satisfied with Plan of action and management. All questions answered  Corrin Parker, M.D.  [facility]  Medical Director [place] Director [facility] Cardio-Pulmonary Department

## 2015-08-12 NOTE — Patient Instructions (Signed)
Chronic Obstructive Pulmonary Disease Chronic obstructive pulmonary disease (COPD) is a common lung condition in which airflow from the lungs is limited. COPD is a general term that can be used to describe many different lung problems that limit airflow, including both chronic bronchitis and emphysema. If you have COPD, your lung function will probably never return to normal, but there are measures you can take to improve lung function and make yourself feel better. CAUSES   Smoking (common).  Exposure to secondhand smoke.  Genetic problems.  Chronic inflammatory lung diseases or recurrent infections. SYMPTOMS  Shortness of breath, especially with physical activity.  Deep, persistent (chronic) cough with a large amount of thick mucus.  Wheezing.  Rapid breaths (tachypnea).  Gray or bluish discoloration (cyanosis) of the skin, especially in your fingers, toes, or lips.  Fatigue.  Weight loss.  Frequent infections or episodes when breathing symptoms become much worse (exacerbations).  Chest tightness. DIAGNOSIS Your health care provider will take a medical history and perform a physical examination to diagnose COPD. Additional tests for COPD may include:  Lung (pulmonary) function tests.  Chest X-ray.  CT scan.  Blood tests. TREATMENT  Treatment for COPD may include:  Inhaler and nebulizer medicines. These help manage the symptoms of COPD and make your breathing more comfortable.  Supplemental oxygen. Supplemental oxygen is only helpful if you have a low oxygen level in your blood.  Exercise and physical activity. These are beneficial for nearly all people with COPD.  Lung surgery or transplant.  Nutrition therapy to gain weight, if you are underweight.  Pulmonary rehabilitation. This may involve working with a team of health care providers and specialists, such as respiratory, occupational, and physical therapists. HOME CARE INSTRUCTIONS  Take all medicines  (inhaled or pills) as directed by your health care provider.  Avoid over-the-counter medicines or cough syrups that dry up your airway (such as antihistamines) and slow down the elimination of secretions unless instructed otherwise by your health care provider.  If you are a smoker, the most important thing that you can do is stop smoking. Continuing to smoke will cause further lung damage and breathing trouble. Ask your health care provider for help with quitting smoking. He or she can direct you to community resources or hospitals that provide support.  Avoid exposure to irritants such as smoke, chemicals, and fumes that aggravate your breathing.  Use oxygen therapy and pulmonary rehabilitation if directed by your health care provider. If you require home oxygen therapy, ask your health care provider whether you should purchase a pulse oximeter to measure your oxygen level at home.  Avoid contact with individuals who have a contagious illness.  Avoid extreme temperature and humidity changes.  Eat healthy foods. Eating smaller, more frequent meals and resting before meals may help you maintain your strength.  Stay active, but balance activity with periods of rest. Exercise and physical activity will help you maintain your ability to do things you want to do.  Preventing infection and hospitalization is very important when you have COPD. Make sure to receive all the vaccines your health care provider recommends, especially the pneumococcal and influenza vaccines. Ask your health care provider whether you need a pneumonia vaccine.  Learn and use relaxation techniques to manage stress.  Learn and use controlled breathing techniques as directed by your health care provider. Controlled breathing techniques include:  Pursed lip breathing. Start by breathing in (inhaling) through your nose for 1 second. Then, purse your lips as if you were   going to whistle and breathe out (exhale) through the  pursed lips for 2 seconds.  Diaphragmatic breathing. Start by putting one hand on your abdomen just above your waist. Inhale slowly through your nose. The hand on your abdomen should move out. Then purse your lips and exhale slowly. You should be able to feel the hand on your abdomen moving in as you exhale.  Learn and use controlled coughing to clear mucus from your lungs. Controlled coughing is a series of short, progressive coughs. The steps of controlled coughing are: 1. Lean your head slightly forward. 2. Breathe in deeply using diaphragmatic breathing. 3. Try to hold your breath for 3 seconds. 4. Keep your mouth slightly open while coughing twice. 5. Spit any mucus out into a tissue. 6. Rest and repeat the steps once or twice as needed. SEEK MEDICAL CARE IF:  You are coughing up more mucus than usual.  There is a change in the color or thickness of your mucus.  Your breathing is more labored than usual.  Your breathing is faster than usual. SEEK IMMEDIATE MEDICAL CARE IF:  You have shortness of breath while you are resting.  You have shortness of breath that prevents you from:  Being able to talk.  Performing your usual physical activities.  You have chest pain lasting longer than 5 minutes.  Your skin color is more cyanotic than usual.  You measure low oxygen saturations for longer than 5 minutes with a pulse oximeter. MAKE SURE YOU:  Understand these instructions.  Will watch your condition.  Will get help right away if you are not doing well or get worse.   This information is not intended to replace advice given to you by your health care provider. Make sure you discuss any questions you have with your health care provider.   Document Released: 03/22/2005 Document Revised: 07/03/2014 Document Reviewed: 02/06/2013 Elsevier Interactive Patient Education 2016 [employer] Inc[employer]  

## 2015-08-13 ENCOUNTER — Other Ambulatory Visit: Payer: Self-pay | Admitting: Family Medicine

## 2015-08-17 ENCOUNTER — Ambulatory Visit: Payer: Commercial Managed Care - HMO | Admitting: Internal Medicine

## 2015-08-18 ENCOUNTER — Telehealth: Payer: Self-pay | Admitting: *Deleted

## 2015-08-18 DIAGNOSIS — J449 Chronic obstructive pulmonary disease, unspecified: Secondary | ICD-10-CM

## 2015-08-18 NOTE — Telephone Encounter (Signed)
Pt informed of needing O2 at night @ 2L.

## 2015-08-19 ENCOUNTER — Telehealth: Payer: Self-pay | Admitting: Internal Medicine

## 2015-08-19 MED ORDER — FLUTICASONE FUROATE-VILANTEROL 100-25 MCG/INH IN AEPB: 1.0000 | INHALATION_SPRAY | Freq: Every day | RESPIRATORY_TRACT | Status: DC

## 2015-08-19 MED ORDER — FLUTICASONE-SALMETEROL 500-50 MCG/DOSE IN AEPB: 1.0000 | INHALATION_SPRAY | Freq: Two times a day (BID) | RESPIRATORY_TRACT | Status: DC

## 2015-08-19 NOTE — Telephone Encounter (Signed)
°*  STAT* If patient is at the pharmacy, call can be transferred to refill team.   1. Which medications need to be refilled? (please list name of each medication and dose if known)Fluticasone Furoate-Vilanterol 100-25 MCG/INH AEPB 1 PUFF DAILY   2. Which pharmacy/location (including street and city if local pharmacy) is medication to be sent to? [place]    3. Do they need a 30 day or 90 day supply? 30 PER PATIENT HE IS OUT OF THIS MEDICINE AND DOES NOT HAVE TOMORROWS DOSE

## 2015-08-19 NOTE — Telephone Encounter (Signed)
rx sent to pharmacy. Pt informed.

## 2015-09-02 ENCOUNTER — Ambulatory Visit (INDEPENDENT_AMBULATORY_CARE_PROVIDER_SITE_OTHER): Payer: PPO | Admitting: Cardiology

## 2015-09-02 ENCOUNTER — Encounter: Payer: Self-pay | Admitting: Cardiology

## 2015-09-02 VITALS — BP 100/62 | HR 103 | Ht 72.0 in | Wt 164.2 lb

## 2015-09-02 DIAGNOSIS — I10 Essential (primary) hypertension: Secondary | ICD-10-CM | POA: Diagnosis not present

## 2015-09-02 DIAGNOSIS — I480 Paroxysmal atrial fibrillation: Secondary | ICD-10-CM | POA: Diagnosis not present

## 2015-09-02 DIAGNOSIS — R0602 Shortness of breath: Secondary | ICD-10-CM | POA: Diagnosis not present

## 2015-09-02 DIAGNOSIS — I509 Heart failure, unspecified: Secondary | ICD-10-CM

## 2015-09-02 DIAGNOSIS — E785 Hyperlipidemia, unspecified: Secondary | ICD-10-CM

## 2015-09-02 DIAGNOSIS — I5032 Chronic diastolic (congestive) heart failure: Secondary | ICD-10-CM | POA: Diagnosis not present

## 2015-09-02 DIAGNOSIS — I493 Ventricular premature depolarization: Secondary | ICD-10-CM

## 2015-09-02 MED ORDER — METOPROLOL TARTRATE 25 MG PO TABS: 25.0000 mg | ORAL_TABLET | Freq: Two times a day (BID) | ORAL | Status: DC

## 2015-09-02 NOTE — Patient Instructions (Signed)
Medication Instructions:  Your physician has recommended you make the following change in your medication:  1. Stop Amlodipine 2. Start Metoprolol 25 mg Twice daily   Labwork: None Ordered  Testing/Procedures: None Ordered  Follow-Up: Your physician recommends that you schedule a follow-up appointment in: 1 month with Dr. Yvone Neu  Date & Time: _____________________________________________________   Any Other Special Instructions Will Be Listed Below (If Applicable).     If you need a refill on your cardiac medications before your next appointment, please call your pharmacy.

## 2015-09-02 NOTE — Progress Notes (Signed)
Cardiology Office Note   Date:  09/02/2015   ID:  Alan Weaver, DOB [birth year], MRN [id number]  Referring Doctor:  Lelon Huh, MD   Cardiologist:   Wende Bushy, MD   Reason for consultation:  Chief Complaint  Patient presents with  . other    CHF c/o sob and edema ankles/feet. Pt had echo in 06/2015 would like interpretation. Meds reviewed verbally with pt.      History of Present Illness: Alan Weaver is a 82 y.o. male who presents for Shortness of breath. Patient has long-standing history of severe emphysema/COPD, chronic oxygen therapy, non-small cell lung cancer, prostate cancer. He has shortness of breath going on for many years now slightly worse over the years, shortness breath with minimal exertion. Able to do some chores at home, needing a stool to sit down and rest. Shortness of breath is precipitated by minimal exertion. Chronic shortness of breath. No chest pains, nausea, diaphoresis.  Denies headache, cough, colds, abdominal pain, orthopnea, PND and occasional ankle swelling.  Patient used to see cardiologist at [facility]. Most recent office report not available for review. Last time patient was seen was proximally 5 years ago   ROS:  Please see the history of present illness. Aside from mentioned under HPI, all other systems are reviewed and negative.     Past Medical History  Diagnosis Date  . Heart murmur   . COPD (chronic obstructive pulmonary disease) ([place])   . High blood cholesterol level   . Irritable bowel syndrome   . Diabetes mellitus without complication ([place])   . Osteoporosis   . Incontinence   . Paroxysmal a-fib ([place])   . Cancer [facility]) 1994    prostate, then lung  . Pneumonia   . Hypertension   . Chronic kidney disease     KIDNEY STONES  . GERD (gastroesophageal reflux disease)   . On home oxygen therapy     2L [place] PRN  Patient unaware of atrial fibrillation history. He has never been on anticoagulation. Awaiting records  from previous cardiologist.  Past Surgical History  Procedure Laterality Date  . Colonoscopy  03/12/13  . Prostate surgery  1994  . Hernia repair  2011  . Surgery for staph infection  1995  . Colonoscopy  2011  . Bone density test      Spine T-spine = -1.7  . Electromagnetic navigation brochoscopy N/A 01/07/2015    Procedure: ELECTROMAGNETIC NAVIGATION BRONCHOSCOPY;  Surgeon: Vilinda Boehringer, MD;  Location: [facility] ORS;  Service: Cardiopulmonary;  Laterality: N/A;     reports that he quit smoking about 9 years ago. His smoking use included Cigarettes. He smoked 1.00 pack per day. He has never used smokeless tobacco. He reports that he does not drink alcohol or use illicit drugs.   family history includes Asthma in his mother; Cancer in his brother and father; Osteoporosis in his mother; Other in his sister.   Current Outpatient Prescriptions  Medication Sig Dispense Refill  . albuterol (PROVENTIL HFA;VENTOLIN HFA) 108 (90 BASE) MCG/ACT inhaler Inhale 2 puffs into the lungs every 6 (six) hours as needed for wheezing or shortness of breath.     [name] alendronate (FOSAMAX) 70 MG tablet TAKE ONE TABLET BY MOUTH ONCE A WEEK 12 tablet 4  . amLODipine (NORVASC) 5 MG tablet Take 5 mg by mouth daily.    [name] aspirin EC 81 MG tablet Take 162 mg by mouth every 4 (four) hours as needed for mild pain.     [name]  azithromycin (ZITHROMAX) 250 MG tablet Take 1 tablet (250 mg total) by mouth daily. 4 each 0  . Calcium Carbonate-Vitamin D (CALCIUM 600+D) 600-400 MG-UNIT tablet Take 1 tablet by mouth 2 (two) times daily.    . cholecalciferol (VITAMIN D) 1000 UNITS tablet Take 2,000 Units by mouth daily.     . Cranberry-Vitamin C-Probiotic (AZO CRANBERRY) 250-30 MG TABS Take 2 tablets by mouth every evening.    . Fluticasone-Salmeterol (ADVAIR DISKUS) 500-50 MCG/DOSE AEPB Inhale 1 puff into the lungs 2 (two) times daily. 60 each 5  . ipratropium-albuterol (DUONEB) 0.5-2.5 (3) MG/3ML SOLN USE ONE VIAL IN NEBULIZER 4 TIMES  DAILY 1080 mL 4  . Leuprolide Acetate (LUPRON IJ) Inject 1 Dose as directed every 3 (three) months.     . metFORMIN (GLUCOPHAGE) 500 MG tablet TAKE ONE TABLET BY MOUTH ONCE DAILY 30 tablet 3  . Omega-3 Fatty Acids (FISH OIL) 1000 MG CAPS Take 2,000 mg by mouth daily.     . pravastatin (PRAVACHOL) 40 MG tablet Take 40 mg by mouth daily.     . predniSONE (DELTASONE) 20 MG tablet Take 1 tablet (20 mg total) by mouth daily with breakfast. 30 tablet 2  . tiotropium (SPIRIVA) 18 MCG inhalation capsule Place 18 mcg into inhaler and inhale daily.     No current facility-administered medications for this visit.   Patient previously on metoprolol. This was discontinued while he was in the hospital for low blood pressure.  Allergies: Review of patient's allergies indicates no known allergies.    PHYSICAL EXAM: VS:  BP 100/62 mmHg  Pulse 103  Ht 6' (1.829 m)  Wt 164 lb 4 oz (74.503 kg)  BMI 22.27 kg/m2 , Body mass index is 22.27 kg/(m^2). Wt Readings from Last 3 Encounters:  09/02/15 164 lb 4 oz (74.503 kg)  08/12/15 163 lb 6.4 oz (74.118 kg)  08/02/15 162 lb 14.7 oz (73.9 kg)    GENERAL:  well developed, well nourished, in mild respiratory distress HEENT: normocephalic, pink conjunctivae, anicteric sclerae, no xanthelasma, normal dentition, oropharynx clear NECK:  no neck vein engorgement, JVP normal, no hepatojugular reflux, carotid upstroke brisk and symmetric, no bruit, no thyromegaly, no lymphadenopathy LUNGS:  Fair respiratory effort, pursed lip breathing, diminished breath sounds bilaterally, rhonchi diffusely CV:  PMI not displaced, no thrills, no lifts, S1 and S2 within normal limits, no palpable S3 or S4, no murmurs, no rubs, no gallops ABD:  Soft, nontender, nondistended, normoactive bowel sounds, no abdominal aortic bruit, no hepatomegaly, no splenomegaly MS: nontender back, positive kyphosis, no scoliosis, no joint deformities EXT:  2+ DP/PT pulses, trace edema, no varicosities, no  cyanosis, no clubbing SKIN: warm, nondiaphoretic, normal turgor, no ulcers NEUROPSYCH: alert, oriented to person, place, and time, sensory/motor grossly intact, normal mood, appropriate affect    Other studies Reviewed:  EKG:  EKG is ordered today. The ekg 09/02/2015 ordered today was personally reviewed by me and it reveals sinus tachycardia, 101 BPM, frequent PVCs, right atrial enlargement.  Additional studies/ records that were reviewed personally reviewed by me today include:  Echo 07/14/2015: LVEF 50-55%. Abnormal left ventricular relaxation. Grade 1 diastolic dysfunction. PA pressure 36 mmHg.  ASSESSMENT AND PLAN:  -Shortness of breath  Patient has significant lung pathology including severe emphysema/COPD, chronic oxygen therapy, 2 L of oxygen every day, non-small cell lung cancer on the right lung. Echocardiogram from 07/14/2015 shows preserved ejection fraction and abnormal left ventricular relaxation. There may be some element of mild diastolic dysfunction that may be  contributing minimally to patient's shortness of breath. Ideally, further investigation with stress testing can be done. However, based on medical records that are reviewed, prognosis due to patient's pulmonary condition is poor. This was discussed in detail with the patient and his daughter. Patient does not wish to undergo any invasive testing. Therefore, defer stress testing for now. Rec medical therapy. D/c amlodipine and start metoprolol '25mg'$  po bid. Cont asa, statin.   Possible CHF, diastolic dysfunction, chronic Medical therapy as above  HTN BP is well controlled. Continue monitoring BP. Continue current medical therapy and lifestyle changes. Monitor bp with change in meds.  Hyperlipidemia Cont statin therapy  pvcs Wil start metoprolol. Cont to monitor.  Sinus tachycardia Likely due to severe emphysema   Current medicines are reviewed at length with the patient today.  The patient does not have  concerns regarding medicines.  Labs/ tests ordered today include: Orders Placed This Encounter  Procedures  . EKG 12-Lead    I had a lengthy and detailed discussion with the patient regarding diagnoses, prognosis, diagnostic options, treatment options, and side effects of medications.     Disposition:   FU with undersigned in 1 month   Signed, Wende Bushy, MD  09/02/2015 2:23 PM    [place]

## 2015-09-10 ENCOUNTER — Ambulatory Visit (INDEPENDENT_AMBULATORY_CARE_PROVIDER_SITE_OTHER): Payer: PPO | Admitting: Family Medicine

## 2015-09-10 ENCOUNTER — Encounter: Payer: Self-pay | Admitting: Family Medicine

## 2015-09-10 VITALS — BP 138/78 | HR 72 | Temp 97.6°F | Resp 16 | Wt 165.6 lb

## 2015-09-10 DIAGNOSIS — J432 Centrilobular emphysema: Secondary | ICD-10-CM

## 2015-09-10 DIAGNOSIS — I10 Essential (primary) hypertension: Secondary | ICD-10-CM

## 2015-09-10 DIAGNOSIS — C61 Malignant neoplasm of prostate: Secondary | ICD-10-CM

## 2015-09-10 DIAGNOSIS — I480 Paroxysmal atrial fibrillation: Secondary | ICD-10-CM

## 2015-09-10 DIAGNOSIS — E782 Mixed hyperlipidemia: Secondary | ICD-10-CM

## 2015-09-10 DIAGNOSIS — C3491 Malignant neoplasm of unspecified part of right bronchus or lung: Secondary | ICD-10-CM | POA: Diagnosis not present

## 2015-09-10 DIAGNOSIS — E1142 Type 2 diabetes mellitus with diabetic polyneuropathy: Secondary | ICD-10-CM | POA: Diagnosis not present

## 2015-09-10 NOTE — Progress Notes (Signed)
Patient ID: TRYTON BODI, male   DOB: 02/19/1927, 82 y.o.   MRN: [id number]   Patient: Alan Weaver Male    DOB: 1926-12-24   82 y.o.   MRN: [id number] Visit Date: 09/10/2015  Today's Provider: Vernie Murders, PA   Chief Complaint  Patient presents with  . Hyperlipidemia  . Hypertension  . Diabetes  . Follow-up   Subjective:    HPI  Diabetes Mellitus Type II, Follow-up:   Lab Results  Component Value Date   HGBA1C 7.0* 06/20/2015   HGBA1C 6.4 05/13/2015   HGBA1C 6.6 02/09/2015   Last seen for diabetes 4 months ago.  Management since then includes none. He reports good compliance with treatment. He is not having side effects.  Current symptoms include none and have been stable. Home blood sugar records: 150-160's  Episodes of hypoglycemia? no   Weight trend: stable Current diet: in general, an "unhealthy" diet Current exercise: none  ------------------------------------------------------------------------   Hypertension, follow-up:  BP Readings from Last 3 Encounters:  09/10/15 138/78  09/02/15 100/62  08/12/15 124/72    He was last seen for hypertension 4 months ago.  BP at that visit was 122/70. Management since that visit includes none .He reports good compliance with treatment. He is not having side effects.  He is not exercising. He is not adherent to low salt diet.   Outside blood pressures are not being checked. He is experiencing none.  Patient denies none.   Cardiovascular risk factors include advanced age (older than 17 for men, 67 for women), diabetes mellitus, dyslipidemia, hypertension and male gender.  Use of agents associated with hypertension: none.   ------------------------------------------------------------------------    Lipid/Cholesterol, Follow-up:   Last seen for this 4 months ago.  Management since that visit includes none.  Last Lipid Panel: No results found for: CHOL, TRIG, HDL, CHOLHDL, VLDL, LDLCALC,  LDLDIRECT  He reports good compliance with treatment. He is not having side effects.   Wt Readings from Last 3 Encounters:  09/10/15 165 lb 9.6 oz (75.116 kg)  09/02/15 164 lb 4 oz (74.503 kg)  08/12/15 163 lb 6.4 oz (74.118 kg)    ------------------------------------------------------------------------   Past Medical History  Diagnosis Date  . Heart murmur   . COPD (chronic obstructive pulmonary disease) ([place])   . High blood cholesterol level   . Irritable bowel syndrome   . Diabetes mellitus without complication ([place])   . Osteoporosis   . Incontinence   . Paroxysmal a-fib ([place])   . Cancer [facility]) 1994    prostate, then lung  . Pneumonia   . Hypertension   . Chronic kidney disease     KIDNEY STONES  . GERD (gastroesophageal reflux disease)   . On home oxygen therapy     2L [place] PRN   Past Surgical History  Procedure Laterality Date  . Colonoscopy  03/12/13  . Prostate surgery  1994  . Hernia repair  2011  . Surgery for staph infection  1995  . Colonoscopy  2011  . Bone density test      Spine T-spine = -1.7  . Electromagnetic navigation brochoscopy N/A 01/07/2015    Procedure: ELECTROMAGNETIC NAVIGATION BRONCHOSCOPY;  Surgeon: Vilinda Boehringer, MD;  Location: [facility] ORS;  Service: Cardiopulmonary;  Laterality: N/A;   Family History  Problem Relation Age of Onset  . Cancer Father     bladder  . Other Sister     brain tumor  . Cancer Brother     colon  .  Asthma Mother   . Osteoporosis Mother    Previous Medications   ALBUTEROL (PROVENTIL HFA;VENTOLIN HFA) 108 (90 BASE) MCG/ACT INHALER    Inhale 2 puffs into the lungs every 6 (six) hours as needed for wheezing or shortness of breath.    ALENDRONATE (FOSAMAX) 70 MG TABLET    TAKE ONE TABLET BY MOUTH ONCE A WEEK   ASPIRIN EC 81 MG TABLET    Take 162 mg by mouth every 4 (four) hours as needed for mild pain.    AZITHROMYCIN (ZITHROMAX) 250 MG TABLET    Take 1 tablet (250 mg total) by mouth daily.   CALCIUM  CARBONATE-VITAMIN D (CALCIUM 600+D) 600-400 MG-UNIT TABLET    Take 1 tablet by mouth 2 (two) times daily.   CHOLECALCIFEROL (VITAMIN D) 1000 UNITS TABLET    Take 2,000 Units by mouth daily.    CRANBERRY-VITAMIN C-PROBIOTIC (AZO CRANBERRY) 250-30 MG TABS    Take 2 tablets by mouth every evening.   FLUTICASONE-SALMETEROL (ADVAIR DISKUS) 500-50 MCG/DOSE AEPB    Inhale 1 puff into the lungs 2 (two) times daily.   IPRATROPIUM-ALBUTEROL (DUONEB) 0.5-2.5 (3) MG/3ML SOLN    USE ONE VIAL IN NEBULIZER 4 TIMES DAILY   LEUPROLIDE ACETATE (LUPRON IJ)    Inject 1 Dose as directed every 3 (three) months.    METFORMIN (GLUCOPHAGE) 500 MG TABLET    TAKE ONE TABLET BY MOUTH ONCE DAILY   METOPROLOL TARTRATE (LOPRESSOR) 25 MG TABLET    Take 1 tablet (25 mg total) by mouth 2 (two) times daily.   OMEGA-3 FATTY ACIDS (FISH OIL) 1000 MG CAPS    Take 2,000 mg by mouth daily.    PRAVASTATIN (PRAVACHOL) 40 MG TABLET    Take 40 mg by mouth daily.    PREDNISONE (DELTASONE) 20 MG TABLET    Take 1 tablet (20 mg total) by mouth daily with breakfast.   TIOTROPIUM (SPIRIVA) 18 MCG INHALATION CAPSULE    Place 18 mcg into inhaler and inhale daily.   No Known Allergies  Review of Systems  Constitutional: Negative.   HENT: Negative.   Eyes: Negative.   Respiratory: Negative.   Cardiovascular: Negative.   Gastrointestinal: Negative.   Endocrine: Negative.   Genitourinary: Negative.   Musculoskeletal: Negative.   Skin: Negative.   Allergic/Immunologic: Negative.   Neurological: Negative.   Hematological: Negative.   Psychiatric/Behavioral: Negative.     Social History  Substance Use Topics  . Smoking status: Former Smoker -- 1.00 packs/day    Types: Cigarettes    Quit date: 05/15/2006  . Smokeless tobacco: Never Used  . Alcohol Use: No   Objective:   BP 138/78 mmHg  Pulse 72  Temp(Src) 97.6 F (36.4 C) (Oral)  Resp 16  Wt 165 lb 9.6 oz (75.116 kg)  Physical Exam  Constitutional: He is oriented to person,  place, and time. He appears well-developed and well-nourished. No distress.  HENT:  Head: Normocephalic and atraumatic.  Right Ear: Hearing and external ear normal.  Left Ear: Hearing and external ear normal.  Nose: Nose normal.  Mouth/Throat: Oropharynx is clear and moist.  Eyes: Conjunctivae and lids are normal. Right eye exhibits no discharge. Left eye exhibits no discharge. No scleral icterus.  Neck: Normal range of motion. Neck supple.  Cardiovascular: Normal rate and regular rhythm.   Pulmonary/Chest: He is in respiratory distress.  Distant breath sounds with dyspnea and on continuous oxygen at 2 LPM 24 hours a day.  Abdominal: Soft. Bowel sounds are normal.  Neurological: He is alert and oriented to person, place, and time.  Skin: Skin is intact. No lesion and no rash noted.  Psychiatric: He has a normal mood and affect. His speech is normal and behavior is normal. Thought content normal.      Assessment & Plan:     1. Centrilobular emphysema (HCC) Dyspnea persistent. On Advair, Spiriva, Duoneb (by nebulizer) and Proventil-HFA per pulmonologist (Dr. Mortimer Fries). Some clear sputum in the mornings. Using oxygen at 2 LPM 24 hours a day now. Continue follow up with pulmonologist.  2. Non-small cell cancer of right lung ([place]) Stable. States oncologist (Dr. Oliva Bustard) thinks the lesion is a little smaller since going through radiation treatments by Dr. Donella Stade. Continue regular follow up with Dr. Santo Held in 10 days.  3. DM type 2 with diabetic peripheral neuropathy (HCC) BS ranging around 150-160 at home. Unable to exercise. Tolerating Metformin without hypoglycemic episodes. Will recheck labs. Tingling in feet unchanged.  - Hemoglobin A1c  4. Prostate cancer ([place]) Still getting Lupron injection every 3 months per Dr. Oliva Bustard.   5. Paroxysmal a-fib (HCC) Denies chest pain. Followed by cardiologist (Dr. Yvone Neu). Was given Metoprolol 25 mg BID. Echocardiogram showed some LV enlargement and EF of  50-55%. Continue follow up with Dr. Yvone Neu as planned.  6. Essential hypertension Stable and well controlled on the Metoprolol. Recheck pending labs.  7. Mixed hyperlipidemia Tolerating Omega-3 and Pravastatin. Recheck lipids and encouraged low fat diet. - TSH - Lipid panel

## 2015-09-11 LAB — LIPID PANEL
CHOL/HDL RATIO: 2.8 ratio (ref 0.0–5.0)
Cholesterol, Total: 234 mg/dL — ABNORMAL HIGH (ref 100–199)
HDL: 85 mg/dL (ref >39)
LDL Calculated: 124 mg/dL — ABNORMAL HIGH (ref 0–99)
TRIGLYCERIDES: 123 mg/dL (ref 0–149)
VLDL Cholesterol Cal: 25 mg/dL (ref 5–40)

## 2015-09-11 LAB — HEMOGLOBIN A1C
Est. average glucose Bld gHb Est-mCnc: 177 mg/dL
HEMOGLOBIN A1C: 7.8 % — AB (ref 4.8–5.6)

## 2015-09-11 LAB — TSH: TSH: 1.43 u[IU]/mL (ref 0.450–4.500)

## 2015-09-13 ENCOUNTER — Encounter: Payer: Self-pay | Admitting: Internal Medicine

## 2015-09-13 ENCOUNTER — Telehealth: Payer: Self-pay

## 2015-09-13 ENCOUNTER — Ambulatory Visit (INDEPENDENT_AMBULATORY_CARE_PROVIDER_SITE_OTHER): Payer: PPO | Admitting: Internal Medicine

## 2015-09-13 VITALS — BP 124/64 | HR 73 | Ht 72.0 in | Wt 163.6 lb

## 2015-09-13 DIAGNOSIS — J449 Chronic obstructive pulmonary disease, unspecified: Secondary | ICD-10-CM

## 2015-09-13 NOTE — Telephone Encounter (Signed)
LMTCB

## 2015-09-13 NOTE — Patient Instructions (Signed)
Chronic Obstructive Pulmonary Disease Chronic obstructive pulmonary disease (COPD) is a common lung condition in which airflow from the lungs is limited. COPD is a general term that can be used to describe many different lung problems that limit airflow, including both chronic bronchitis and emphysema. If you have COPD, your lung function will probably never return to normal, but there are measures you can take to improve lung function and make yourself feel better. CAUSES   Smoking (common).  Exposure to secondhand smoke.  Genetic problems.  Chronic inflammatory lung diseases or recurrent infections. SYMPTOMS  Shortness of breath, especially with physical activity.  Deep, persistent (chronic) cough with a large amount of thick mucus.  Wheezing.  Rapid breaths (tachypnea).  Gray or bluish discoloration (cyanosis) of the skin, especially in your fingers, toes, or lips.  Fatigue.  Weight loss.  Frequent infections or episodes when breathing symptoms become much worse (exacerbations).  Chest tightness. DIAGNOSIS Your health care provider will take a medical history and perform a physical examination to diagnose COPD. Additional tests for COPD may include:  Lung (pulmonary) function tests.  Chest X-ray.  CT scan.  Blood tests. TREATMENT  Treatment for COPD may include:  Inhaler and nebulizer medicines. These help manage the symptoms of COPD and make your breathing more comfortable.  Supplemental oxygen. Supplemental oxygen is only helpful if you have a low oxygen level in your blood.  Exercise and physical activity. These are beneficial for nearly all people with COPD.  Lung surgery or transplant.  Nutrition therapy to gain weight, if you are underweight.  Pulmonary rehabilitation. This may involve working with a team of health care providers and specialists, such as respiratory, occupational, and physical therapists. HOME CARE INSTRUCTIONS  Take all medicines  (inhaled or pills) as directed by your health care provider.  Avoid over-the-counter medicines or cough syrups that dry up your airway (such as antihistamines) and slow down the elimination of secretions unless instructed otherwise by your health care provider.  If you are a smoker, the most important thing that you can do is stop smoking. Continuing to smoke will cause further lung damage and breathing trouble. Ask your health care provider for help with quitting smoking. He or she can direct you to community resources or hospitals that provide support.  Avoid exposure to irritants such as smoke, chemicals, and fumes that aggravate your breathing.  Use oxygen therapy and pulmonary rehabilitation if directed by your health care provider. If you require home oxygen therapy, ask your health care provider whether you should purchase a pulse oximeter to measure your oxygen level at home.  Avoid contact with individuals who have a contagious illness.  Avoid extreme temperature and humidity changes.  Eat healthy foods. Eating smaller, more frequent meals and resting before meals may help you maintain your strength.  Stay active, but balance activity with periods of rest. Exercise and physical activity will help you maintain your ability to do things you want to do.  Preventing infection and hospitalization is very important when you have COPD. Make sure to receive all the vaccines your health care provider recommends, especially the pneumococcal and influenza vaccines. Ask your health care provider whether you need a pneumonia vaccine.  Learn and use relaxation techniques to manage stress.  Learn and use controlled breathing techniques as directed by your health care provider. Controlled breathing techniques include:  Pursed lip breathing. Start by breathing in (inhaling) through your nose for 1 second. Then, purse your lips as if you were   going to whistle and breathe out (exhale) through the  pursed lips for 2 seconds.  Diaphragmatic breathing. Start by putting one hand on your abdomen just above your waist. Inhale slowly through your nose. The hand on your abdomen should move out. Then purse your lips and exhale slowly. You should be able to feel the hand on your abdomen moving in as you exhale.  Learn and use controlled coughing to clear mucus from your lungs. Controlled coughing is a series of short, progressive coughs. The steps of controlled coughing are: 1. Lean your head slightly forward. 2. Breathe in deeply using diaphragmatic breathing. 3. Try to hold your breath for 3 seconds. 4. Keep your mouth slightly open while coughing twice. 5. Spit any mucus out into a tissue. 6. Rest and repeat the steps once or twice as needed. SEEK MEDICAL CARE IF:  You are coughing up more mucus than usual.  There is a change in the color or thickness of your mucus.  Your breathing is more labored than usual.  Your breathing is faster than usual. SEEK IMMEDIATE MEDICAL CARE IF:  You have shortness of breath while you are resting.  You have shortness of breath that prevents you from:  Being able to talk.  Performing your usual physical activities.  You have chest pain lasting longer than 5 minutes.  Your skin color is more cyanotic than usual.  You measure low oxygen saturations for longer than 5 minutes with a pulse oximeter. MAKE SURE YOU:  Understand these instructions.  Will watch your condition.  Will get help right away if you are not doing well or get worse.   This information is not intended to replace advice given to you by your health care provider. Make sure you discuss any questions you have with your health care provider.   Document Released: 03/22/2005 Document Revised: 07/03/2014 Document Reviewed: 02/06/2013 Elsevier Interactive Patient Education 2016 [employer] Inc[employer]  

## 2015-09-13 NOTE — Progress Notes (Signed)
MRN# [id number] Alan Weaver 03/26/1927   CC: Worsening SOB, DOE, follow up COPD, lung cancer   HPI:  Patient with chronic  COPD-DOE and SOB with exertion worsened over past couple of months Currently on  prednisone for last 8 weeks 20 mg daily  Completed SBRT on 02/22/15 for RUL mass. Still with persistent gradual increase with DOE. Uses oxygen with exertion No signs of infection at this time, ONO reveals  he needs oxygen at night Last ECHO in may 2016 showed grade 1 diastolic dysfunction, current January 2017 ECHO shows EF 50% and grade 1 Diastolic dys  Review of Systems: Gen:  Denies  fever, sweats, chills HEENT: Denies blurred vision, double vision, ear pain, eye pain, hearing loss, nose bleeds, sore throat Cvc:  No dizziness, chest pain or heaviness Resp:   Admits DJ:MEQASTM sob and sputum production, +DOE Ext:   No Joint pain, stiffness or swelling Other:  All other systems negative  Allergies:  Review of patient's allergies indicates no known allergies.  Physical Examination:  VS: BP 124/64 mmHg  Pulse 73  Ht 6' (1.829 m)  Wt 163 lb 9.6 oz (74.208 kg)  BMI 22.18 kg/m2  SpO2 94%  General Appearance: mild distress  HEENT: PERRLA, no ptosis, no other lesions noticed, pursed lip breathing Pulmonary:distant  breath sounds., diaphragmatic excursion normal.No wheezing, No rales , mild dec in BS at the bases.  Cardiovascular:  Normal S1,S2.  No m/r/g.     Abdomen:Exam: Benign, Soft, non-tender, No masses  Skin:   warm, no rashes, no ecchymosis  Extremities: normal, no cyanosis, +edema  Biopsy result from transbronch forcepts 01/07/15 DIAGNOSIS:  A. LUNG, RIGHT UPPER LOBE; ENB FORCEPS BIOPSY:  - NON-SMALL CELL CARCINOMA, FAVOR SQUAMOUS CELL CARCINOMA.   Note:  A cell block was included in the interpretation of this case. Specimen  reviewed in conjunction with ARC-133 with IHC results.    Assessment and Plan:82 yo with COPD end stage with chronic resp insufficiency,  s\p bronch with biopsy for RUL mass, found to be NSCLC Patient with chronic worsening of SOB/DOE. Patient with severe end stage COPD and resp insufficiency  1.will continue Advair and Spiriva 2.continue oxygen as prescribed and as needed 3.cont azithromycin 4.continue dounebs as prescribed 5.continue prednisone as prescribed 6.follow up cardiology referral 7.I have suggested sleep study at this time, but patient refused  Follow up in 3 months  Prognosis is  poor, poor quality of life with resp status. The Patient requires high complexity decision making for assessment and support, frequent evaluation and titration of therapies. Patient  are satisfied with Plan of action and management. All questions answered  Corrin Parker, M.D.  [facility]  Medical Director [place] Director [facility] Cardio-Pulmonary Department

## 2015-09-13 NOTE — Telephone Encounter (Signed)
-----   Message from Margo Common, [place] sent at 09/13/2015  8:35 AM EDT ----- HDL cholesterol in great shape but LDL still elevated. Hgb A1C higher than last check. Should continue same dose of Pravastatin and increase Metformin to 500 mg BID. Recheck diabetes in 3 months.

## 2015-09-14 NOTE — Telephone Encounter (Signed)
Patient advised as directed below. Patient verbalized understanding and agrees with plan of care.  

## 2015-09-20 ENCOUNTER — Inpatient Hospital Stay: Payer: PPO | Admitting: Oncology

## 2015-09-20 ENCOUNTER — Inpatient Hospital Stay: Payer: PPO

## 2015-09-20 ENCOUNTER — Inpatient Hospital Stay: Payer: PPO | Attending: Family Medicine | Admitting: Family Medicine

## 2015-09-20 VITALS — BP 120/68 | HR 81 | Temp 98.4°F | Wt 162.0 lb

## 2015-09-20 DIAGNOSIS — K219 Gastro-esophageal reflux disease without esophagitis: Secondary | ICD-10-CM

## 2015-09-20 DIAGNOSIS — R32 Unspecified urinary incontinence: Secondary | ICD-10-CM

## 2015-09-20 DIAGNOSIS — Z79899 Other long term (current) drug therapy: Secondary | ICD-10-CM

## 2015-09-20 DIAGNOSIS — Z9981 Dependence on supplemental oxygen: Secondary | ICD-10-CM | POA: Diagnosis not present

## 2015-09-20 DIAGNOSIS — I480 Paroxysmal atrial fibrillation: Secondary | ICD-10-CM

## 2015-09-20 DIAGNOSIS — Z85118 Personal history of other malignant neoplasm of bronchus and lung: Secondary | ICD-10-CM | POA: Diagnosis not present

## 2015-09-20 DIAGNOSIS — M818 Other osteoporosis without current pathological fracture: Secondary | ICD-10-CM | POA: Diagnosis not present

## 2015-09-20 DIAGNOSIS — Z8701 Personal history of pneumonia (recurrent): Secondary | ICD-10-CM

## 2015-09-20 DIAGNOSIS — C61 Malignant neoplasm of prostate: Secondary | ICD-10-CM

## 2015-09-20 DIAGNOSIS — Z87891 Personal history of nicotine dependence: Secondary | ICD-10-CM | POA: Diagnosis not present

## 2015-09-20 DIAGNOSIS — K589 Irritable bowel syndrome without diarrhea: Secondary | ICD-10-CM

## 2015-09-20 DIAGNOSIS — E7800 Pure hypercholesterolemia, unspecified: Secondary | ICD-10-CM

## 2015-09-20 DIAGNOSIS — Z7982 Long term (current) use of aspirin: Secondary | ICD-10-CM | POA: Diagnosis not present

## 2015-09-20 DIAGNOSIS — R531 Weakness: Secondary | ICD-10-CM

## 2015-09-20 DIAGNOSIS — E119 Type 2 diabetes mellitus without complications: Secondary | ICD-10-CM

## 2015-09-20 DIAGNOSIS — R0602 Shortness of breath: Secondary | ICD-10-CM | POA: Diagnosis not present

## 2015-09-20 DIAGNOSIS — J449 Chronic obstructive pulmonary disease, unspecified: Secondary | ICD-10-CM

## 2015-09-20 DIAGNOSIS — R5383 Other fatigue: Secondary | ICD-10-CM | POA: Diagnosis not present

## 2015-09-20 DIAGNOSIS — I10 Essential (primary) hypertension: Secondary | ICD-10-CM

## 2015-09-20 DIAGNOSIS — Z79818 Long term (current) use of other agents affecting estrogen receptors and estrogen levels: Secondary | ICD-10-CM

## 2015-09-20 DIAGNOSIS — Z87442 Personal history of urinary calculi: Secondary | ICD-10-CM | POA: Diagnosis not present

## 2015-09-20 LAB — CBC WITH DIFFERENTIAL/PLATELET
BASOS ABS: 0 10*3/uL (ref 0–0.1)
BASOS PCT: 0 %
Eosinophils Absolute: 0 10*3/uL (ref 0–0.7)
Eosinophils Relative: 0 %
HEMATOCRIT: 40.8 % (ref 40.0–52.0)
HEMOGLOBIN: 13.6 g/dL (ref 13.0–18.0)
Lymphocytes Relative: 6 %
Lymphs Abs: 0.8 10*3/uL — ABNORMAL LOW (ref 1.0–3.6)
MCH: 30.2 pg (ref 26.0–34.0)
MCHC: 33.4 g/dL (ref 32.0–36.0)
MCV: 90.3 fL (ref 80.0–100.0)
Monocytes Absolute: 0.5 10*3/uL (ref 0.2–1.0)
Monocytes Relative: 4 %
NEUTROS ABS: 11.5 10*3/uL — AB (ref 1.4–6.5)
NEUTROS PCT: 90 %
Platelets: 252 10*3/uL (ref 150–440)
RBC: 4.52 MIL/uL (ref 4.40–5.90)
RDW: 15.8 % — AB (ref 11.5–14.5)
WBC: 12.9 10*3/uL — AB (ref 3.8–10.6)

## 2015-09-20 LAB — COMPREHENSIVE METABOLIC PANEL
ALBUMIN: 3.9 g/dL (ref 3.5–5.0)
ALK PHOS: 45 U/L (ref 38–126)
ALT: 15 U/L — ABNORMAL LOW (ref 17–63)
AST: 17 U/L (ref 15–41)
Anion gap: 6 (ref 5–15)
BILIRUBIN TOTAL: 0.9 mg/dL (ref 0.3–1.2)
BUN: 19 mg/dL (ref 6–20)
CO2: 34 mmol/L — AB (ref 22–32)
Calcium: 9.2 mg/dL (ref 8.9–10.3)
Chloride: 98 mmol/L — ABNORMAL LOW (ref 101–111)
Creatinine, Ser: 0.95 mg/dL (ref 0.61–1.24)
GFR calc Af Amer: >60 mL/min (ref >60)
GFR calc non Af Amer: >60 mL/min (ref >60)
GLUCOSE: 165 mg/dL — AB (ref 65–99)
POTASSIUM: 4.2 mmol/L (ref 3.5–5.1)
SODIUM: 138 mmol/L (ref 135–145)
TOTAL PROTEIN: 6.8 g/dL (ref 6.5–8.1)

## 2015-09-20 LAB — PSA: PSA: 0.29 ng/mL (ref 0.00–4.00)

## 2015-09-20 MED ORDER — LEUPROLIDE ACETATE (4 MONTH) 30 MG IM KIT
30.0000 mg | PACK | Freq: Once | INTRAMUSCULAR | Status: AC
  Administered 2015-09-20: 30 mg via INTRAMUSCULAR
  Filled 2015-09-20: qty 30

## 2015-09-20 NOTE — Progress Notes (Signed)
[place]  [phone]  [phone]     Alan Weaver DOB: 05-27-1927  MR#: [id number]  MGQ#:[id number]  Patient Care Team: Birdie Sons, MD as PCP - General (Family Medicine) Robert Bellow, MD (General Surgery) Vilinda Boehringer, MD as Consulting Physician (Internal Medicine)  CHIEF COMPLAINT:  Chief Complaint  Patient presents with  . Prostate Cancer    INTERVAL HISTORY:  Patient is here for continued evaluation and treatment consideration regarding a history of carcinoma of prostate, castration sensitive, as well as a history of stage I right upper lobe lung cancer. He reports overall feeling very well other than some increasing shortness of breath related to her end-stage COPD. He is currently on 2 L of oxygen via nasal cannula 24 hours a day. Patient is in wheelchair today due to increasing shortness of breath with long-distance walking as well as fatigue. He was recently evaluated by his pulmonologist, Dr. Mortimer Fries. He is currently receiving Lupron injections every 4 months.  REVIEW OF SYSTEMS:   Review of Systems  Constitutional: Positive for malaise/fatigue. Negative for fever, chills, weight loss and diaphoresis.  HENT: Negative.   Eyes: Negative.   Respiratory: Positive for shortness of breath. Negative for cough, hemoptysis, sputum production and wheezing.   Cardiovascular: Negative for chest pain, palpitations, orthopnea, claudication, leg swelling and PND.  Gastrointestinal: Negative for heartburn, nausea, vomiting, abdominal pain, diarrhea, constipation, blood in stool and melena.  Genitourinary: Negative.   Musculoskeletal: Negative.   Skin: Negative.   Neurological: Positive for weakness. Negative for dizziness, tingling, focal weakness and seizures.  Endo/Heme/Allergies: Does not bruise/bleed easily.  Psychiatric/Behavioral: Negative for depression. The patient is not nervous/anxious and does not have insomnia.     As per HPI.  Otherwise, a complete review of systems is negatve.  ONCOLOGY HISTORY: Oncology History   1.  Prostate cancer.  Original radical prostatectomy.  Later recurrence and radiation treatment.  Later a rising PSA.  Referred for Lupron beginning May 2002. Treatment 5/02 to 5/03. Drug holiday 5/03 to 3/04. Treatment 3/04 to 2/05. Drug holiday 2/05. Restarted 7/05, off again , last dose 01/2010, then on and off again, se earlier notes,  resumed 06/2012.  2.  Previous surgeries include umbilical hernia repair and lysing of adhesions, inguinal hernia repair then breakdown and recurrence.  3.  Hyperlipidemia.  4.  History of cardiac arrhythmia ventricular beats evaluated in the past by Dr. Clayborn Bigness.    5.  Osteoporosis. 6 right upper lobe lung nodule is positive for non-small cell carcinoma of lung stage I disease     Prostate cancer [facility])    PAST MEDICAL HISTORY: Past Medical History  Diagnosis Date  . Heart murmur   . COPD (chronic obstructive pulmonary disease) ([place])   . High blood cholesterol level   . Irritable bowel syndrome   . Diabetes mellitus without complication ([place])   . Osteoporosis   . Incontinence   . Paroxysmal a-fib ([place])   . Cancer [facility]) 1994    prostate, then lung  . Pneumonia   . Hypertension   . Chronic kidney disease     KIDNEY STONES  . GERD (gastroesophageal reflux disease)   . On home oxygen therapy     2L [place] PRN    PAST SURGICAL HISTORY: Past Surgical History  Procedure Laterality Date  . Colonoscopy  03/12/13  . Prostate surgery  1994  . Hernia repair  2011  . Surgery for staph infection  1995  . Colonoscopy  2011  . Bone density test      Spine T-spine = -1.7  . Electromagnetic navigation brochoscopy N/A 01/07/2015    Procedure: ELECTROMAGNETIC NAVIGATION BRONCHOSCOPY;  Surgeon: Vilinda Boehringer, MD;  Location: [facility] ORS;  Service: Cardiopulmonary;  Laterality: N/A;    FAMILY HISTORY Family History  Problem Relation Age of Onset  . Cancer Father      bladder  . Other Sister     brain tumor  . Cancer Brother     colon  . Asthma Mother   . Osteoporosis Mother     GYNECOLOGIC HISTORY:  No LMP for male patient.     ADVANCED DIRECTIVES:    HEALTH MAINTENANCE: Social History  Substance Use Topics  . Smoking status: Former Smoker -- 1.00 packs/day    Types: Cigarettes    Quit date: 05/15/2006  . Smokeless tobacco: Never Used  . Alcohol Use: No   No Known Allergies  Current Outpatient Prescriptions  Medication Sig Dispense Refill  . albuterol (PROVENTIL HFA;VENTOLIN HFA) 108 (90 BASE) MCG/ACT inhaler Inhale 2 puffs into the lungs every 6 (six) hours as needed for wheezing or shortness of breath.     [name] alendronate (FOSAMAX) 70 MG tablet TAKE ONE TABLET BY MOUTH ONCE A WEEK 12 tablet 4  . aspirin EC 81 MG tablet Take 162 mg by mouth every 4 (four) hours as needed for mild pain.     . Calcium Carbonate-Vitamin D (CALCIUM 600+D) 600-400 MG-UNIT tablet Take 1 tablet by mouth 2 (two) times daily.    . cholecalciferol (VITAMIN D) 1000 UNITS tablet Take 2,000 Units by mouth daily.     . Cranberry-Vitamin C-Probiotic (AZO CRANBERRY) 250-30 MG TABS Take 2 tablets by mouth every evening.    . Fluticasone-Salmeterol (ADVAIR DISKUS) 500-50 MCG/DOSE AEPB Inhale 1 puff into the lungs 2 (two) times daily. 60 each 5  . ipratropium-albuterol (DUONEB) 0.5-2.5 (3) MG/3ML SOLN USE ONE VIAL IN NEBULIZER 4 TIMES DAILY 1080 mL 4  . Leuprolide Acetate (LUPRON IJ) Inject 1 Dose as directed every 3 (three) months.     . metFORMIN (GLUCOPHAGE) 500 MG tablet TAKE ONE TABLET BY MOUTH ONCE DAILY 30 tablet 3  . metoprolol tartrate (LOPRESSOR) 25 MG tablet Take 1 tablet (25 mg total) by mouth 2 (two) times daily. 60 tablet 6  . Omega-3 Fatty Acids (FISH OIL) 1000 MG CAPS Take 2,000 mg by mouth daily.     . pravastatin (PRAVACHOL) 40 MG tablet Take 40 mg by mouth daily.     . predniSONE (DELTASONE) 20 MG tablet Take 1 tablet (20 mg total) by mouth daily with  breakfast. 30 tablet 2  . tiotropium (SPIRIVA) 18 MCG inhalation capsule Place 18 mcg into inhaler and inhale daily.     No current facility-administered medications for this visit.    OBJECTIVE: BP 120/68 mmHg  Pulse 81  Temp(Src) 98.4 F (36.9 C) (Tympanic)  Wt 162 lb 0.6 oz (73.5 kg)   Body mass index is 21.97 kg/(m^2).    ECOG FS:2 - Symptomatic, <50% confined to bed  General: Well-developed, well-nourished, no acute distress. Sitting comfortably in wheelchair. Eyes: Pink conjunctiva, anicteric sclera. HEENT: Normocephalic, moist mucous membranes, clear oropharnyx. Lungs: Lung sounds diminished bilaterally, using 2 L of oxygen via nasal cannula. Heart: Regular rate and rhythm. No rubs, murmurs, or gallops. Abdomen: Soft, nontender, nondistended. No organomegaly noted, normoactive bowel sounds. Musculoskeletal: No edema, cyanosis, or clubbing. Neuro: Alert, answering all questions appropriately. Cranial nerves grossly intact. Skin: No rashes  or petechiae noted. Psych: Normal affect.   LAB RESULTS:  Appointment on 09/20/2015  Component Date Value Ref Range Status  . WBC 09/20/2015 12.9* 3.8 - 10.6 K/uL Final  . RBC 09/20/2015 4.52  4.40 - 5.90 MIL/uL Final  . Hemoglobin 09/20/2015 13.6  13.0 - 18.0 g/dL Final  . HCT 09/20/2015 40.8  40.0 - 52.0 % Final  . MCV 09/20/2015 90.3  80.0 - 100.0 fL Final  . MCH 09/20/2015 30.2  26.0 - 34.0 pg Final  . MCHC 09/20/2015 33.4  32.0 - 36.0 g/dL Final  . RDW 09/20/2015 15.8* 11.5 - 14.5 % Final  . Platelets 09/20/2015 252  150 - 440 K/uL Final  . Neutrophils Relative % 09/20/2015 90   Final  . Neutro Abs 09/20/2015 11.5* 1.4 - 6.5 K/uL Final  . Lymphocytes Relative 09/20/2015 6   Final  . Lymphs Abs 09/20/2015 0.8* 1.0 - 3.6 K/uL Final  . Monocytes Relative 09/20/2015 4   Final  . Monocytes Absolute 09/20/2015 0.5  0.2 - 1.0 K/uL Final  . Eosinophils Relative 09/20/2015 0   Final  . Eosinophils Absolute 09/20/2015 0.0  0 - 0.7 K/uL  Final  . Basophils Relative 09/20/2015 0   Final  . Basophils Absolute 09/20/2015 0.0  0 - 0.1 K/uL Final  . Sodium 09/20/2015 138  135 - 145 mmol/L Final  . Potassium 09/20/2015 4.2  3.5 - 5.1 mmol/L Final  . Chloride 09/20/2015 98* 101 - 111 mmol/L Final  . CO2 09/20/2015 34* 22 - 32 mmol/L Final  . Glucose, Bld 09/20/2015 165* 65 - 99 mg/dL Final  . BUN 09/20/2015 19  6 - 20 mg/dL Final  . Creatinine, Ser 09/20/2015 0.95  0.61 - 1.24 mg/dL Final  . Calcium 09/20/2015 9.2  8.9 - 10.3 mg/dL Final  . Total Protein 09/20/2015 6.8  6.5 - 8.1 g/dL Final  . Albumin 09/20/2015 3.9  3.5 - 5.0 g/dL Final  . AST 09/20/2015 17  15 - 41 U/L Final  . ALT 09/20/2015 15* 17 - 63 U/L Final  . Alkaline Phosphatase 09/20/2015 45  38 - 126 U/L Final  . Total Bilirubin 09/20/2015 0.9  0.3 - 1.2 mg/dL Final  . GFR calc non Af Amer 09/20/2015 >60  >60 mL/min Final  . GFR calc Af Amer 09/20/2015 >60  >60 mL/min Final   Comment: (NOTE) The eGFR has been calculated using the CKD EPI equation. This calculation has not been validated in all clinical situations. eGFR's persistently <60 mL/min signify possible Chronic Kidney Disease.   . Anion gap 09/20/2015 6  5 - 15 Final    STUDIES: No results found.  ASSESSMENT:  Carcinoma of prostate, status post original radical prostatectomy. Right upper lobe carcinoma of lung, stage I.  PLAN:   1. Prostate cancer. Patient is currently receiving Lupron for recurrent disease every 4 months. His most recent PSA was 0.24. PSA from today is pending. We'll proceed with Lupron injection as previously planned. 2. Right upper lobe lung cancer. Patient continues with progressive end-stage COPD and is now on oxygen therapy with 2 L via nasal cannula 24 hours a day. Patient overall is very poor candidate for aggressive treatment. Patient has completed stereotactic radiation therapy. Plan is for patient to have another CT scan prior to his next follow-up appointment with Dr.  Baruch Gouty.  Patient to return to clinic for further treatment of prostate cancer in approximately 4 months.  Patient expressed understanding and was in agreement with this plan.  He also understands that He can call clinic at any time with any questions, concerns, or complaints.   Dr. Oliva Bustard was available for consultation and review of plan of care for this patient.  Prostate cancer [facility])   Staging form: Prostate, AJCC 7th Edition     Clinical: T1b, N0, M0 - Unsigned   Evlyn Kanner, NP   09/20/2015 10:56 AM

## 2015-10-01 ENCOUNTER — Other Ambulatory Visit: Payer: Self-pay | Admitting: Family Medicine

## 2015-10-04 ENCOUNTER — Encounter: Payer: Self-pay | Admitting: Cardiology

## 2015-10-04 ENCOUNTER — Ambulatory Visit (INDEPENDENT_AMBULATORY_CARE_PROVIDER_SITE_OTHER): Payer: PPO | Admitting: Cardiology

## 2015-10-04 ENCOUNTER — Other Ambulatory Visit: Payer: Self-pay | Admitting: Internal Medicine

## 2015-10-04 ENCOUNTER — Telehealth: Payer: Self-pay | Admitting: Internal Medicine

## 2015-10-04 VITALS — BP 140/60 | HR 68 | Ht 72.0 in | Wt 162.8 lb

## 2015-10-04 DIAGNOSIS — I493 Ventricular premature depolarization: Secondary | ICD-10-CM | POA: Diagnosis not present

## 2015-10-04 DIAGNOSIS — J449 Chronic obstructive pulmonary disease, unspecified: Secondary | ICD-10-CM

## 2015-10-04 DIAGNOSIS — R0600 Dyspnea, unspecified: Secondary | ICD-10-CM

## 2015-10-04 DIAGNOSIS — E785 Hyperlipidemia, unspecified: Secondary | ICD-10-CM

## 2015-10-04 DIAGNOSIS — I10 Essential (primary) hypertension: Secondary | ICD-10-CM | POA: Diagnosis not present

## 2015-10-04 MED ORDER — METOPROLOL TARTRATE 25 MG PO TABS: 25.0000 mg | ORAL_TABLET | Freq: Three times a day (TID) | ORAL | Status: DC

## 2015-10-04 NOTE — Telephone Encounter (Signed)
Please advise if pt is taking Azithromycin daily or was last rx for 1 month. Thanks

## 2015-10-04 NOTE — Progress Notes (Signed)
Cardiology Office Note   Date:  10/04/2015   ID:  Alan Weaver, DOB 1927-02-01, MRN [id number]  Referring Doctor:  Lelon Huh, MD   Cardiologist:   Wende Bushy, MD   Reason for consultation:  Chief Complaint  Patient presents with  . Follow-up    SOB with activity      History of Present Illness: Alan Weaver is a 82 y.o. male who presents for Shortness of breath. Patient has long-standing history of severe emphysema/COPD, chronic oxygen therapy, non-small cell lung cancer, prostate cancer. He has shortness of breath going on for many years now slightly worse over the years, shortness breath with minimal exertion. Able to do some chores at home, needing a stool to sit down and rest. Shortness of breath is precipitated by minimal exertion. Chronic shortness of breath. No chest pains, nausea, diaphoresis.  Per patient, SOB is overall unchanged, maybe a little worse over the last couple of months.   Denies palpitations, CP, headache, cough, colds, abdominal pain, orthopnea, PND. Pt reports occasional ankle swelling noted at the end of the day.     ROS:  Please see the history of present illness. Aside from mentioned under HPI, all other systems are reviewed and negative.     Past Medical History  Diagnosis Date  . Heart murmur   . COPD (chronic obstructive pulmonary disease) ([place])   . High blood cholesterol level   . Irritable bowel syndrome   . Diabetes mellitus without complication ([place])   . Osteoporosis   . Incontinence   . Paroxysmal a-fib ([place])   . Cancer [facility]) 1994    prostate, then lung  . Pneumonia   . Hypertension   . Chronic kidney disease     KIDNEY STONES  . GERD (gastroesophageal reflux disease)   . On home oxygen therapy     2L [place] PRN  Patient unaware of atrial fibrillation history. He has never been on anticoagulation. Awaiting records from previous cardiologist.  Past Surgical History  Procedure Laterality Date  . Colonoscopy   03/12/13  . Prostate surgery  1994  . Hernia repair  2011  . Surgery for staph infection  1995  . Colonoscopy  2011  . Bone density test      Spine T-spine = -1.7  . Electromagnetic navigation brochoscopy N/A 01/07/2015    Procedure: ELECTROMAGNETIC NAVIGATION BRONCHOSCOPY;  Surgeon: Vilinda Boehringer, MD;  Location: [facility] ORS;  Service: Cardiopulmonary;  Laterality: N/A;     reports that he quit smoking about 9 years ago. His smoking use included Cigarettes. He smoked 1.00 pack per day. He has never used smokeless tobacco. He reports that he does not drink alcohol or use illicit drugs.   family history includes Asthma in his mother; Cancer in his brother and father; Osteoporosis in his mother; Other in his sister.   Current Outpatient Prescriptions  Medication Sig Dispense Refill  . albuterol (PROVENTIL HFA;VENTOLIN HFA) 108 (90 BASE) MCG/ACT inhaler Inhale 2 puffs into the lungs every 6 (six) hours as needed for wheezing or shortness of breath.     [name] alendronate (FOSAMAX) 70 MG tablet TAKE ONE TABLET BY MOUTH ONCE A WEEK 12 tablet 4  . aspirin EC 81 MG tablet Take 162 mg by mouth every 4 (four) hours as needed for mild pain.     . Calcium Carbonate-Vitamin D (CALCIUM 600+D) 600-400 MG-UNIT tablet Take 1 tablet by mouth 2 (two) times daily.    . cholecalciferol (VITAMIN D) 1000  UNITS tablet Take 2,000 Units by mouth daily.     . Cranberry-Vitamin C-Probiotic (AZO CRANBERRY) 250-30 MG TABS Take 2 tablets by mouth every evening.    . Fluticasone-Salmeterol (ADVAIR DISKUS) 500-50 MCG/DOSE AEPB Inhale 1 puff into the lungs 2 (two) times daily. 60 each 5  . ipratropium-albuterol (DUONEB) 0.5-2.5 (3) MG/3ML SOLN USE ONE VIAL IN NEBULIZER 4 TIMES DAILY 1080 mL 4  . Leuprolide Acetate (LUPRON IJ) Inject 1 Dose as directed every 3 (three) months.     . metFORMIN (GLUCOPHAGE) 500 MG tablet TAKE ONE TABLET BY MOUTH ONCE DAILY 30 tablet 3  . Omega-3 Fatty Acids (FISH OIL) 1000 MG CAPS Take 2,000 mg by  mouth daily.     . pravastatin (PRAVACHOL) 40 MG tablet Take 40 mg by mouth daily.     . predniSONE (DELTASONE) 20 MG tablet Take 1 tablet (20 mg total) by mouth daily with breakfast. 30 tablet 2  . tiotropium (SPIRIVA) 18 MCG inhalation capsule Place 18 mcg into inhaler and inhale daily.    [name] azithromycin (ZITHROMAX) 250 MG tablet TAKE ONE TABLET BY MOUTH ONCE DAILY 30 tablet 0  . metoprolol tartrate (LOPRESSOR) 25 MG tablet Take 1 tablet (25 mg total) by mouth 3 (three) times daily. 270 tablet 6   No current facility-administered medications for this visit.   Patient previously on metoprolol. This was discontinued while he was in the hospital for low blood pressure.  Allergies: Review of patient's allergies indicates no known allergies.    PHYSICAL EXAM: VS:  BP 140/60 mmHg  Pulse 68  Ht 6' (1.829 m)  Wt 162 lb 12.8 oz (73.846 kg)  BMI 22.07 kg/m2  SpO2 82% , Body mass index is 22.07 kg/(m^2). Wt Readings from Last 3 Encounters:  10/04/15 162 lb 12.8 oz (73.846 kg)  09/20/15 162 lb 0.6 oz (73.5 kg)  09/13/15 163 lb 9.6 oz (74.208 kg)    GENERAL:  well developed, well nourished, in mild respiratory distress HEENT: normocephalic, pink conjunctivae, anicteric sclerae, no xanthelasma, normal dentition, oropharynx clear NECK:  no neck vein engorgement, JVP normal, no hepatojugular reflux, carotid upstroke brisk and symmetric, no bruit, no thyromegaly, no lymphadenopathy LUNGS:  Fair respiratory effort, pursed lip breathing, diminished breath sounds bilaterally, rhonchi diffusely CV:  PMI not displaced, no thrills, no lifts, S1 and S2 within normal limits, no palpable S3 or S4, no murmurs, no rubs, no gallops ABD:  Soft, nontender, nondistended, normoactive bowel sounds, no abdominal aortic bruit, no hepatomegaly, no splenomegaly MS: nontender back, positive kyphosis, no scoliosis, no joint deformities EXT:  2+ DP/PT pulses, trace edema, no varicosities, no cyanosis, no clubbing SKIN:  warm, nondiaphoretic, normal turgor, no ulcers NEUROPSYCH: alert, oriented to person, place, and time, sensory/motor grossly intact, normal mood, appropriate affect    Other studies Reviewed:  EKG:   The ekg 09/02/2015 ordered today was personally reviewed by me and it reveals sinus tachycardia, 101 BPM, frequent PVCs, right atrial enlargement.  EKG from 10/04/2015 was personally reviewed by me and it showed sinus rhythm, 99 BPM,  sinus arrhythmia, occasional PVCs. Poor R-wave progression  Additional studies/ records that were reviewed personally reviewed by me today include:  Echo 07/14/2015: LVEF 50-55%. Abnormal left ventricular relaxation. Grade 1 diastolic dysfunction. PA pressure 36 mmHg.  ASSESSMENT AND PLAN:  -Shortness of breath  Patient has significant lung pathology including severe emphysema/COPD, chronic oxygen therapy, 2 L of oxygen every day, non-small cell lung cancer on the right lung. Echocardiogram from 07/14/2015 shows  preserved ejection fraction and abnormal left ventricular relaxation. There may be some element of mild diastolic dysfunction that may be contributing minimally to patient's shortness of breath. Ideally, further investigation with stress testing can be done. However, based on medical records that are reviewed, prognosis due to patient's pulmonary condition is poor. This was discussed in detail with the patient and his daughter. Patient does not wish to undergo any invasive testing. Therefore, defer stress testing for now. Rec medical therapy.  Amlodipine discontinued last time. Continue metoprolol. Cont asa, statin.  Pt to ffup with pulmonary. O2sat was noted to decrease to 82% briefly with movement.   Possible CHF, diastolic dysfunction, chronic Medical therapy as above  HTN BP is well controlled. Continue monitoring BP. Continue current medical therapy and lifestyle changes. Monitor bp with change in meds.  Hyperlipidemia Cont statin  therapy  pvcs Cont metoprolol. Increase to '25mg'$  tid. Cont to monitor.  Sinus tachycardia Likely due to severe emphysema   Current medicines are reviewed at length with the patient today.  The patient does not have concerns regarding medicines.  Labs/ tests ordered today include:  Orders Placed This Encounter  Procedures  . EKG 12-Lead    I had a lengthy and detailed discussion with the patient regarding diagnoses, prognosis, diagnostic options, treatment options, and side effects of medications.     Disposition:   FU with undersigned in 3 months   Signed, Wende Bushy, MD  10/04/2015 2:39 PM    [place]

## 2015-10-04 NOTE — Patient Instructions (Signed)
Medication Instructions:  Your physician has recommended you make the following change in your medication:  1. Increase metoprolol 25 mg to 3 times daily   Labwork: None ordered  Testing/Procedures: None ordered  Follow-Up: Your physician recommends that you schedule a follow-up appointment in: 3 months with Dr. Yvone Neu  Date & Time: _______________________________________________________________   Any Other Special Instructions Will Be Listed Below (If Applicable).     If you need a refill on your cardiac medications before your next appointment, please call your pharmacy.

## 2015-10-04 NOTE — Telephone Encounter (Signed)
Patient recently called in for refills on his pulmonology medications. He would like them called in for 90 days at [facility] on [address].

## 2015-10-04 NOTE — Telephone Encounter (Signed)
He needs Long term azithromycin 250 daily

## 2015-10-04 NOTE — Telephone Encounter (Signed)
Alan Weaver has sent in rx. Nothing further needed.

## 2015-10-18 ENCOUNTER — Other Ambulatory Visit: Payer: Self-pay | Admitting: Internal Medicine

## 2015-11-01 ENCOUNTER — Other Ambulatory Visit: Payer: Self-pay | Admitting: Internal Medicine

## 2015-11-05 ENCOUNTER — Encounter: Payer: Self-pay | Admitting: Internal Medicine

## 2015-11-05 ENCOUNTER — Ambulatory Visit (INDEPENDENT_AMBULATORY_CARE_PROVIDER_SITE_OTHER): Payer: PPO | Admitting: Internal Medicine

## 2015-11-05 VITALS — BP 134/60 | HR 82 | Ht 72.0 in | Wt 160.0 lb

## 2015-11-05 DIAGNOSIS — J9611 Chronic respiratory failure with hypoxia: Secondary | ICD-10-CM | POA: Diagnosis not present

## 2015-11-05 NOTE — Patient Instructions (Signed)
Chronic Respiratory Failure Respiratory failure is when your lungs are not working well and your breathing (respiratory) system fails. When respiratory failure occurs, it is difficult for your lungs to get enough oxygen or get rid of carbon dioxide or both. Respiratory failure can be life threatening.  Respiratory failure can be acute or chronic. Acute respiratory failure is sudden, severe, and requires emergency medical treatment. Chronic respiratory failure is less severe, happens over time, and requires ongoing treatment.  CAUSES  Any problem affecting the heart or lungs can cause respiratory failure. Some of these causes may be:  Chronic bronchitis and emphysema (COPD).  Blood clot going to the lung (pulmonary embolism).  Having water in the lungs caused by heart failure, lung injury, or infection (pulmonary edema).  Collapsed lung (pneumothorax).  Pneumonia.  Pulmonary fibrosis.  Obesity.  Asthma.  Heart failure.  Any type of trauma to the chest that can make breathing difficult.  Nerve or muscle diseases making chest movements difficult. SYMPTOMS  Signs and symptoms of chronic respiratory failure include:  Shortness of breath (dyspnea) with or without activity.  Rapid, fast breathing (tachypnea).  Wheezing.  Fast heart rate.  Bluish color to the fingernail or toenail beds.  Confusion or drowsiness or both. DIAGNOSIS  Initial diagnosis requires a thorough history and a physical exam by your health care provider. Additional tests may include:  Chest X-ray.  CT scan of your lungs.  Ultrasound to check for blood clots.  Blood tests, such as an arterial blood gas test (ABG). This is a blood test that looks at the oxygen and carbon dioxide levels in your arterial blood.  Your vital signs will be taken. This includes your respiratory rate (how many times a minute you are breathing), oxygen saturation (this measures the oxygen level in your blood), heart rate, and  blood pressure. These numbers help your health care provider determine the next steps.  Electrocardiogram. TREATMENT  Treatment of chronic respiratory failure depends on the cause of the respiratory failure. Treatment can include the following:  Oxygen. Oxygen can be delivered through the following:  Nasal cannula. This is small tubing that goes in your nose to give you oxygen.  Face mask. A face mask covers your nose and mouth to give you oxygen.  Medicine. Different medicines can be given to help with breathing. These can include:  Nebulizers. Nebulizers deliver medicines to open the air passages (bronchodilators). These medicines help to open or relax the airways in the lungs so you can breathe better. They can also help loosen mucus from your lungs.  Diuretics. Diuretic medicines can help you breathe better by getting rid of extra fluid in your body.  Steroids. Steroid medicines can help decrease inflammation in your lungs.  Chest tube. If you have a collapsed lung (pneumothorax), a chest tube is placed to help reinflate the lung.  Noninvasive positive pressure ventilation (NPPV). This is a tight-fitting mask that goes over your nose and mouth. The mask has tubing that is attached to a machine. The machine blows air into the tubing, which helps to keep the tiny air sacs (alveoli) in your lungs open. This machine allows you to breathe on your own.  Ventilator. A ventilator is a breathing machine. When on a ventilator, a breathing tube is put into the lungs. A ventilator is used when you can no longer breathe well enough on your own. You may have low oxygen levels or high carbon dioxide (CO2) levels in your blood. When you are on a  ventilator, sedation and pain medicines are given to make you sleep so your lungs can heal. HOME CARE INSTRUCTIONS  Follow your health care provider's directions about medicines and respiratory therapy.  Quit smoking if you smoke. SEEK MEDICAL CARE  IF:  You have increasing shortness of breath and are less functional than you have been.  You have increased sputum, wheezing, coughing, or loss of energy.  You are on oxygen and are requiring more. SEEK IMMEDIATE MEDICAL CARE IF:  Your shortness of breath is significantly worse.  You are unable to say more than a few words without having to catch your breath.  You are much less functional. MAKE SURE YOU:  Understand these instructions.  Will watch your condition.  Will get help right away if you are not doing well or get worse.   This information is not intended to replace advice given to you by your health care provider. Make sure you discuss any questions you have with your health care provider.   Document Released: 06/12/2005 Document Revised: 03/03/2015 Document Reviewed: 04/10/2013 Elsevier Interactive Patient Education [employer].

## 2015-11-05 NOTE — Progress Notes (Signed)
MRN# [id number] Alan Weaver Jun 12, 1927   CC: chornic SOB, DOE, follow up COPD, lung cancer   HPI:  Patient with COPD-DOE and SOB with exertion  Currently on  prednisone  20 mg daily On advair and spiriva, on daily azithromycin  Completed SBRT on 02/22/15 for RUL mass. Still with persistent gradual increase with DOE. Uses oxygen with exertion No signs of infection at this time, ONO reveals  he needs oxygen at night Last ECHO in may 2016 showed grade 1 diastolic dysfunction, current January 2017 ECHO shows EF 50% and grade 1 Diastolic dys Repeat CT chest and follow up rad oncology in 2 months   Review of Systems: Gen:  Denies  fever, sweats, chills HEENT: Denies blurred vision, double vision, ear pain, eye pain, hearing loss, nose bleeds, sore throat Cvc:  No dizziness, chest pain or heaviness Resp:   Admits KP:QAESLPN sob and sputum production, DOE Ext:   No Joint pain, stiffness or swelling Other:  All other systems negative  Allergies:  Review of patient's allergies indicates no known allergies.  Physical Examination:  VS: BP 134/60 mmHg  Pulse 82  Ht 6' (1.829 m)  Wt 160 lb (72.576 kg)  BMI 21.70 kg/m2  SpO2 90%  General Appearance: minimal distress  HEENT: PERRLA, no ptosis, no other lesions noticed, pursed lip breathing Pulmonary:distant  breath sounds., diaphragmatic excursion normal.No wheezing, No rales , mild dec in BS at the bases.  Cardiovascular:  Normal S1,S2.  No m/r/g.     Abdomen:Exam: Benign, Soft, non-tender, No masses  Skin:   warm, no rashes, no ecchymosis  Extremities: normal, no cyanosis, +edema  Biopsy result from transbronch forcepts 01/07/15 DIAGNOSIS:  A. LUNG, RIGHT UPPER LOBE; ENB FORCEPS BIOPSY:  - NON-SMALL CELL CARCINOMA, FAVOR SQUAMOUS CELL CARCINOMA.     Assessment and Plan:82 yo with COPD end stage with chronic resp insufficiency, s\p bronch with biopsy for RUL mass, found to be NSCLC , Patient with chronic worsening of SOB/DOE.  Patient with severe end stage COPD and resp insufficiency  1.continuie  Advair and Spiriva, will check proper technique and usage, advised to rinse mouth after each use 2.continue oxygen as prescribed  3.cont azithromycin daily to prevent acute COPD exacerbations 4.continue dounebs as prescribed 5.continue prednisone as prescribed 6.follow up cardiology referral  7.follow up rad oncology in 2 months with repeat CT chest   Follow up in 3 months  Prognosis is  poor, poor quality of life with resp status.  The Patient requires high complexity decision making for assessment and support, frequent evaluation and titration of therapies. Patient/Family are satisfied with Plan of action and management. All questions answered  Corrin Parker, M.D.  [facility]  Medical Director [place] Director [facility] Cardio-Pulmonary Department

## 2015-11-18 ENCOUNTER — Other Ambulatory Visit: Payer: Self-pay | Admitting: Family Medicine

## 2015-11-18 ENCOUNTER — Other Ambulatory Visit: Payer: Self-pay | Admitting: *Deleted

## 2015-11-18 MED ORDER — METOPROLOL TARTRATE 25 MG PO TABS: 25.0000 mg | ORAL_TABLET | Freq: Three times a day (TID) | ORAL | Status: AC

## 2015-11-18 NOTE — Telephone Encounter (Signed)
Requested Prescriptions   Signed Prescriptions Disp Refills  . metoprolol tartrate (LOPRESSOR) 25 MG tablet 90 tablet 3    Sig: Take 1 tablet (25 mg total) by mouth 3 (three) times daily.    Authorizing Provider: Wende Bushy    Ordering User: Britt Bottom

## 2015-11-21 ENCOUNTER — Other Ambulatory Visit: Payer: Self-pay | Admitting: Internal Medicine

## 2015-12-06 ENCOUNTER — Other Ambulatory Visit: Payer: Self-pay | Admitting: Internal Medicine

## 2015-12-13 ENCOUNTER — Ambulatory Visit (INDEPENDENT_AMBULATORY_CARE_PROVIDER_SITE_OTHER): Payer: PPO | Admitting: Family Medicine

## 2015-12-13 ENCOUNTER — Encounter: Payer: Self-pay | Admitting: Family Medicine

## 2015-12-13 VITALS — BP 132/60 | Temp 98.1°F | Resp 16 | Wt 162.0 lb

## 2015-12-13 DIAGNOSIS — C61 Malignant neoplasm of prostate: Secondary | ICD-10-CM | POA: Diagnosis not present

## 2015-12-13 DIAGNOSIS — I10 Essential (primary) hypertension: Secondary | ICD-10-CM

## 2015-12-13 DIAGNOSIS — E782 Mixed hyperlipidemia: Secondary | ICD-10-CM | POA: Diagnosis not present

## 2015-12-13 DIAGNOSIS — R918 Other nonspecific abnormal finding of lung field: Secondary | ICD-10-CM

## 2015-12-13 DIAGNOSIS — E1142 Type 2 diabetes mellitus with diabetic polyneuropathy: Secondary | ICD-10-CM

## 2015-12-13 MED ORDER — METFORMIN HCL 500 MG PO TABS: 500.0000 mg | ORAL_TABLET | Freq: Two times a day (BID) | ORAL | Status: AC

## 2015-12-13 NOTE — Progress Notes (Signed)
Patient ID: ORBY TANGEN, male   DOB: 16-Feb-1927, 82 y.o.   MRN: [id number]       Patient: Alan Weaver Male    DOB: 1926-11-05   82 y.o.   MRN: [id number] Visit Date: 12/13/2015  Today's Provider: Vernie Murders, PA   Chief Complaint  Patient presents with  . Diabetes  . Hyperlipidemia  . Hypertension   Subjective:    HPI  Diabetes Mellitus Type II, Follow-up:   Lab Results  Component Value Date   HGBA1C 7.8* 09/10/2015   HGBA1C 7.0* 06/20/2015   HGBA1C 6.4 05/13/2015   Last seen for diabetes 3 months ago.  Management since then includes increasing Metformin to twice daily. He reports good compliance with treatment. He is not having side effects.  Current symptoms include none and have been stable. Home blood sugar records: trend: fluctuating a bit  Episodes of hypoglycemia? no   Weight trend: stable Prior visit with dietician: no Current diet: well balanced Current exercise: none  ------------------------------------------------------------------------   Hypertension, follow-up:  BP Readings from Last 3 Encounters:  12/13/15 132/60  11/05/15 134/60  10/04/15 140/60    He was last seen for hypertension 3 months ago.  BP at that visit was 134/60. Management since that visit includes no change .He reports good compliance with treatment. He is not having side effects.  He is not exercising. He is not adherent to low salt diet.   Outside blood pressures are checked occasionally. Patient denies chest pain, fatigue, palpitations and tachypnea.   Cardiovascular risk factors include diabetes mellitus and dyslipidemia.   ------------------------------------------------------------------------    Lipid/Cholesterol, Follow-up:   Last seen for this 3 months ago.  Management since that visit includes no changes.  Last Lipid Panel:    Component Value Date/Time   CHOL 234* 09/10/2015 1057   TRIG 123 09/10/2015 1057   HDL 85 09/10/2015 1057   CHOLHDL 2.8 09/10/2015 1057   LDLCALC 124* 09/10/2015 1057    He reports good compliance with treatment. He is not having side effects.   Wt Readings from Last 3 Encounters:  12/13/15 162 lb (73.483 kg)  11/05/15 160 lb (72.576 kg)  10/04/15 162 lb 12.8 oz (73.846 kg)    ------------------------------------------------------------------------ Past Medical History  Diagnosis Date  . Heart murmur   . COPD (chronic obstructive pulmonary disease) ([place])   . High blood cholesterol level   . Irritable bowel syndrome   . Diabetes mellitus without complication ([place])   . Osteoporosis   . Incontinence   . Paroxysmal a-fib ([place])   . Cancer [facility]) 1994    prostate, then lung  . Pneumonia   . Hypertension   . Chronic kidney disease     KIDNEY STONES  . GERD (gastroesophageal reflux disease)   . On home oxygen therapy     2L [place] PRN   Past Surgical History  Procedure Laterality Date  . Colonoscopy  03/12/13  . Prostate surgery  1994  . Hernia repair  2011  . Surgery for staph infection  1995  . Colonoscopy  2011  . Bone density test      Spine T-spine = -1.7  . Electromagnetic navigation brochoscopy N/A 01/07/2015    Procedure: ELECTROMAGNETIC NAVIGATION BRONCHOSCOPY;  Surgeon: Vilinda Boehringer, MD;  Location: [facility] ORS;  Service: Cardiopulmonary;  Laterality: N/A;   Family History  Problem Relation Age of Onset  . Cancer Father     bladder  . Other Sister     brain tumor  .  Cancer Brother     colon  . Asthma Mother   . Osteoporosis Mother    No Known Allergies   Current Meds  Medication Sig  . albuterol (PROVENTIL HFA;VENTOLIN HFA) 108 (90 BASE) MCG/ACT inhaler Inhale 2 puffs into the lungs every 6 (six) hours as needed for wheezing or shortness of breath.   [name] alendronate (FOSAMAX) 70 MG tablet TAKE ONE TABLET BY MOUTH ONCE A WEEK  . aspirin EC 81 MG tablet Take 162 mg by mouth every 4 (four) hours as needed for mild pain.   [name] azithromycin (ZITHROMAX) 250 MG tablet TAKE ONE  TABLET BY MOUTH ONCE DAILY  . Calcium Carbonate-Vitamin D (CALCIUM 600+D) 600-400 MG-UNIT tablet Take 1 tablet by mouth 2 (two) times daily.  . cholecalciferol (VITAMIN D) 1000 UNITS tablet Take 2,000 Units by mouth daily.   . Cranberry-Vitamin C-Probiotic (AZO CRANBERRY) 250-30 MG TABS Take 2 tablets by mouth every evening.  . Fluticasone-Salmeterol (ADVAIR DISKUS) 500-50 MCG/DOSE AEPB Inhale 1 puff into the lungs 2 (two) times daily.  [name] ipratropium-albuterol (DUONEB) 0.5-2.5 (3) MG/3ML SOLN USE ONE VIAL IN NEBULIZER 4 TIMES DAILY  . Leuprolide Acetate (LUPRON IJ) Inject 1 Dose as directed every 3 (three) months.   . metFORMIN (GLUCOPHAGE) 500 MG tablet TAKE ONE TABLET BY MOUTH ONCE DAILY  . metoprolol tartrate (LOPRESSOR) 25 MG tablet Take 1 tablet (25 mg total) by mouth 3 (three) times daily.  . Omega-3 Fatty Acids (FISH OIL) 1000 MG CAPS Take 2,000 mg by mouth daily.   . pravastatin (PRAVACHOL) 40 MG tablet Take 40 mg by mouth daily.   . predniSONE (DELTASONE) 20 MG tablet TAKE ONE TABLET BY MOUTH ONCE DAILY WITH BREAKFAST  . tiotropium (SPIRIVA) 18 MCG inhalation capsule Place 18 mcg into inhaler and inhale daily.    Review of Systems  Constitutional: Negative.   Respiratory: Positive for shortness of breath.        Patient has COPD.   Cardiovascular: Negative.   Musculoskeletal: Negative.     Social History  Substance Use Topics  . Smoking status: Former Smoker -- 1.00 packs/day    Types: Cigarettes    Quit date: 05/15/2006  . Smokeless tobacco: Never Used  . Alcohol Use: No   Objective:   BP 132/60 mmHg  Temp(Src) 98.1 F (36.7 C)  Resp 16  Wt 162 lb (73.483 kg)  Physical Exam  Constitutional: He is oriented to person, place, and time. He appears well-developed and well-nourished.  HENT:  Head: Normocephalic.  Right Ear: External ear normal.  Left Ear: External ear normal.  Nose: Nose normal.  Mouth/Throat: Oropharynx is clear and moist.  Eyes: Conjunctivae and  EOM are normal.  Neck: Neck supple.  Cardiovascular: Normal rate and normal heart sounds.   Pulmonary/Chest:  Slightly labored breathing with distant breath sounds. No rales or rhonchi.  Abdominal: Bowel sounds are normal.  Lymphadenopathy:    He has no cervical adenopathy.  Neurological: He is alert and oriented to person, place, and time.  Skin: Skin is warm and dry. There is pallor.      Assessment & Plan:     1. DM type 2 with diabetic peripheral neuropathy (HCC) Tolerating increase in Metformin to 500 mg BID without hypoglycemia. Difficulty maintaining control due to increase in prednisone and lung cancer with radiation treatments. Will recheck labs and continue present diet. Recheck in 3 months. - Comprehensive metabolic panel - CBC with Differential/Platelet - Hemoglobin A1c - Lipid panel - metFORMIN (GLUCOPHAGE) 500  MG tablet; Take 1 tablet (500 mg total) by mouth 2 (two) times daily with a meal.  Dispense: 180 tablet; Refill: 3  2. Essential hypertension Very good BP control. Tolerating Metoprolol without chest discomfort. Still has some evening edema of feet and ankles. Denies flutters or palpitations. Will check CMP and continue present dosage. - Comprehensive metabolic panel  3. Mixed hyperlipidemia Tolerating Pravastatin 40 mg qd. Difficult to ascertain any new myalgias due to treatment of prostate cancer with Lupron and radiation for lung cancer. Recheck lipids and CMP. - Comprehensive metabolic panel - Lipid panel  4. Prostate cancer ([place]) Stable. Continue to get Lupron injection every 3 months by oncologist. Continue follow up with Dr. Oliva Bustard.  5. Lung mass Right lung cancer followed by Pulmonologist (Dr.Mungal) and Oncologist (Dr. Donella Stade). Still on oxygen 2 LPM 24 hours a day per pulmonologist. Continue follow up with specialists. - CBC with Differential/Platelet         Vernie Murders, PA  [facility]

## 2015-12-14 LAB — CBC WITH DIFFERENTIAL/PLATELET
BASOS ABS: 0 10*3/uL (ref 0.0–0.2)
BASOS: 0 %
EOS (ABSOLUTE): 0.1 10*3/uL (ref 0.0–0.4)
Eos: 1 %
Hematocrit: 39.4 % (ref 37.5–51.0)
Hemoglobin: 13 g/dL (ref 12.6–17.7)
IMMATURE GRANS (ABS): 0 10*3/uL (ref 0.0–0.1)
IMMATURE GRANULOCYTES: 0 %
LYMPHS: 13 %
Lymphocytes Absolute: 1.5 10*3/uL (ref 0.7–3.1)
MCH: 30.2 pg (ref 26.6–33.0)
MCHC: 33 g/dL (ref 31.5–35.7)
MCV: 92 fL (ref 79–97)
Monocytes Absolute: 0.8 10*3/uL (ref 0.1–0.9)
Monocytes: 7 %
NEUTROS PCT: 79 %
Neutrophils Absolute: 8.7 10*3/uL — ABNORMAL HIGH (ref 1.4–7.0)
PLATELETS: 268 10*3/uL (ref 150–379)
RBC: 4.3 x10E6/uL (ref 4.14–5.80)
RDW: 14.5 % (ref 12.3–15.4)
WBC: 11.1 10*3/uL — AB (ref 3.4–10.8)

## 2015-12-14 LAB — COMPREHENSIVE METABOLIC PANEL
A/G RATIO: 1.7 (ref 1.2–2.2)
ALT: 15 IU/L (ref 0–44)
AST: 13 IU/L (ref 0–40)
Albumin: 3.8 g/dL (ref 3.5–4.7)
Alkaline Phosphatase: 40 IU/L (ref 39–117)
BUN/Creatinine Ratio: 16 (ref 10–24)
BUN: 16 mg/dL (ref 8–27)
Bilirubin Total: 0.3 mg/dL (ref 0.0–1.2)
CALCIUM: 8.9 mg/dL (ref 8.6–10.2)
CO2: 28 mmol/L (ref 18–29)
Chloride: 99 mmol/L (ref 96–106)
Creatinine, Ser: 0.97 mg/dL (ref 0.76–1.27)
GFR calc Af Amer: 81 mL/min/{1.73_m2} (ref >59)
GFR, EST NON AFRICAN AMERICAN: 70 mL/min/{1.73_m2} (ref >59)
Globulin, Total: 2.2 g/dL (ref 1.5–4.5)
Glucose: 114 mg/dL — ABNORMAL HIGH (ref 65–99)
POTASSIUM: 3.6 mmol/L (ref 3.5–5.2)
Sodium: 144 mmol/L (ref 134–144)
Total Protein: 6 g/dL (ref 6.0–8.5)

## 2015-12-14 LAB — LIPID PANEL
CHOLESTEROL TOTAL: 212 mg/dL — AB (ref 100–199)
Chol/HDL Ratio: 2.8 ratio units (ref 0.0–5.0)
HDL: 75 mg/dL (ref >39)
LDL Calculated: 101 mg/dL — ABNORMAL HIGH (ref 0–99)
TRIGLYCERIDES: 178 mg/dL — AB (ref 0–149)
VLDL Cholesterol Cal: 36 mg/dL (ref 5–40)

## 2015-12-14 LAB — HEMOGLOBIN A1C
ESTIMATED AVERAGE GLUCOSE: 183 mg/dL
HEMOGLOBIN A1C: 8 % — AB (ref 4.8–5.6)

## 2015-12-20 ENCOUNTER — Other Ambulatory Visit: Payer: Self-pay | Admitting: Internal Medicine

## 2016-01-04 ENCOUNTER — Encounter: Payer: Self-pay | Admitting: Cardiology

## 2016-01-04 ENCOUNTER — Ambulatory Visit (INDEPENDENT_AMBULATORY_CARE_PROVIDER_SITE_OTHER): Payer: PPO | Admitting: Cardiology

## 2016-01-04 VITALS — BP 100/60 | HR 92 | Ht 72.0 in | Wt 157.2 lb

## 2016-01-04 DIAGNOSIS — J449 Chronic obstructive pulmonary disease, unspecified: Secondary | ICD-10-CM

## 2016-01-04 DIAGNOSIS — E785 Hyperlipidemia, unspecified: Secondary | ICD-10-CM | POA: Diagnosis not present

## 2016-01-04 DIAGNOSIS — R0602 Shortness of breath: Secondary | ICD-10-CM | POA: Diagnosis not present

## 2016-01-04 DIAGNOSIS — I5032 Chronic diastolic (congestive) heart failure: Secondary | ICD-10-CM

## 2016-01-04 DIAGNOSIS — I10 Essential (primary) hypertension: Secondary | ICD-10-CM

## 2016-01-04 NOTE — Patient Instructions (Signed)
Medication Instructions:  Your physician recommends that you continue on your current medications as directed. Please refer to the Current Medication list given to you today.   Labwork: None ordered  Testing/Procedures: None ordered  Follow-Up: Your physician wants you to follow-up in: 6 months with Dr. Yvone Neu. You will receive a reminder letter in the mail two months in advance. If you don't receive a letter, please call our office to schedule the follow-up appointment.  It was a pleasure seeing you today here in the office. Please do not hesitate to give [place] a call back if you have any further questions. [place], BSN    Any Other Special Instructions Will Be Listed Below (If Applicable).     If you need a refill on your cardiac medications before your next appointment, please call your pharmacy.

## 2016-01-04 NOTE — Progress Notes (Signed)
Cardiology Office Note   Date:  01/04/2016   ID:  [name], DOB 11/12/1926, MRN [id number]  Referring Doctor:  Lelon Huh, MD   Cardiologist:   Wende Bushy, MD   Reason for consultation:  Chief Complaint  Patient presents with  . other    3 month follow up. Meds reviewed by the pt. verbally. Pt. c/o shortness of breath with little exertion and LE edema.       History of Present Illness: Alan Weaver is a 82 y.o. male who presents for Follow-up   Patient has long-standing history of severe emphysema/COPD, chronic oxygen therapy, non-small cell lung cancer, prostate cancer. He has shortness of breath going on for many years now slightly worse over the years, shortness breath with minimal exertion. Chronic shortness of breath, progressive since last visit.[name] No chest pains, nausea, diaphoresis.  Denies palpitations, CP, headache, cough, colds, abdominal pain, orthopnea, PND. Pt reports occasional ankle swelling noted at the end of the day.   ROS:  Please see the history of present illness. Aside from mentioned under HPI, all other systems are reviewed and negative.     Past Medical History  Diagnosis Date  . Heart murmur   . COPD (chronic obstructive pulmonary disease) ([place])   . High blood cholesterol level   . Irritable bowel syndrome   . Diabetes mellitus without complication ([place])   . Osteoporosis   . Incontinence   . Paroxysmal a-fib ([place])   . Cancer [facility]) 1994    prostate, then lung  . Pneumonia   . Hypertension   . Chronic kidney disease     KIDNEY STONES  . GERD (gastroesophageal reflux disease)   . On home oxygen therapy     2L [place] PRN  Patient unaware of atrial fibrillation history. He has never been on anticoagulation. Awaiting records from previous cardiologist.  Past Surgical History  Procedure Laterality Date  . Colonoscopy  03/12/13  . Prostate surgery  1994  . Hernia repair  2011  . Surgery for staph infection  1995  . Colonoscopy   2011  . Bone density test      Spine T-spine = -1.7  . Electromagnetic navigation brochoscopy N/A 01/07/2015    Procedure: ELECTROMAGNETIC NAVIGATION BRONCHOSCOPY;  Surgeon: Vilinda Boehringer, MD;  Location: [facility] ORS;  Service: Cardiopulmonary;  Laterality: N/A;     reports that he quit smoking about 9 years ago. His smoking use included Cigarettes. He smoked 1.00 pack per day. He has never used smokeless tobacco. He reports that he does not drink alcohol or use illicit drugs.   family history includes Asthma in his mother; Cancer in his brother and father; Osteoporosis in his mother; Other in his sister.   Current Outpatient Prescriptions  Medication Sig Dispense Refill  . albuterol (PROVENTIL HFA;VENTOLIN HFA) 108 (90 BASE) MCG/ACT inhaler Inhale 2 puffs into the lungs every 6 (six) hours as needed for wheezing or shortness of breath.     [name] alendronate (FOSAMAX) 70 MG tablet TAKE ONE TABLET BY MOUTH ONCE A WEEK 12 tablet 4  . aspirin EC 81 MG tablet Take 162 mg by mouth every 4 (four) hours as needed for mild pain.     [name] azithromycin (ZITHROMAX) 250 MG tablet TAKE ONE TABLET BY MOUTH ONCE DAILY 30 tablet 3  . Calcium Carbonate-Vitamin D (CALCIUM 600+D) 600-400 MG-UNIT tablet Take 1 tablet by mouth 2 (two) times daily.    . cholecalciferol (VITAMIN D) 1000 UNITS tablet Take  2,000 Units by mouth daily.     . Cranberry-Vitamin C-Probiotic (AZO CRANBERRY) 250-30 MG TABS Take 2 tablets by mouth every evening.    . Fluticasone-Salmeterol (ADVAIR DISKUS) 500-50 MCG/DOSE AEPB Inhale 1 puff into the lungs 2 (two) times daily. 60 each 5  . ipratropium-albuterol (DUONEB) 0.5-2.5 (3) MG/3ML SOLN USE ONE VIAL IN NEBULIZER 4 TIMES DAILY 1080 mL 4  . Leuprolide Acetate (LUPRON IJ) Inject 1 Dose as directed every 3 (three) months.     . metFORMIN (GLUCOPHAGE) 500 MG tablet Take 1 tablet (500 mg total) by mouth 2 (two) times daily with a meal. 180 tablet 3  . metoprolol tartrate (LOPRESSOR) 25 MG tablet Take 1  tablet (25 mg total) by mouth 3 (three) times daily. 90 tablet 3  . Omega-3 Fatty Acids (FISH OIL) 1000 MG CAPS Take 2,000 mg by mouth daily.     . pravastatin (PRAVACHOL) 40 MG tablet Take 40 mg by mouth daily.     . predniSONE (DELTASONE) 20 MG tablet TAKE ONE TABLET BY MOUTH ONCE DAILY WITH BREAKFAST 30 tablet 3  . tiotropium (SPIRIVA) 18 MCG inhalation capsule Place 18 mcg into inhaler and inhale daily.     No current facility-administered medications for this visit.   Patient previously on metoprolol. This was discontinued while he was in the hospital for low blood pressure.  Allergies: Review of patient's allergies indicates no known allergies.    PHYSICAL EXAM: VS:  BP 100/60 mmHg  Pulse 92  Ht 6' (1.829 m)  Wt 157 lb 4 oz (71.328 kg)  BMI 21.32 kg/m2  SpO2 91% , Body mass index is 21.32 kg/(m^2). Wt Readings from Last 3 Encounters:  01/04/16 157 lb 4 oz (71.328 kg)  12/13/15 162 lb (73.483 kg)  11/05/15 160 lb (72.576 kg)    GENERAL:  well developed, well nourished, in mild respiratory distress HEENT: normocephalic, pink conjunctivae, anicteric sclerae, no xanthelasma, normal dentition, oropharynx clear NECK:  no neck vein engorgement, JVP normal, no hepatojugular reflux, carotid upstroke brisk and symmetric, no bruit, no thyromegaly, no lymphadenopathy LUNGS:  Fair respiratory effort, pursed lip breathing, diminished breath sounds bilaterally, rhonchi diffusely CV:  PMI not displaced, no thrills, no lifts, S1 and S2 within normal limits, no palpable S3 or S4, no murmurs, no rubs, no gallops ABD:  Soft, nontender, nondistended, normoactive bowel sounds, no abdominal aortic bruit, no hepatomegaly, no splenomegaly MS: nontender back, positive kyphosis, no scoliosis, no joint deformities EXT:  2+ DP/PT pulses, No edema, no varicosities, no cyanosis, no clubbing SKIN: warm, nondiaphoretic, normal turgor, no ulcers NEUROPSYCH: alert, oriented to person, place, and time,  sensory/motor grossly intact, normal mood, appropriate affect    Other studies Reviewed:  EKG:   The ekg 09/02/2015 ordered today was personally reviewed by me and it reveals sinus tachycardia, 101 BPM, frequent PVCs, right atrial enlargement.  EKG from 10/04/2015 was personally reviewed by me and it showed sinus rhythm, 99 BPM,  sinus arrhythmia, occasional PVCs. Poor R-wave progression  EKG from 01/04/2016 was personally reviewed by me. It showed sinus rhythm, sinus arrhythmia, ventricular rate of 92 BPM.  Additional studies/ records that were reviewed personally reviewed by me today include:  Echo 07/14/2015: LVEF 50-55%. Abnormal left ventricular relaxation. Grade 1 diastolic dysfunction. PA pressure 36 mmHg.  ASSESSMENT AND PLAN:  -Shortness of breath  Patient has significant lung pathology including severe emphysema/COPD, chronic oxygen therapy, 2 L of oxygen every day, non-small cell lung cancer on the right lung. Echocardiogram from  07/14/2015 shows preserved ejection fraction and abnormal left ventricular relaxation. There may be some element of mild diastolic dysfunction that may be contributing minimally to patient's shortness of breath. Ideally, further investigation with stress testing can be done. However, based on medical records that are reviewed, prognosis due to patient's pulmonary condition is poor. This was discussed in detail with the patient and his daughter. Patient does not wish to undergo any invasive testing. This has been confirmed on today's visit as well. Therefore, defer stress testing for now. Rec medical therapy.  Continue metoprolol. Cont asa, statin.  Pt to ffup with pulmonary.   Possible CHF, diastolic dysfunction, chronic Medical therapy as above. No evidence of volume overload today.  HTN BP is well controlled. Continue monitoring BP. Continue current medical therapy and lifestyle changes.  Hyperlipidemia Cont statin therapy  pvcs Cont  metoprolol. None noted on EKG today. Cont to monitor.  Sinus tachycardia Heart rate less than 100 BPM. Likely due to severe emphysema.   Current medicines are reviewed at length with the patient today.  The patient does not have concerns regarding medicines.  Labs/ tests ordered today include:  Orders Placed This Encounter  Procedures  . EKG 12-Lead    I had a lengthy and detailed discussion with the patient regarding diagnoses, prognosis, diagnostic options, treatment options, and side effects of medications.   We touched on discussion about CODE STATUS as well as consideration for palliative/hospice care. Patient has been thinking about this. At this moment, he does not feel he wants to be resuscitated if something were to happen to him. He is not 093% certain though. Recommend further discussion with his PCP or with pulmonary.    I spent at least 25 minutes with the patient today and more than 50% of the time was spent counseling the patient and coordinating care.       Disposition:   FU with undersigned in 6 months   Signed, Wende Bushy, MD  01/04/2016 11:41 AM    [place]

## 2016-01-12 ENCOUNTER — Ambulatory Visit
Admission: RE | Admit: 2016-01-12 | Discharge: 2016-01-12 | Disposition: A | Discharge status: Home / Self Care | Payer: PPO | Source: Ambulatory Visit | Attending: Radiation Oncology | Admitting: Radiation Oncology

## 2016-01-12 DIAGNOSIS — R918 Other nonspecific abnormal finding of lung field: Secondary | ICD-10-CM | POA: Insufficient documentation

## 2016-01-12 DIAGNOSIS — C3411 Malignant neoplasm of upper lobe, right bronchus or lung: Secondary | ICD-10-CM | POA: Diagnosis present

## 2016-01-12 MED ORDER — IOPAMIDOL (ISOVUE-300) INJECTION 61%
75.0000 mL | Freq: Once | INTRAVENOUS | Status: AC | PRN
  Administered 2016-01-12: 75 mL via INTRAVENOUS

## 2016-01-14 ENCOUNTER — Other Ambulatory Visit: Payer: Self-pay | Admitting: Family Medicine

## 2016-01-14 DIAGNOSIS — E782 Mixed hyperlipidemia: Secondary | ICD-10-CM

## 2016-01-17 ENCOUNTER — Inpatient Hospital Stay: Payer: PPO | Attending: Oncology | Admitting: Oncology

## 2016-01-17 ENCOUNTER — Encounter: Payer: Self-pay | Admitting: Radiation Oncology

## 2016-01-17 ENCOUNTER — Inpatient Hospital Stay: Payer: PPO

## 2016-01-17 ENCOUNTER — Ambulatory Visit: Payer: PPO | Admitting: Oncology

## 2016-01-17 ENCOUNTER — Other Ambulatory Visit: Payer: Self-pay | Admitting: *Deleted

## 2016-01-17 ENCOUNTER — Ambulatory Visit
Admission: RE | Admit: 2016-01-17 | Discharge: 2016-01-17 | Disposition: A | Discharge status: Home / Self Care | Payer: PPO | Source: Ambulatory Visit | Attending: Radiation Oncology | Admitting: Radiation Oncology

## 2016-01-17 ENCOUNTER — Ambulatory Visit: Payer: PPO | Admitting: Family Medicine

## 2016-01-17 VITALS — BP 122/67 | HR 96 | Temp 96.6°F | Resp 22 | Wt 154.5 lb

## 2016-01-17 VITALS — BP 114/62 | HR 51 | Temp 97.0°F | Resp 18 | Wt 154.4 lb

## 2016-01-17 DIAGNOSIS — I480 Paroxysmal atrial fibrillation: Secondary | ICD-10-CM | POA: Diagnosis not present

## 2016-01-17 DIAGNOSIS — C3411 Malignant neoplasm of upper lobe, right bronchus or lung: Secondary | ICD-10-CM | POA: Insufficient documentation

## 2016-01-17 DIAGNOSIS — N189 Chronic kidney disease, unspecified: Secondary | ICD-10-CM | POA: Diagnosis not present

## 2016-01-17 DIAGNOSIS — R531 Weakness: Secondary | ICD-10-CM | POA: Insufficient documentation

## 2016-01-17 DIAGNOSIS — I499 Cardiac arrhythmia, unspecified: Secondary | ICD-10-CM | POA: Diagnosis not present

## 2016-01-17 DIAGNOSIS — Z923 Personal history of irradiation: Secondary | ICD-10-CM | POA: Insufficient documentation

## 2016-01-17 DIAGNOSIS — R32 Unspecified urinary incontinence: Secondary | ICD-10-CM | POA: Diagnosis not present

## 2016-01-17 DIAGNOSIS — Z8701 Personal history of pneumonia (recurrent): Secondary | ICD-10-CM | POA: Diagnosis not present

## 2016-01-17 DIAGNOSIS — Z800 Family history of malignant neoplasm of digestive organs: Secondary | ICD-10-CM

## 2016-01-17 DIAGNOSIS — Z87891 Personal history of nicotine dependence: Secondary | ICD-10-CM | POA: Diagnosis not present

## 2016-01-17 DIAGNOSIS — Z7984 Long term (current) use of oral hypoglycemic drugs: Secondary | ICD-10-CM | POA: Insufficient documentation

## 2016-01-17 DIAGNOSIS — I129 Hypertensive chronic kidney disease with stage 1 through stage 4 chronic kidney disease, or unspecified chronic kidney disease: Secondary | ICD-10-CM | POA: Diagnosis not present

## 2016-01-17 DIAGNOSIS — K589 Irritable bowel syndrome without diarrhea: Secondary | ICD-10-CM | POA: Diagnosis not present

## 2016-01-17 DIAGNOSIS — E7800 Pure hypercholesterolemia, unspecified: Secondary | ICD-10-CM | POA: Insufficient documentation

## 2016-01-17 DIAGNOSIS — J449 Chronic obstructive pulmonary disease, unspecified: Secondary | ICD-10-CM | POA: Insufficient documentation

## 2016-01-17 DIAGNOSIS — R0602 Shortness of breath: Secondary | ICD-10-CM | POA: Diagnosis not present

## 2016-01-17 DIAGNOSIS — Z8052 Family history of malignant neoplasm of bladder: Secondary | ICD-10-CM | POA: Diagnosis not present

## 2016-01-17 DIAGNOSIS — E785 Hyperlipidemia, unspecified: Secondary | ICD-10-CM | POA: Diagnosis not present

## 2016-01-17 DIAGNOSIS — K219 Gastro-esophageal reflux disease without esophagitis: Secondary | ICD-10-CM | POA: Insufficient documentation

## 2016-01-17 DIAGNOSIS — C61 Malignant neoplasm of prostate: Secondary | ICD-10-CM | POA: Diagnosis not present

## 2016-01-17 DIAGNOSIS — Z79818 Long term (current) use of other agents affecting estrogen receptors and estrogen levels: Secondary | ICD-10-CM | POA: Insufficient documentation

## 2016-01-17 DIAGNOSIS — R011 Cardiac murmur, unspecified: Secondary | ICD-10-CM | POA: Diagnosis not present

## 2016-01-17 DIAGNOSIS — M818 Other osteoporosis without current pathological fracture: Secondary | ICD-10-CM | POA: Insufficient documentation

## 2016-01-17 DIAGNOSIS — E119 Type 2 diabetes mellitus without complications: Secondary | ICD-10-CM | POA: Diagnosis not present

## 2016-01-17 DIAGNOSIS — Z7952 Long term (current) use of systemic steroids: Secondary | ICD-10-CM

## 2016-01-17 DIAGNOSIS — Z9079 Acquired absence of other genital organ(s): Secondary | ICD-10-CM | POA: Diagnosis not present

## 2016-01-17 DIAGNOSIS — R5383 Other fatigue: Secondary | ICD-10-CM | POA: Diagnosis not present

## 2016-01-17 LAB — COMPREHENSIVE METABOLIC PANEL
ALBUMIN: 3.8 g/dL (ref 3.5–5.0)
ALK PHOS: 44 U/L (ref 38–126)
ALT: 14 U/L — ABNORMAL LOW (ref 17–63)
ANION GAP: 10 (ref 5–15)
AST: 16 U/L (ref 15–41)
BILIRUBIN TOTAL: 0.8 mg/dL (ref 0.3–1.2)
BUN: 19 mg/dL (ref 6–20)
CALCIUM: 9.4 mg/dL (ref 8.9–10.3)
CO2: 32 mmol/L (ref 22–32)
Chloride: 98 mmol/L — ABNORMAL LOW (ref 101–111)
Creatinine, Ser: 1.02 mg/dL (ref 0.61–1.24)
GFR calc non Af Amer: >60 mL/min (ref >60)
Glucose, Bld: 129 mg/dL — ABNORMAL HIGH (ref 65–99)
POTASSIUM: 4 mmol/L (ref 3.5–5.1)
SODIUM: 140 mmol/L (ref 135–145)
TOTAL PROTEIN: 7 g/dL (ref 6.5–8.1)

## 2016-01-17 LAB — CBC WITH DIFFERENTIAL/PLATELET
BASOS PCT: 0 %
Basophils Absolute: 0 10*3/uL (ref 0–0.1)
EOS ABS: 0.1 10*3/uL (ref 0–0.7)
Eosinophils Relative: 1 %
HEMATOCRIT: 41.8 % (ref 40.0–52.0)
HEMOGLOBIN: 13.9 g/dL (ref 13.0–18.0)
LYMPHS ABS: 1 10*3/uL (ref 1.0–3.6)
Lymphocytes Relative: 10 %
MCH: 30.5 pg (ref 26.0–34.0)
MCHC: 33.1 g/dL (ref 32.0–36.0)
MCV: 91.9 fL (ref 80.0–100.0)
MONO ABS: 0.7 10*3/uL (ref 0.2–1.0)
MONOS PCT: 7 %
NEUTROS PCT: 82 %
Neutro Abs: 8.5 10*3/uL — ABNORMAL HIGH (ref 1.4–6.5)
Platelets: 312 10*3/uL (ref 150–440)
RBC: 4.55 MIL/uL (ref 4.40–5.90)
RDW: 14.8 % — AB (ref 11.5–14.5)
WBC: 10.4 10*3/uL (ref 3.8–10.6)

## 2016-01-17 LAB — PSA: PSA: 0.51 ng/mL (ref 0.00–4.00)

## 2016-01-17 MED ORDER — LEUPROLIDE ACETATE (4 MONTH) 30 MG IM KIT
30.0000 mg | PACK | Freq: Once | INTRAMUSCULAR | Status: AC
  Administered 2016-01-17: 30 mg via INTRAMUSCULAR

## 2016-01-17 NOTE — Progress Notes (Signed)
Radiation Oncology Follow up Note  Name: Alan Weaver   Date:   01/17/2016 MRN:  [id number] DOB: [birth year]    This 82 y.o. male presents to the clinic today for 10 month follow-up for SB RT to right upper lobe.  REFERRING PROVIDER: Forest Gleason, MD  HPI: Patient is a 82 year old male now out 11 months having completed SB RT to his right upper lobe for non-small cell lung cancer. He is also being followed for prostate cancer with dosing with Lupron.. He has extremely poor lung function is nasal oxygen dependent. Has not noticed any decrease in his pulmonary status. Specifically denies cough hemoptysis or chest tightness. Recently had a CT scan which shows improvement in his right upper lobe nodule some other patches in the lung consistent with probably inflammatory changes.  COMPLICATIONS OF TREATMENT: none  FOLLOW UP COMPLIANCE: keeps appointments   PHYSICAL EXAM:  BP 122/67 (BP Location: Left Arm)   Pulse 96   Temp (!) 96.6 F (35.9 C)   Resp (!) 22   Wt 154 lb 8.7 oz (70.1 kg)   BMI 20.96 kg/m  Well-developed elderly male wheelchair-bound in NAD on nasal oxygen. Well-developed well-nourished patient in NAD. HEENT reveals PERLA, EOMI, discs not visualized.  Oral cavity is clear. No oral mucosal lesions are identified. Neck is clear without evidence of cervical or supraclavicular adenopathy. Lungs are clear to A&P. Cardiac examination is essentially unremarkable with regular rate and rhythm without murmur rub or thrill. Abdomen is benign with no organomegaly or masses noted. Motor sensory and DTR levels are equal and symmetric in the upper and lower extremities. Cranial nerves II through XII are grossly intact. Proprioception is intact. No peripheral adenopathy or edema is identified. No motor or sensory levels are noted. Crude visual fields are within normal range.  RADIOLOGY RESULTS: CT scan is reviewed compatible with prior studies  PLAN: Present time he is doing well with  stable lung disease. I've asked to see him back in 6 months and will obtain a CT scan with contrast prior to that visit. Patient continues close follow-up care with medical oncology for his progressive prostate cancer. Patient is to call with any concerns.  I would like to take this opportunity to thank you for allowing me to participate in the care of your patient.Armstead Peaks., MD

## 2016-01-17 NOTE — Progress Notes (Signed)
Complaints of difficulty breathing due to COPD.

## 2016-01-17 NOTE — Addendum Note (Signed)
Encounter addended by: Christean Grief, RN on: 01/17/2016  9:21 AM<BR>    Actions taken: Flowsheet accepted

## 2016-01-19 NOTE — Progress Notes (Signed)
[place]  [phone]  [phone]) [phone]     Alan Weaver DOB: Mar 10, 1927  MR#: [id number]  SWN#:[id number]  Patient Care Team: Birdie Sons, MD as PCP - General (Family Medicine) Robert Bellow, MD (General Surgery) Vilinda Boehringer, MD as Consulting Physician (Internal Medicine)  CHIEF COMPLAINT: Carcinoma of prostate, status post original radical prostatectomy.  INTERVAL HISTORY:  Patient returns to clinic today for further evaluation, laboratory work, and continuation of Lupron. He continues to have shortness of breath secondary to underlying COPD, but otherwise feels well. He has no neurologic complaint. He denies any recent fevers or illnesses. He has a fair appetite, but denies weight loss. He denies any chest pain or hemoptysis. He has no nausea, vomiting, constipation, or diarrhea. He has no urinary complaints. Patient offers no further specific complaints today.   REVIEW OF SYSTEMS:   Review of Systems  Constitutional: Positive for malaise/fatigue. Negative for fever and weight loss.  Respiratory: Positive for shortness of breath. Negative for cough and hemoptysis.   Cardiovascular: Negative.  Negative for chest pain.  Gastrointestinal: Negative.  Negative for abdominal pain.  Genitourinary: Negative.   Musculoskeletal: Negative.   Neurological: Negative.   Psychiatric/Behavioral: Negative.     As per HPI. Otherwise, a complete review of systems is negatve.  ONCOLOGY HISTORY: Oncology History   1.  Prostate cancer.  Original radical prostatectomy.  Later recurrence and radiation treatment.  Later a rising PSA.  Referred for Lupron beginning May 2002. Treatment 5/02 to 5/03. Drug holiday 5/03 to 3/04. Treatment 3/04 to 2/05. Drug holiday 2/05. Restarted 7/05, off again , last dose 01/2010, then on and off again, se earlier notes,  resumed 06/2012.  2.  Previous surgeries include umbilical hernia repair and lysing of adhesions, inguinal  hernia repair then breakdown and recurrence.  3.  Hyperlipidemia.  4.  History of cardiac arrhythmia ventricular beats evaluated in the past by Dr. Clayborn Bigness.    5.  Osteoporosis. 6 right upper lobe lung nodule is positive for non-small cell carcinoma of lung stage I disease     Prostate cancer ([place])    PAST MEDICAL HISTORY: Past Medical History:  Diagnosis Date  . Cancer [facility]) 1994   prostate, then lung  . Chronic kidney disease    KIDNEY STONES  . COPD (chronic obstructive pulmonary disease) ([place])   . Diabetes mellitus without complication ([place])   . GERD (gastroesophageal reflux disease)   . Heart murmur   . High blood cholesterol level   . Hypertension   . Incontinence   . Irritable bowel syndrome   . On home oxygen therapy    2L [place] PRN  . Osteoporosis   . Paroxysmal a-fib ([place])   . Pneumonia     PAST SURGICAL HISTORY: Past Surgical History:  Procedure Laterality Date  . Bone density test     Spine T-spine = -1.7  . COLONOSCOPY  03/12/13  . COLONOSCOPY  2011  . ELECTROMAGNETIC NAVIGATION BROCHOSCOPY N/A 01/07/2015   Procedure: ELECTROMAGNETIC NAVIGATION BRONCHOSCOPY;  Surgeon: Vilinda Boehringer, MD;  Location: [facility] ORS;  Service: Cardiopulmonary;  Laterality: N/A;  . HERNIA REPAIR  2011  . PROSTATE SURGERY  1994  . surgery for staph infection  1995    FAMILY HISTORY Family History  Problem Relation Age of Onset  . Cancer Father     bladder  . Asthma Mother   . Osteoporosis Mother   . Other Sister     brain tumor  . Cancer  Brother     colon    GYNECOLOGIC HISTORY:  No LMP for male patient.     ADVANCED DIRECTIVES:    HEALTH MAINTENANCE: Social History  Substance Use Topics  . Smoking status: Former Smoker    Packs/day: 1.00    Types: Cigarettes    Quit date: 05/15/2006  . Smokeless tobacco: Never Used  . Alcohol use No   No Known Allergies  Current Outpatient Prescriptions  Medication Sig Dispense Refill  . albuterol (PROVENTIL HFA;VENTOLIN  HFA) 108 (90 BASE) MCG/ACT inhaler Inhale 2 puffs into the lungs every 6 (six) hours as needed for wheezing or shortness of breath.     [name] alendronate (FOSAMAX) 70 MG tablet TAKE ONE TABLET BY MOUTH ONCE A WEEK 12 tablet 4  . aspirin EC 81 MG tablet Take 162 mg by mouth every 4 (four) hours as needed for mild pain.     [name] azithromycin (ZITHROMAX) 250 MG tablet TAKE ONE TABLET BY MOUTH ONCE DAILY 30 tablet 3  . Calcium Carbonate-Vitamin D (CALCIUM 600+D) 600-400 MG-UNIT tablet Take 1 tablet by mouth 2 (two) times daily.    . cholecalciferol (VITAMIN D) 1000 UNITS tablet Take 2,000 Units by mouth daily.     . Cranberry-Vitamin C-Probiotic (AZO CRANBERRY) 250-30 MG TABS Take 2 tablets by mouth every evening.    . Fluticasone-Salmeterol (ADVAIR DISKUS) 500-50 MCG/DOSE AEPB Inhale 1 puff into the lungs 2 (two) times daily. 60 each 5  . ipratropium-albuterol (DUONEB) 0.5-2.5 (3) MG/3ML SOLN USE ONE VIAL IN NEBULIZER 4 TIMES DAILY 1080 mL 4  . Leuprolide Acetate (LUPRON IJ) Inject 1 Dose as directed every 3 (three) months.     . metFORMIN (GLUCOPHAGE) 500 MG tablet Take 1 tablet (500 mg total) by mouth 2 (two) times daily with a meal. 180 tablet 3  . metoprolol tartrate (LOPRESSOR) 25 MG tablet Take 1 tablet (25 mg total) by mouth 3 (three) times daily. 90 tablet 3  . Omega-3 Fatty Acids (FISH OIL) 1000 MG CAPS Take 2,000 mg by mouth daily.     . pravastatin (PRAVACHOL) 40 MG tablet TAKE ONE TABLET BY MOUTH ONCE DAILY 90 tablet 4  . predniSONE (DELTASONE) 20 MG tablet TAKE ONE TABLET BY MOUTH ONCE DAILY WITH BREAKFAST 30 tablet 3  . tiotropium (SPIRIVA) 18 MCG inhalation capsule Place 18 mcg into inhaler and inhale daily.     No current facility-administered medications for this visit.     OBJECTIVE: BP 114/62 (BP Location: Right Arm, Patient Position: Sitting)   Pulse (!) 51   Temp 97 F (36.1 C) (Tympanic)   Resp 18   Wt 154 lb 6.9 oz (70.1 kg)   BMI 20.94 kg/m    Body mass index is 20.94 kg/m.     ECOG FS:2 - Symptomatic, <50% confined to bed  General: Well-developed, well-nourished, no acute distress. Sitting comfortably in wheelchair. Eyes: Pink conjunctiva, anicteric sclera. HEENT: Normocephalic, moist mucous membranes, clear oropharnyx. Lungs: Lung sounds diminished bilaterally, using 2 L of oxygen via nasal cannula. Heart: Regular rate and rhythm. No rubs, murmurs, or gallops. Abdomen: Soft, nontender, nondistended. No organomegaly noted, normoactive bowel sounds. Musculoskeletal: No edema, cyanosis, or clubbing. Neuro: Alert, answering all questions appropriately. Cranial nerves grossly intact. Skin: No rashes or petechiae noted. Psych: Normal affect.   LAB RESULTS:  BMET    Component Value Date/Time   NA 140 01/17/2016 0841   NA 144 12/13/2015 1048   NA 140 06/07/2013 2123   K 4.0 01/17/2016 0841  K 4.2 06/07/2013 2123   CL 98 (L) 01/17/2016 0841   CL 103 06/07/2013 2123   CO2 32 01/17/2016 0841   CO2 30 06/07/2013 2123   GLUCOSE 129 (H) 01/17/2016 0841   GLUCOSE 221 (H) 06/07/2013 2123   BUN 19 01/17/2016 0841   BUN 16 12/13/2015 1048   BUN 27 (H) 06/07/2013 2123   CREATININE 1.02 01/17/2016 0841   CREATININE 1.21 06/07/2013 2123   CALCIUM 9.4 01/17/2016 0841   CALCIUM 9.2 06/07/2013 2123   GFRNONAA >60 01/17/2016 0841   GFRNONAA 55 (L) 06/07/2013 2123   GFRAA >60 01/17/2016 0841   GFRAA >60 06/07/2013 2123   Lab Results  Component Value Date   PSA 0.51 01/17/2016     STUDIES: No results found.  ASSESSMENT:  Carcinoma of prostate, status post original radical prostatectomy.  PLAN:    1. Carcinoma of prostate, status post original radical prostatectomy: Continue Lupron 30 mg IM today. Patient's PSA has trended up slightly and is now 0.51. Given patient's advanced age and decreased performance status, it is unclear if you would be able to tolerate any additional aggressive treatment. Patient has requested less frequent follow-up, therefore when  he returns to clinic in 4 months we will give 45 mg IM Lupron every 6 months. 2. Right upper lobe lung cancer: Patient was not a surgical candidate. Status post XRT. Given his poor performance status, patient is not a candidate for aggressive treatment. Continue to monitor with routine CT scans.   Patient expressed understanding and was in agreement with this plan. He also understands that He can call clinic at any time with any questions, concerns, or complaints.    Lloyd Huger, MD   01/19/2016 8:37 AM

## 2016-01-20 ENCOUNTER — Encounter: Payer: Self-pay | Admitting: Urgent Care

## 2016-01-20 ENCOUNTER — Emergency Department: Payer: PPO

## 2016-01-20 DIAGNOSIS — Z79899 Other long term (current) drug therapy: Secondary | ICD-10-CM | POA: Diagnosis not present

## 2016-01-20 DIAGNOSIS — Z87891 Personal history of nicotine dependence: Secondary | ICD-10-CM | POA: Diagnosis not present

## 2016-01-20 DIAGNOSIS — J189 Pneumonia, unspecified organism: Secondary | ICD-10-CM | POA: Insufficient documentation

## 2016-01-20 DIAGNOSIS — Z8719 Personal history of other diseases of the digestive system: Secondary | ICD-10-CM | POA: Insufficient documentation

## 2016-01-20 DIAGNOSIS — S32010A Wedge compression fracture of first lumbar vertebra, initial encounter for closed fracture: Secondary | ICD-10-CM | POA: Diagnosis not present

## 2016-01-20 DIAGNOSIS — Z8546 Personal history of malignant neoplasm of prostate: Secondary | ICD-10-CM | POA: Insufficient documentation

## 2016-01-20 DIAGNOSIS — I129 Hypertensive chronic kidney disease with stage 1 through stage 4 chronic kidney disease, or unspecified chronic kidney disease: Secondary | ICD-10-CM | POA: Diagnosis not present

## 2016-01-20 DIAGNOSIS — W1809XA Striking against other object with subsequent fall, initial encounter: Secondary | ICD-10-CM | POA: Diagnosis not present

## 2016-01-20 DIAGNOSIS — Z7982 Long term (current) use of aspirin: Secondary | ICD-10-CM | POA: Insufficient documentation

## 2016-01-20 DIAGNOSIS — R0789 Other chest pain: Secondary | ICD-10-CM | POA: Diagnosis present

## 2016-01-20 DIAGNOSIS — E1122 Type 2 diabetes mellitus with diabetic chronic kidney disease: Secondary | ICD-10-CM | POA: Diagnosis not present

## 2016-01-20 DIAGNOSIS — Y939 Activity, unspecified: Secondary | ICD-10-CM | POA: Insufficient documentation

## 2016-01-20 DIAGNOSIS — Z85118 Personal history of other malignant neoplasm of bronchus and lung: Secondary | ICD-10-CM | POA: Diagnosis not present

## 2016-01-20 DIAGNOSIS — E785 Hyperlipidemia, unspecified: Secondary | ICD-10-CM | POA: Diagnosis not present

## 2016-01-20 DIAGNOSIS — N189 Chronic kidney disease, unspecified: Secondary | ICD-10-CM | POA: Diagnosis not present

## 2016-01-20 DIAGNOSIS — J441 Chronic obstructive pulmonary disease with (acute) exacerbation: Secondary | ICD-10-CM | POA: Diagnosis not present

## 2016-01-20 DIAGNOSIS — Y999 Unspecified external cause status: Secondary | ICD-10-CM | POA: Insufficient documentation

## 2016-01-20 DIAGNOSIS — Y929 Unspecified place or not applicable: Secondary | ICD-10-CM | POA: Insufficient documentation

## 2016-01-20 NOTE — ED Triage Notes (Addendum)
Patient presents with c/o LEFT chest wall pain s/p fall. Patient reports that his leg "gave out" causing him to fall and strike his walker. Unable to determine whether or note patient is SOB secondary to chronic lung disease that requires continuous supplemental oxygen at 2L/[place]. (+) increased pain with deep inspiration; reports that he initially "couldnt catch his breath" immediately following the fall. Patient with bruising and abrasions noted to LUE that resulted from the fall as well.

## 2016-01-21 ENCOUNTER — Emergency Department
Admission: EM | Admit: 2016-01-21 | Discharge: 2016-01-21 | Disposition: A | Discharge status: Home / Self Care | Payer: PPO | Attending: Emergency Medicine | Admitting: Emergency Medicine

## 2016-01-21 ENCOUNTER — Emergency Department: Payer: PPO

## 2016-01-21 DIAGNOSIS — W19XXXA Unspecified fall, initial encounter: Secondary | ICD-10-CM

## 2016-01-21 DIAGNOSIS — Y92009 Unspecified place in unspecified non-institutional (private) residence as the place of occurrence of the external cause: Secondary | ICD-10-CM

## 2016-01-21 DIAGNOSIS — J189 Pneumonia, unspecified organism: Secondary | ICD-10-CM

## 2016-01-21 DIAGNOSIS — IMO0002 Reserved for concepts with insufficient information to code with codable children: Secondary | ICD-10-CM

## 2016-01-21 DIAGNOSIS — R0789 Other chest pain: Secondary | ICD-10-CM

## 2016-01-21 MED ORDER — IPRATROPIUM-ALBUTEROL 0.5-2.5 (3) MG/3ML IN SOLN
3.0000 mL | Freq: Once | RESPIRATORY_TRACT | Status: AC
  Administered 2016-01-21: 3 mL via RESPIRATORY_TRACT
  Filled 2016-01-21: qty 3

## 2016-01-21 MED ORDER — AMOXICILLIN-POT CLAVULANATE 875-125 MG PO TABS
1.0000 | ORAL_TABLET | Freq: Once | ORAL | Status: AC
  Administered 2016-01-21: 1 via ORAL
  Filled 2016-01-21: qty 1

## 2016-01-21 MED ORDER — AMOXICILLIN-POT CLAVULANATE 875-125 MG PO TABS: 1.0000 | ORAL_TABLET | Freq: Two times a day (BID) | ORAL | 0 refills | Status: DC

## 2016-01-21 MED ORDER — HYDROCODONE-ACETAMINOPHEN 5-325 MG PO TABS: 1.0000 | ORAL_TABLET | Freq: Four times a day (QID) | ORAL | 0 refills | Status: AC | PRN

## 2016-01-21 MED ORDER — HYDROCODONE-ACETAMINOPHEN 5-325 MG PO TABS
1.0000 | ORAL_TABLET | Freq: Once | ORAL | Status: AC
  Administered 2016-01-21: 1 via ORAL
  Filled 2016-01-21: qty 1

## 2016-01-21 NOTE — ED Notes (Signed)
Patient transported to CT 

## 2016-01-21 NOTE — Discharge Instructions (Signed)
1. Take antibiotic as prescribed (Augmentin 875 mg twice daily 7 days). 2. You may take Norco as needed for pain. 3. There was a nodule seen on your chest CT which your doctor will need to follow-up on. 4. Return to the ER for worsening symptoms, fever, difficulty breathing or other concerns.

## 2016-01-21 NOTE — ED Provider Notes (Signed)
[facility] Emergency Department Provider Note   ____________________________________________   First MD Initiated Contact with Patient 01/21/16 0104     (approximate)  I have reviewed the triage vital signs and the nursing notes.   HISTORY  Chief Complaint Fall and Chest Pain (RIBS)    HPI Alan Weaver is a 82 y.o. male who presents to the ED from home with a chief complaint of chest wall pain. Patient has a history of COPD on continuous supplemental oxygen at 2L/[place]. Reports approximately 5 PM that his left knee "gave out" on him and that he fell, striking his walker with his left anterior chest. Patient also has some bruising and abrasions to his left arm from the fall. Denies taking anticoagulants. Denies increased shortness of breath since injury. Complains of anterior chest wall pain. Denies recent fever, chills, abdominal pain, nausea, vomiting, diarrhea. Denies recent travel. Nothing makes his pain better. Movement and deep inspiration make his pain worse.   Past Medical History:  Diagnosis Date  . Cancer [facility]) 1994   prostate, then lung  . Chronic kidney disease    KIDNEY STONES  . COPD (chronic obstructive pulmonary disease) ([place])   . Diabetes mellitus without complication ([place])   . GERD (gastroesophageal reflux disease)   . Heart murmur   . High blood cholesterol level   . Hypertension   . Incontinence   . Irritable bowel syndrome   . On home oxygen therapy    2L [place] PRN  . Osteoporosis   . Paroxysmal a-fib ([place])   . Pneumonia     Patient Active Problem List   Diagnosis Date Noted  . Acute on chronic respiratory failure with hypoxia ([place]) 06/20/2015  . Paroxysmal a-fib ([place]) 06/20/2015  . HTN (hypertension) 06/20/2015  . Back pain 06/20/2015  . Non-small cell cancer of right lung ([place]) 02/09/2015  . Dyspnea 12/24/2014  . COPD (chronic obstructive pulmonary disease) ([place]) 12/24/2014  . Hypoxia 12/24/2014  . Allergic rhinitis  12/08/2014  . Diverticulosis 12/08/2014  . DM type 2 with diabetic peripheral neuropathy ([place]) 12/08/2014  . Mixed hyperlipidemia 12/08/2014  . Osteoporosis 12/08/2014  . Prostate cancer ([place]) 12/08/2014  . Pruritic dermatitis 12/08/2014  . Chronic obstructive pulmonary emphysema ([place]) 12/08/2014  . Lung mass 12/08/2014  . Sepsis ([place]) 11/24/2014  . Pneumonia 11/24/2014  . COPD exacerbation ([place]) 11/22/2014  . Personal history of colonic polyps 01/08/2013    Past Surgical History:  Procedure Laterality Date  . Bone density test     Spine T-spine = -1.7  . COLONOSCOPY  03/12/13  . COLONOSCOPY  2011  . ELECTROMAGNETIC NAVIGATION BROCHOSCOPY N/A 01/07/2015   Procedure: ELECTROMAGNETIC NAVIGATION BRONCHOSCOPY;  Surgeon: Vilinda Boehringer, MD;  Location: [facility] ORS;  Service: Cardiopulmonary;  Laterality: N/A;  . HERNIA REPAIR  2011  . PROSTATE SURGERY  1994  . surgery for staph infection  1995    Prior to Admission medications   Medication Sig Start Date End Date Taking? Authorizing Provider  albuterol (PROVENTIL HFA;VENTOLIN HFA) 108 (90 BASE) MCG/ACT inhaler Inhale 2 puffs into the lungs every 6 (six) hours as needed for wheezing or shortness of breath.     Historical Provider, MD  alendronate (FOSAMAX) 70 MG tablet TAKE ONE TABLET BY MOUTH ONCE A WEEK 07/01/15   Vickki Muff Chrismon, PA  aspirin EC 81 MG tablet Take 162 mg by mouth every 4 (four) hours as needed for mild pain.     Historical Provider, MD  azithromycin [facility])  250 MG tablet TAKE ONE TABLET BY MOUTH ONCE DAILY 12/06/15   Flora Lipps, MD  Calcium Carbonate-Vitamin D (CALCIUM 600+D) 600-400 MG-UNIT tablet Take 1 tablet by mouth 2 (two) times daily.    Historical Provider, MD  cholecalciferol (VITAMIN D) 1000 UNITS tablet Take 2,000 Units by mouth daily.     Historical Provider, MD  Cranberry-Vitamin C-Probiotic (AZO CRANBERRY) 250-30 MG TABS Take 2 tablets by mouth every evening.    Historical Provider, MD    Fluticasone-Salmeterol (ADVAIR DISKUS) 500-50 MCG/DOSE AEPB Inhale 1 puff into the lungs 2 (two) times daily. 08/19/15   Flora Lipps, MD  ipratropium-albuterol (DUONEB) 0.5-2.5 (3) MG/3ML SOLN USE ONE VIAL IN NEBULIZER 4 TIMES DAILY 07/01/15   Vickki Muff Chrismon, PA  Leuprolide Acetate (LUPRON IJ) Inject 1 Dose as directed every 3 (three) months.     Historical Provider, MD  metFORMIN (GLUCOPHAGE) 500 MG tablet Take 1 tablet (500 mg total) by mouth 2 (two) times daily with a meal. 12/13/15   Vickki Muff Chrismon, PA  metoprolol tartrate (LOPRESSOR) 25 MG tablet Take 1 tablet (25 mg total) by mouth 3 (three) times daily. 11/18/15   Wende Bushy, MD  Omega-3 Fatty Acids (FISH OIL) 1000 MG CAPS Take 2,000 mg by mouth daily.     Historical Provider, MD  pravastatin (PRAVACHOL) 40 MG tablet TAKE ONE TABLET BY MOUTH ONCE DAILY 01/16/16   Birdie Sons, MD  predniSONE (DELTASONE) 20 MG tablet TAKE ONE TABLET BY MOUTH ONCE DAILY WITH BREAKFAST 12/20/15   Flora Lipps, MD  tiotropium (SPIRIVA) 18 MCG inhalation capsule Place 18 mcg into inhaler and inhale daily.    Historical Provider, MD    Allergies Review of patient's allergies indicates no known allergies.  Family History  Problem Relation Age of Onset  . Cancer Father     bladder  . Asthma Mother   . Osteoporosis Mother   . Other Sister     brain tumor  . Cancer Brother     colon    Social History Social History  Substance Use Topics  . Smoking status: Former Smoker    Packs/day: 1.00    Types: Cigarettes    Quit date: 05/15/2006  . Smokeless tobacco: Never Used  . Alcohol use No    Review of Systems  Constitutional: No fever/chills. Eyes: No visual changes. ENT: No sore throat. Cardiovascular: Positive for chest pain. Respiratory: Positive for chronic shortness of breath. Gastrointestinal: No abdominal pain.  No nausea, no vomiting.  No diarrhea.  No constipation. Genitourinary: Negative for dysuria. Musculoskeletal: Negative for  back pain. Skin: Negative for rash. Neurological: Negative for headaches, focal weakness or numbness.  10-point ROS otherwise negative.  ____________________________________________   PHYSICAL EXAM:  VITAL SIGNS: ED Triage Vitals [01/20/16 2122]  Enc Vitals Group     BP 114/73     Pulse Rate 77     Resp (!) 22     Temp 97.7 F (36.5 C)     Temp Source Oral     SpO2 93 %     Weight 162 lb (73.5 kg)     Height 6' (1.829 m)     Head Circumference      Peak Flow      Pain Score 6     Pain Loc      Pain Edu?      Excl. in [place]?     Constitutional: Alert and oriented. Well appearing and in mild acute distress. Eyes: Conjunctivae are normal.  PERRL. EOMI. Head: Atraumatic. Nose: No congestion/rhinnorhea. Mouth/Throat: Mucous membranes are moist.  Oropharynx non-erythematous. Neck: No stridor.  No cervical spine tenderness to palpation. Cardiovascular: Normal rate, regular rhythm. Grossly normal heart sounds.  Good peripheral circulation. Respiratory: Normal respiratory effort.  No retractions. Lungs diminished bibasilarly, otherwise CTAB. There is no ecchymosis or external evidence of injury. Left upper anterior chest wall tender to palpation. Gastrointestinal: Soft and nontender. No distention. No abdominal bruits. No CVA tenderness. Musculoskeletal: No lower extremity tenderness nor edema.  No joint effusions. Neurologic:  Normal speech and language. No gross focal neurologic deficits are appreciated. No gait instability. Skin:  Skin is warm, dry and intact. No rash noted. Psychiatric: Mood and affect are normal. Speech and behavior are normal.  ____________________________________________   LABS (all labs ordered are listed, but only abnormal results are displayed)  Labs Reviewed - No data to display ____________________________________________  EKG  ED ECG REPORT I, SUNG,JADE J, the attending physician, personally viewed and interpreted this ECG.   Date:  01/21/2016  EKG Time: 2122  Rate: 81  Rhythm: normal EKG, normal sinus rhythm  Axis: Normal  Intervals:none  ST&T Change: Nonspecific  ____________________________________________  RADIOLOGY  Chest 2 view (viewed by me, interpreted per Dr. Pascal Lux): 1. Similar findings of marked bullous emphysematous change of the lungs, asymmetrically evolving the left upper lobe without acute cardiopulmonary disease. Emphysema. (ICD10-J43.9) 2. No definite displaced left-sided rib fractures. No definite left-sided pneumothorax. 3. Hiatal hernia.  CT chest interpreted per Dr. Quintella Reichert: Severe emphysema with evidence of air trapping in the left lower lobe. A 1.5 cm right upper lobe nodule along the minor fissure is concerning for neoplasm. Further evaluation with PET-CT recommended. Multiple nodular densities in the right upper and right middle lobes, possibly infectious/ inflammatory in etiology. Metastatic disease is not excluded. Mucus secretions in the left upper lobe bronchus with focal area of consolidation in the lingula likely postobstructive atelectasis/pneumonia. L1 compression fracture, new from prior study. Clinical correlation is recommended. ____________________________________________   PROCEDURES  Procedure(s) performed: None  Procedures  Critical Care performed: No  ____________________________________________   INITIAL IMPRESSION / ASSESSMENT AND PLAN / ED COURSE  Pertinent labs & imaging results that were available during my care of the patient were reviewed by me and considered in my medical decision making (see chart for details).  82 year old male with a history of COPD on continuous supplemental oxygen who presents with left anterior chest wall pain after striking it against his walker. Initial two-view chest x-ray is negative for rib fracture or pneumothorax; however, given patient's advanced COPD disease and bullae, will proceed with CT noncontrast chest to  evaluate for small pneumothorax.  Clinical Course  Comment By Time  Updated patient's of CT imaging results. He is very eager for discharge secondary to his long wait and declines lab work which I recommended for probable pneumonia seen on CT scan. Given the recent black box warning for fluoroquinolones, will treat community-acquired pneumonia with Augmentin. Will discharge home with prescription for Norco. Patient will follow up closely with his PCP. Strict return precautions given. Patient and daughter verbalize understanding and agree with plan of care. Paulette Blanch, MD 07/28 0250     ____________________________________________   FINAL CLINICAL IMPRESSION(S) / ED DIAGNOSES  Final diagnoses:  Chest wall pain  Fall at home, initial encounter  CAP (community acquired pneumonia)  Compression fracture      NEW MEDICATIONS STARTED DURING THIS VISIT:  New Prescriptions   No medications on  file     Note:  This document was prepared using Dragon voice recognition software and may include unintentional dictation errors.    Paulette Blanch, MD 01/21/16 [phone]

## 2016-01-21 NOTE — ED Notes (Signed)
Pt found in room att

## 2016-02-09 ENCOUNTER — Encounter: Payer: Self-pay | Admitting: Internal Medicine

## 2016-02-09 ENCOUNTER — Ambulatory Visit (INDEPENDENT_AMBULATORY_CARE_PROVIDER_SITE_OTHER): Payer: PPO | Admitting: Internal Medicine

## 2016-02-09 VITALS — BP 110/60 | HR 86 | Ht 72.0 in | Wt 154.6 lb

## 2016-02-09 DIAGNOSIS — J9611 Chronic respiratory failure with hypoxia: Secondary | ICD-10-CM

## 2016-02-09 NOTE — Patient Instructions (Addendum)
Continue inhalers as prescibed On prednisone and azithromycin Follow up in 3 months Continue oxygen          Chronic Obstructive Pulmonary Disease Chronic obstructive pulmonary disease (COPD) is a common lung condition in which airflow from the lungs is limited. COPD is a general term that can be used to describe many different lung problems that limit airflow, including both chronic bronchitis and emphysema. If you have COPD, your lung function will probably never return to normal, but there are measures you can take to improve lung function and make yourself feel better. CAUSES   Smoking (common).  Exposure to secondhand smoke.  Genetic problems.  Chronic inflammatory lung diseases or recurrent infections. SYMPTOMS  Shortness of breath, especially with physical activity.  Deep, persistent (chronic) cough with a large amount of thick mucus.  Wheezing.  Rapid breaths (tachypnea).  Gray or bluish discoloration (cyanosis) of the skin, especially in your fingers, toes, or lips.  Fatigue.  Weight loss.  Frequent infections or episodes when breathing symptoms become much worse (exacerbations).  Chest tightness. DIAGNOSIS Your health care provider will take a medical history and perform a physical examination to diagnose COPD. Additional tests for COPD may include:  Lung (pulmonary) function tests.  Chest X-ray.  CT scan.  Blood tests. TREATMENT  Treatment for COPD may include:  Inhaler and nebulizer medicines. These help manage the symptoms of COPD and make your breathing more comfortable.  Supplemental oxygen. Supplemental oxygen is only helpful if you have a low oxygen level in your blood.  Exercise and physical activity. These are beneficial for nearly all people with COPD.  Lung surgery or transplant.  Nutrition therapy to gain weight, if you are underweight.  Pulmonary rehabilitation. This may involve working with a team of health care providers and  specialists, such as respiratory, occupational, and physical therapists. HOME CARE INSTRUCTIONS  Take all medicines (inhaled or pills) as directed by your health care provider.  Avoid over-the-counter medicines or cough syrups that dry up your airway (such as antihistamines) and slow down the elimination of secretions unless instructed otherwise by your health care provider.  If you are a smoker, the most important thing that you can do is stop smoking. Continuing to smoke will cause further lung damage and breathing trouble. Ask your health care provider for help with quitting smoking. He or she can direct you to community resources or hospitals that provide support.  Avoid exposure to irritants such as smoke, chemicals, and fumes that aggravate your breathing.  Use oxygen therapy and pulmonary rehabilitation if directed by your health care provider. If you require home oxygen therapy, ask your health care provider whether you should purchase a pulse oximeter to measure your oxygen level at home.  Avoid contact with individuals who have a contagious illness.  Avoid extreme temperature and humidity changes.  Eat healthy foods. Eating smaller, more frequent meals and resting before meals may help you maintain your strength.  Stay active, but balance activity with periods of rest. Exercise and physical activity will help you maintain your ability to do things you want to do.  Preventing infection and hospitalization is very important when you have COPD. Make sure to receive all the vaccines your health care provider recommends, especially the pneumococcal and influenza vaccines. Ask your health care provider whether you need a pneumonia vaccine.  Learn and use relaxation techniques to manage stress.  Learn and use controlled breathing techniques as directed by your health care provider. Controlled breathing techniques  include:  Pursed lip breathing. Start by breathing in (inhaling) through  your nose for 1 second. Then, purse your lips as if you were going to whistle and breathe out (exhale) through the pursed lips for 2 seconds.  Diaphragmatic breathing. Start by putting one hand on your abdomen just above your waist. Inhale slowly through your nose. The hand on your abdomen should move out. Then purse your lips and exhale slowly. You should be able to feel the hand on your abdomen moving in as you exhale.  Learn and use controlled coughing to clear mucus from your lungs. Controlled coughing is a series of short, progressive coughs. The steps of controlled coughing are: 1. Lean your head slightly forward. 2. Breathe in deeply using diaphragmatic breathing. 3. Try to hold your breath for 3 seconds. 4. Keep your mouth slightly open while coughing twice. 5. Spit any mucus out into a tissue. 6. Rest and repeat the steps once or twice as needed. SEEK MEDICAL CARE IF:  You are coughing up more mucus than usual.  There is a change in the color or thickness of your mucus.  Your breathing is more labored than usual.  Your breathing is faster than usual. SEEK IMMEDIATE MEDICAL CARE IF:  You have shortness of breath while you are resting.  You have shortness of breath that prevents you from:  Being able to talk.  Performing your usual physical activities.  You have chest pain lasting longer than 5 minutes.  Your skin color is more cyanotic than usual.  You measure low oxygen saturations for longer than 5 minutes with a pulse oximeter. MAKE SURE YOU:  Understand these instructions.  Will watch your condition.  Will get help right away if you are not doing well or get worse.   This information is not intended to replace advice given to you by your health care provider. Make sure you discuss any questions you have with your health care provider.   Document Released: 03/22/2005 Document Revised: 07/03/2014 Document Reviewed: 02/06/2013 Elsevier Interactive Patient  Education [employer].

## 2016-02-09 NOTE — Progress Notes (Addendum)
MRN# [id number] Alan Weaver 25-Oct-1926   CC: chornic SOB, DOE, follow up COPD, lung cancer   HPI:  Patient with COPD-DOE and SOB with exertion  Currently on  prednisone  20 mg daily On advair and spiriva, on daily azithromycin  Completed SBRT on 02/22/15 for RUL mass. Still with persistent gradual increase with DOE. Uses oxygen with exertion No signs of infection at this time, ONO reveals  he needs oxygen at night Last ECHO in may 2016 showed grade 1 diastolic dysfunction, current January 2017 ECHO shows EF 50% and grade 1 Diastolic dys Repeat CT chest and follow up rad oncology in 2 months   Review of Systems: Gen:  Denies  fever, sweats, chills HEENT: Denies blurred vision, double vision, ear pain, eye pain, hearing loss, nose bleeds, sore throat Cvc:  No dizziness, chest pain or heaviness Resp:   Admits VA:NVBTYOM sob and sputum production, DOE Ext:   No Joint pain, stiffness or swelling Other:  All other systems negative  Allergies:  Review of patient's allergies indicates no known allergies.  Physical Examination:  VS: There were no vitals taken for this visit.  General Appearance: minimal distress  HEENT: PERRLA, no ptosis, no other lesions noticed, pursed lip breathing Pulmonary:distant  breath sounds., diaphragmatic excursion normal.No wheezing, No rales , mild dec in BS at the bases.  Cardiovascular:  Normal S1,S2.  No m/r/g.     Abdomen:Exam: Benign, Soft, non-tender, No masses  Skin:   warm, no rashes, no ecchymosis  Extremities: normal, no cyanosis, +edema  Biopsy result from transbronch forcepts 01/07/15 DIAGNOSIS:  A. LUNG, RIGHT UPPER LOBE; ENB FORCEPS BIOPSY:  - NON-SMALL CELL CARCINOMA, FAVOR SQUAMOUS CELL CARCINOMA.   CT chest in 12/2015 shows decrease in RUL nodule approx 15 MM from 17MM from previous CT Images reviewed 02/09/2016   Assessment and Plan:82 yo with COPD end stage with chronic resp insufficiency, s\p bronch with biopsy for RUL mass,  found to be NSCLC , Patient with chronic worsening of SOB/DOE. Patient with severe end stage COPD and resp insufficiency GOLD STAGE D  1.continuie  Advair and Spiriva, will check proper technique and usage, advised to rinse mouth after each use 2.continue oxygen as prescribed  3.cont azithromycin daily to prevent acute COPD exacerbations 4.continue dounebs as prescribed every 4 hrs 5.continue prednisone as prescribed 6.follow up cardiology referral  7.follow up rad oncology in 6 months with repeat CT chest   Follow up in 3 months  Prognosis is  poor, poor quality of life with resp status.  The Patient requires high complexity decision making for assessment and support, frequent evaluation and titration of therapies. Patient/Family are satisfied with Plan of action and management. All questions answered  Corrin Parker, M.D.  [facility]  Medical Director [place] Director [facility] Cardio-Pulmonary Department

## 2016-02-21 ENCOUNTER — Other Ambulatory Visit: Payer: Self-pay

## 2016-02-21 MED ORDER — FLUTICASONE-SALMETEROL 500-50 MCG/DOSE IN AEPB: 1.0000 | INHALATION_SPRAY | Freq: Two times a day (BID) | RESPIRATORY_TRACT | 5 refills | Status: AC

## 2016-03-14 ENCOUNTER — Encounter: Payer: Self-pay | Admitting: Family Medicine

## 2016-03-14 ENCOUNTER — Ambulatory Visit (INDEPENDENT_AMBULATORY_CARE_PROVIDER_SITE_OTHER): Payer: PPO | Admitting: Family Medicine

## 2016-03-14 VITALS — BP 122/58 | HR 87 | Temp 97.6°F | Resp 16

## 2016-03-14 DIAGNOSIS — I10 Essential (primary) hypertension: Secondary | ICD-10-CM

## 2016-03-14 DIAGNOSIS — C61 Malignant neoplasm of prostate: Secondary | ICD-10-CM

## 2016-03-14 DIAGNOSIS — J432 Centrilobular emphysema: Secondary | ICD-10-CM | POA: Diagnosis not present

## 2016-03-14 DIAGNOSIS — E1142 Type 2 diabetes mellitus with diabetic polyneuropathy: Secondary | ICD-10-CM

## 2016-03-14 DIAGNOSIS — C3491 Malignant neoplasm of unspecified part of right bronchus or lung: Secondary | ICD-10-CM

## 2016-03-14 DIAGNOSIS — Z23 Encounter for immunization: Secondary | ICD-10-CM | POA: Diagnosis not present

## 2016-03-14 NOTE — Progress Notes (Signed)
Patient: Alan Weaver Male    DOB: 1926/12/03   82 y.o.   MRN: [id number] Visit Date: 03/14/2016  Today's Provider: Vernie Murders, PA   Chief Complaint  Patient presents with  . Hypertension  . Hyperlipidemia  . Diabetes  . COPD  . Follow-up   Subjective:    HPI  Diabetes Mellitus Type II, Follow-up:   Lab Results  Component Value Date   HGBA1C 8.0 (H) 12/13/2015   HGBA1C 7.8 (H) 09/10/2015   HGBA1C 7.0 (H) 06/20/2015   Last seen for diabetes 3 months ago.  Management since then includes continue Metformin 500 mg. He reports excellent compliance with treatment. He is not having side effects.  Current symptoms include none and have been stable. Home blood sugar records: fasting range: 140-150's  Episodes of hypoglycemia? no   Current Insulin Regimen: none Weight trend: stable Current diet: well balanced Current exercise: none  ------------------------------------------------------------------------   Hypertension, follow-up:  BP Readings from Last 3 Encounters:  03/14/16 (!) 122/58  02/09/16 110/60  01/21/16 134/80    He was last seen for hypertension 3 months ago.  BP at that visit was 132/60. Management since that visit includes continue Metoprolol.He reports excellent compliance with treatment. He is not having side effects.  He is not exercising. He is adherent to low salt diet.   Outside blood pressures are not being checked. He is experiencing none.  Patient denies none.   Cardiovascular risk factors include advanced age (older than 3 for men, 4 for women), diabetes mellitus, dyslipidemia, hypertension and male gender.  Use of agents associated with hypertension: none.   ------------------------------------------------------------------------    Lipid/Cholesterol, Follow-up:   Last seen for this 3 months ago.  Management since that visit includes continue Pravastatin.  Last Lipid Panel:    Component Value Date/Time   CHOL 212 (H)  12/13/2015 1048   TRIG 178 (H) 12/13/2015 1048   HDL 75 12/13/2015 1048   CHOLHDL 2.8 12/13/2015 1048   LDLCALC 101 (H) 12/13/2015 1048    He reports excellent compliance with treatment. He is not having side effects.   Wt Readings from Last 3 Encounters:  02/09/16 154 lb 9.6 oz (70.1 kg)  01/20/16 162 lb (73.5 kg)  01/17/16 154 lb 8.7 oz (70.1 kg)    ------------------------------------------------------------------------ Past Medical History:  Diagnosis Date  . Cancer [facility]) 1994   prostate, then lung  . Chronic kidney disease    KIDNEY STONES  . COPD (chronic obstructive pulmonary disease) ([place])   . Diabetes mellitus without complication ([place])   . GERD (gastroesophageal reflux disease)   . Heart murmur   . High blood cholesterol level   . Hypertension   . Incontinence   . Irritable bowel syndrome   . On home oxygen therapy    2L [place] PRN  . Osteoporosis   . Paroxysmal a-fib ([place])   . Pneumonia    Past Surgical History:  Procedure Laterality Date  . Bone density test     Spine T-spine = -1.7  . COLONOSCOPY  03/12/13  . COLONOSCOPY  2011  . ELECTROMAGNETIC NAVIGATION BROCHOSCOPY N/A 01/07/2015   Procedure: ELECTROMAGNETIC NAVIGATION BRONCHOSCOPY;  Surgeon: Vilinda Boehringer, MD;  Location: [facility] ORS;  Service: Cardiopulmonary;  Laterality: N/A;  . HERNIA REPAIR  2011  . PROSTATE SURGERY  1994  . surgery for staph infection  1995   Family History  Problem Relation Age of Onset  . Cancer Father     bladder  . Asthma Mother   .  Osteoporosis Mother   . Other Sister     brain tumor  . Cancer Brother     colon   No Known Allergies   Previous Medications   ALBUTEROL (PROVENTIL HFA;VENTOLIN HFA) 108 (90 BASE) MCG/ACT INHALER    Inhale 2 puffs into the lungs every 6 (six) hours as needed for wheezing or shortness of breath.    ALENDRONATE (FOSAMAX) 70 MG TABLET    TAKE ONE TABLET BY MOUTH ONCE A WEEK   ASPIRIN EC 81 MG TABLET    Take 162 mg by mouth every 4 (four)  hours as needed for mild pain.    AZITHROMYCIN (ZITHROMAX) 250 MG TABLET    TAKE ONE TABLET BY MOUTH ONCE DAILY   CALCIUM CARBONATE-VITAMIN D (CALCIUM 600+D) 600-400 MG-UNIT TABLET    Take 1 tablet by mouth 2 (two) times daily.   CHOLECALCIFEROL (VITAMIN D) 1000 UNITS TABLET    Take 2,000 Units by mouth daily.    CRANBERRY-VITAMIN C-PROBIOTIC (AZO CRANBERRY) 250-30 MG TABS    Take 2 tablets by mouth every evening.   FLUTICASONE-SALMETEROL (ADVAIR DISKUS) 500-50 MCG/DOSE AEPB    Inhale 1 puff into the lungs 2 (two) times daily.   HYDROCODONE-ACETAMINOPHEN (NORCO) 5-325 MG TABLET    Take 1 tablet by mouth every 6 (six) hours as needed for moderate pain.   IPRATROPIUM-ALBUTEROL (DUONEB) 0.5-2.5 (3) MG/3ML SOLN    USE ONE VIAL IN NEBULIZER 4 TIMES DAILY   LEUPROLIDE ACETATE (LUPRON IJ)    Inject 1 Dose as directed every 3 (three) months.    METFORMIN (GLUCOPHAGE) 500 MG TABLET    Take 1 tablet (500 mg total) by mouth 2 (two) times daily with a meal.   METOPROLOL TARTRATE (LOPRESSOR) 25 MG TABLET    Take 1 tablet (25 mg total) by mouth 3 (three) times daily.   OMEGA-3 FATTY ACIDS (FISH OIL) 1000 MG CAPS    Take 2,000 mg by mouth daily.    PRAVASTATIN (PRAVACHOL) 40 MG TABLET    TAKE ONE TABLET BY MOUTH ONCE DAILY   PREDNISONE (DELTASONE) 20 MG TABLET    TAKE ONE TABLET BY MOUTH ONCE DAILY WITH BREAKFAST   TIOTROPIUM (SPIRIVA) 18 MCG INHALATION CAPSULE    Place 18 mcg into inhaler and inhale daily.    Review of Systems  Constitutional: Negative.   Respiratory: Positive for shortness of breath.   Cardiovascular: Negative.   Gastrointestinal: Negative.   Endocrine: Negative.   Musculoskeletal: Negative.     Social History  Substance Use Topics  . Smoking status: Former Smoker    Packs/day: 1.00    Types: Cigarettes    Quit date: 05/15/2006  . Smokeless tobacco: Never Used  . Alcohol use No   Objective:   BP (!) 122/58 (BP Location: Right Arm, Patient Position: Sitting, Cuff Size: Normal)    Pulse 87   Temp 97.6 F (36.4 C) (Oral)   Resp 16   SpO2 97%   Physical Exam  Constitutional: He is oriented to person, place, and time. He appears well-developed and well-nourished. No distress.  HENT:  Head: Normocephalic and atraumatic.  Right Ear: Hearing normal.  Left Ear: Hearing normal.  Nose: Nose normal.  Eyes: Conjunctivae and lids are normal. Right eye exhibits no discharge. Left eye exhibits no discharge. No scleral icterus.  Neck: Neck supple.  Cardiovascular: Normal rate and regular rhythm.   Pulmonary/Chest: He is in respiratory distress.  Very distant breath sounds and labored breathing.  Abdominal: Soft. Bowel sounds are  normal.  Neurological: He is alert and oriented to person, place, and time.  Skin: Skin is intact. No lesion and no rash noted. There is pallor.  Psychiatric: He has a normal mood and affect. His speech is normal and behavior is normal. Thought content normal.      Assessment & Plan:     1. DM type 2 with diabetic peripheral neuropathy (HCC) Tolerating Meftormin and trying to control diet. Unable to be physically active much due to severe emphysema. Will check labs and follow up in 4 months. - Hemoglobin A1c - CBC with Differential/Platelet - Comprehensive metabolic panel - Lipid panel  2. Essential hypertension Well controlled today. Tolerating Metoprolol 25 mg TID without significant side effects. No chest pain or palpitations. Recheck routine labs. - CBC with Differential/Platelet - Comprehensive metabolic panel - Lipid panel  3. Non-small cell cancer of right lung [facility]) Had follow up CT scans the end of July 2017. States oncologist felt the lung nodule were smaller and only scar tissue now. Will be getting PET scan soon for further evaluation. Continue follow up with oncologist as planned. - CBC with Differential/Platelet  4. Centrilobular emphysema ([place]) Still taking home nebulizer treatments with Duoneb, using Advair 500-50 BID and  oxygen 2-3 LPM per pulmonologist. Occasionally uses Z-pak and Prednisone for flares. Breathing labored today but he feels it is as good as it can be. Prognosis not good - endstage emphysema. Continue follow up with pulmonologist. - CBC with Differential/Platelet  5. Prostate cancer [facility]) Followed routinely by oncologist for Lupron injections.  6. Need for influenza vaccination - Flu vaccine HIGH DOSE PF

## 2016-03-15 ENCOUNTER — Telehealth: Payer: Self-pay | Admitting: Family Medicine

## 2016-03-15 DIAGNOSIS — J432 Centrilobular emphysema: Secondary | ICD-10-CM

## 2016-03-15 DIAGNOSIS — J9621 Acute and chronic respiratory failure with hypoxia: Secondary | ICD-10-CM

## 2016-03-15 DIAGNOSIS — C3491 Malignant neoplasm of unspecified part of right bronchus or lung: Secondary | ICD-10-CM

## 2016-03-15 DIAGNOSIS — C61 Malignant neoplasm of prostate: Secondary | ICD-10-CM

## 2016-03-15 DIAGNOSIS — J441 Chronic obstructive pulmonary disease with (acute) exacerbation: Secondary | ICD-10-CM

## 2016-03-15 LAB — CBC WITH DIFFERENTIAL/PLATELET
BASOS ABS: 0 10*3/uL (ref 0.0–0.2)
Basos: 0 %
EOS (ABSOLUTE): 0 10*3/uL (ref 0.0–0.4)
Eos: 0 %
Hematocrit: 39.9 % (ref 37.5–51.0)
Hemoglobin: 13.1 g/dL (ref 12.6–17.7)
IMMATURE GRANS (ABS): 0 10*3/uL (ref 0.0–0.1)
IMMATURE GRANULOCYTES: 0 %
LYMPHS: 11 %
Lymphocytes Absolute: 1.1 10*3/uL (ref 0.7–3.1)
MCH: 29.6 pg (ref 26.6–33.0)
MCHC: 32.8 g/dL (ref 31.5–35.7)
MCV: 90 fL (ref 79–97)
Monocytes Absolute: 0.8 10*3/uL (ref 0.1–0.9)
Monocytes: 8 %
NEUTROS PCT: 81 %
Neutrophils Absolute: 8.3 10*3/uL — ABNORMAL HIGH (ref 1.4–7.0)
PLATELETS: 254 10*3/uL (ref 150–379)
RBC: 4.42 x10E6/uL (ref 4.14–5.80)
RDW: 14.9 % (ref 12.3–15.4)
WBC: 10.3 10*3/uL (ref 3.4–10.8)

## 2016-03-15 LAB — LIPID PANEL
CHOL/HDL RATIO: 2.3 ratio (ref 0.0–5.0)
Cholesterol, Total: 188 mg/dL (ref 100–199)
HDL: 82 mg/dL (ref >39)
LDL Calculated: 81 mg/dL (ref 0–99)
TRIGLYCERIDES: 123 mg/dL (ref 0–149)
VLDL CHOLESTEROL CAL: 25 mg/dL (ref 5–40)

## 2016-03-15 LAB — COMPREHENSIVE METABOLIC PANEL
ALT: 7 IU/L (ref 0–44)
AST: 11 IU/L (ref 0–40)
Albumin/Globulin Ratio: 1.6 (ref 1.2–2.2)
Albumin: 3.6 g/dL (ref 3.5–4.7)
Alkaline Phosphatase: 50 IU/L (ref 39–117)
BUN/Creatinine Ratio: 23 (ref 10–24)
BUN: 21 mg/dL (ref 8–27)
Bilirubin Total: 0.3 mg/dL (ref 0.0–1.2)
CALCIUM: 9.2 mg/dL (ref 8.6–10.2)
CHLORIDE: 99 mmol/L (ref 96–106)
CO2: 32 mmol/L — ABNORMAL HIGH (ref 18–29)
Creatinine, Ser: 0.92 mg/dL (ref 0.76–1.27)
GFR, EST AFRICAN AMERICAN: 86 mL/min/{1.73_m2} (ref >59)
GFR, EST NON AFRICAN AMERICAN: 75 mL/min/{1.73_m2} (ref >59)
GLUCOSE: 134 mg/dL — AB (ref 65–99)
Globulin, Total: 2.3 g/dL (ref 1.5–4.5)
Potassium: 4.4 mmol/L (ref 3.5–5.2)
Sodium: 144 mmol/L (ref 134–144)
TOTAL PROTEIN: 5.9 g/dL — AB (ref 6.0–8.5)

## 2016-03-15 LAB — HEMOGLOBIN A1C
ESTIMATED AVERAGE GLUCOSE: 174 mg/dL
HEMOGLOBIN A1C: 7.7 % — AB (ref 4.8–5.6)

## 2016-03-15 NOTE — Telephone Encounter (Signed)
Pt's daughter Butch Penny called requesting a referral for her dad to Hospice.  Her call back number [phone].  He seen Simona Huh in the office yesterday.  She spoke with Hospice and they told her to contact us.  Thank sTeri

## 2016-03-15 NOTE — Telephone Encounter (Signed)
Please order hospice referral for end stage COPD and lung cancer.

## 2016-03-15 NOTE — Telephone Encounter (Signed)
Please enter order for hospice

## 2016-03-15 NOTE — Telephone Encounter (Signed)
Please advise 

## 2016-03-16 ENCOUNTER — Other Ambulatory Visit: Payer: Self-pay

## 2016-03-16 DIAGNOSIS — C3491 Malignant neoplasm of unspecified part of right bronchus or lung: Secondary | ICD-10-CM

## 2016-03-16 DIAGNOSIS — J449 Chronic obstructive pulmonary disease, unspecified: Secondary | ICD-10-CM

## 2016-03-16 NOTE — Telephone Encounter (Signed)
Referral was ordered yesterday. Please check with sarah regarding status of referral

## 2016-03-16 NOTE — Telephone Encounter (Signed)
Patient's daughter Berdie Ogren called to check the status of hospice referral. Butch Penny is requesting a call back on her cell her [phone] when referral is complete.

## 2016-03-16 NOTE — Telephone Encounter (Signed)
Please schedule referral for Hospice? Thanks!

## 2016-03-17 ENCOUNTER — Telehealth: Payer: Self-pay

## 2016-03-17 NOTE — Telephone Encounter (Signed)
-----   Message from Margo Common, [place] sent at 03/16/2016  3:20 PM EDT ----- Normal blood cell counts and cholesterol. Hgb A1C better than 3 months ago but blood sugar higher. Watch diet and continue all medications. Recheck in 3-4 months.

## 2016-03-17 NOTE — Telephone Encounter (Signed)
Patient advised.

## 2016-03-17 NOTE — Telephone Encounter (Signed)
Advised patient that referral has been completed and per Judson Roch, their office will be contacting the patient.

## 2016-03-20 ENCOUNTER — Telehealth: Payer: Self-pay | Admitting: Family Medicine

## 2016-03-20 ENCOUNTER — Other Ambulatory Visit: Payer: Self-pay | Admitting: Family Medicine

## 2016-03-20 DIAGNOSIS — J432 Centrilobular emphysema: Secondary | ICD-10-CM

## 2016-03-20 DIAGNOSIS — C3491 Malignant neoplasm of unspecified part of right bronchus or lung: Secondary | ICD-10-CM

## 2016-03-20 MED ORDER — LORAZEPAM 0.5 MG PO TABS: 0.5000 mg | ORAL_TABLET | ORAL | 1 refills | Status: AC | PRN

## 2016-03-20 MED ORDER — MORPHINE SULFATE (CONCENTRATE) 10 MG /0.5 ML PO SOLN: 10.0000 mg | ORAL | 0 refills | Status: DC | PRN

## 2016-03-20 MED ORDER — MORPHINE SULFATE (CONCENTRATE) 10 MG /0.5 ML PO SOLN: 10.0000 mg | ORAL | 0 refills | Status: AC | PRN

## 2016-03-20 NOTE — Progress Notes (Signed)
Pharmacy requested change of Roxanol from 10 ml to 30 ml bottle for this Hospice Patient.

## 2016-03-20 NOTE — Telephone Encounter (Signed)
Jenni with [facility] called to stating she went to see pt today and pt is in distress.  Pt is not able to breath.  Tawanna Sat is requesting a Rx.  Roxanol '20mg'$  per ml 0.05 mg ervery 1 to 2 hours  Ativan 0.'5mg'$  4to 6 hours prn for anxiety  [place].  Write Hospice pt on the Rx.  WG#[phone]/HQ

## 2016-03-20 NOTE — Telephone Encounter (Signed)
Advise Alan Weaver with Hospice prescription ready for pick up.

## 2016-03-20 NOTE — Telephone Encounter (Signed)
Per Tawanna Sat with Hospice faxed patient's RX to [facility]. Tawanna Sat said as long as RX states patient is a hospice patient they will fill controlled RX when faxed.

## 2016-03-26 DEATH — deceased

## 2016-05-23 ENCOUNTER — Ambulatory Visit: Payer: PPO

## 2016-05-23 ENCOUNTER — Ambulatory Visit: Payer: PPO | Admitting: Oncology

## 2016-05-23 ENCOUNTER — Other Ambulatory Visit: Payer: PPO

## 2016-06-10 ENCOUNTER — Other Ambulatory Visit: Payer: Self-pay | Admitting: Nurse Practitioner

## 2016-07-11 ENCOUNTER — Ambulatory Visit: Payer: PPO | Admitting: Family Medicine

## 2016-07-12 ENCOUNTER — Ambulatory Visit: Payer: PPO

## 2016-07-19 ENCOUNTER — Ambulatory Visit: Payer: PPO | Admitting: Radiation Oncology

## 2016-11-24 IMAGING — CR DG CHEST 1V PORT
1 series · 2 of 2 positions shown · non-contrast
Comparison: 06/07/2013

CLINICAL DATA: Shortness of breath.  Known COPD.

EXAM:
PORTABLE CHEST - 1 VIEW

[Series 1: ap · 0.17mm/px · 2 of 2 slices shown]
[im 1/2]
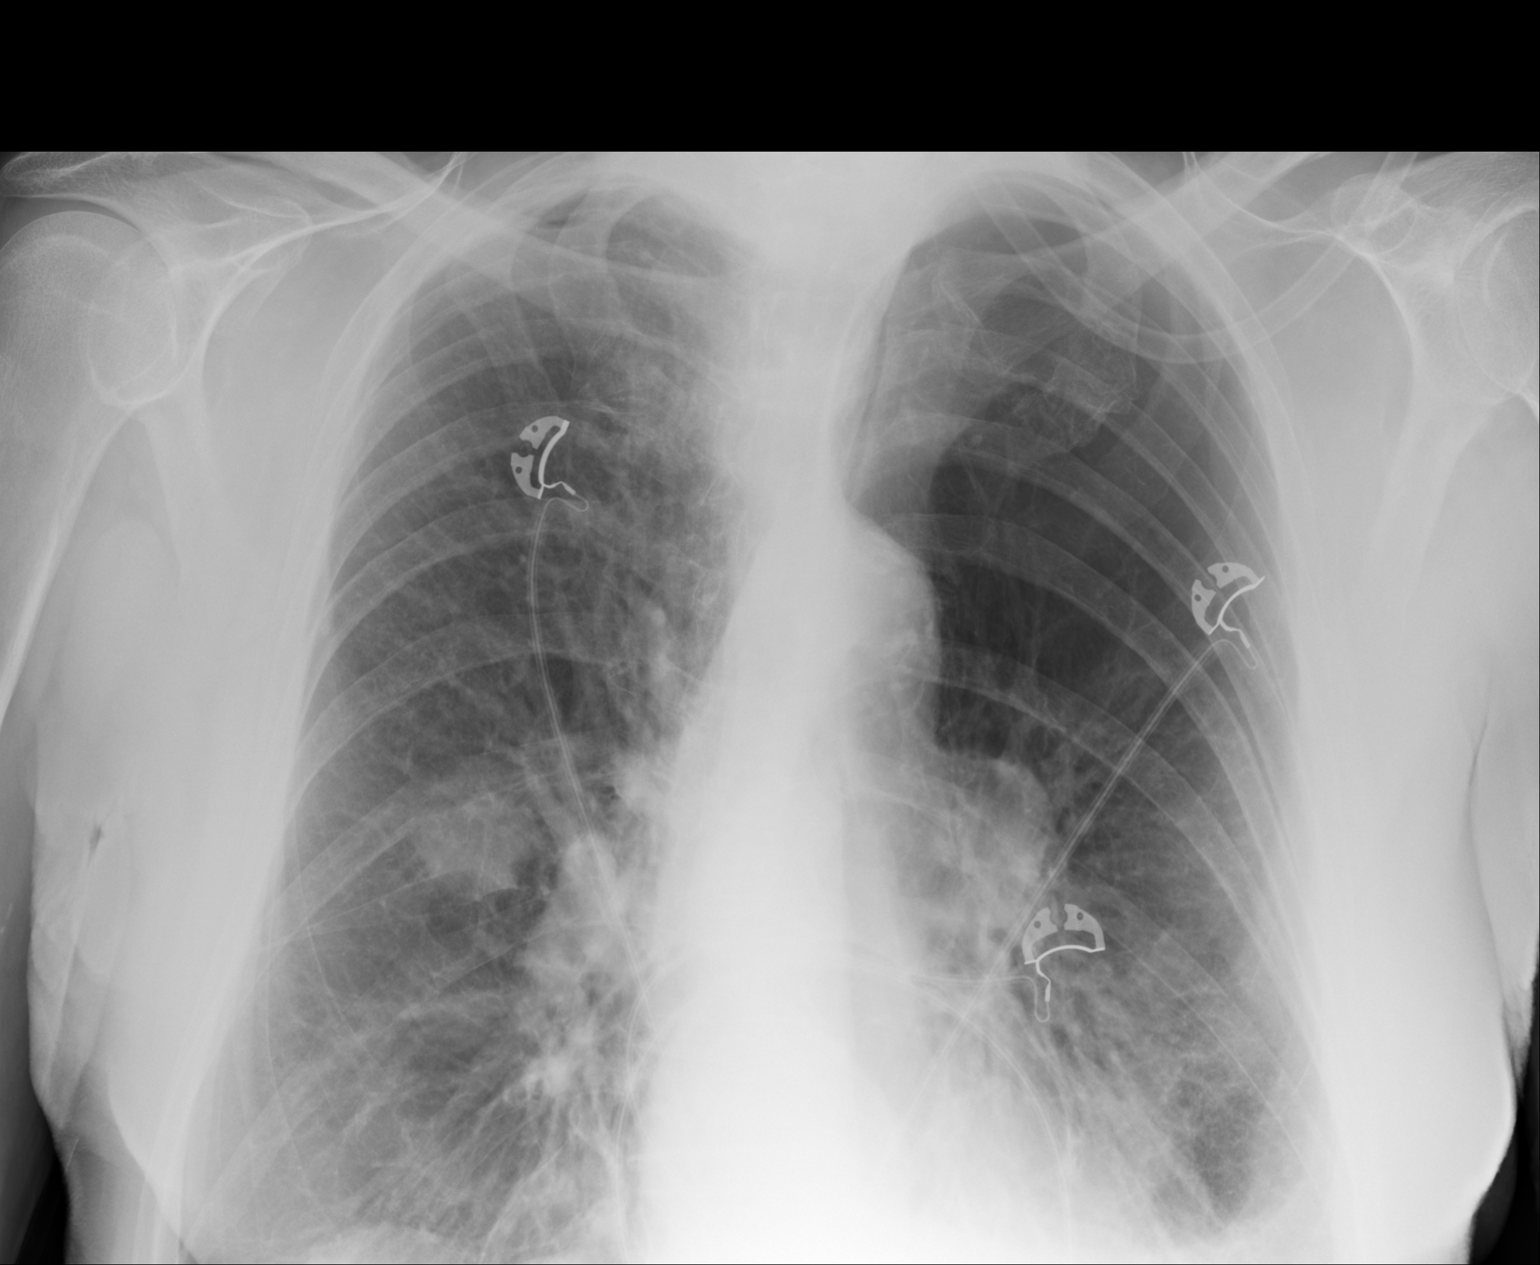
[im 2/2]
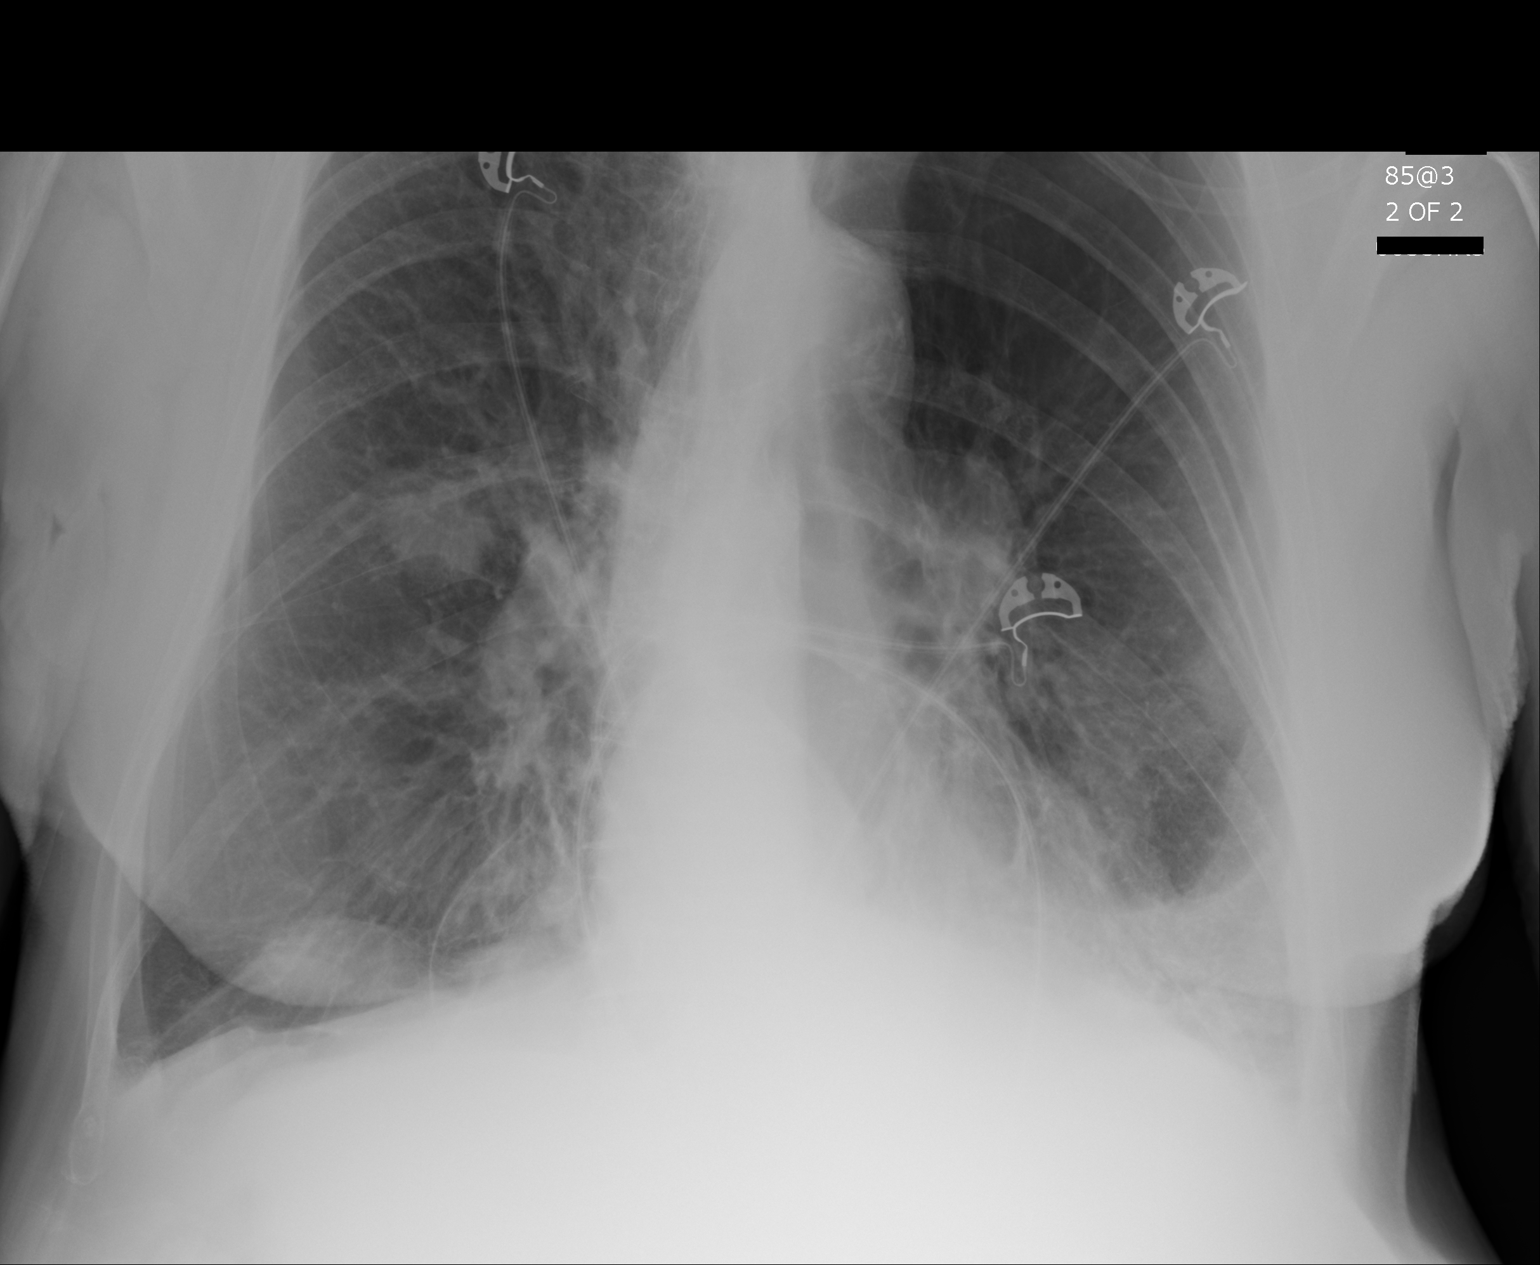

[2 of 2 positions shown; findings below may reference images not displayed]

FINDINGS: Lungs are adequately inflated with emphysematous disease most
prominent over the left mid to upper lung. There is opacification
within the left base likely a small effusion with associated
atelectasis, although cannot exclude infection. There is a 2.9 cm
nodular density lateral to the right hilum which is new
cardiomediastinal silhouette is within normal. There is calcified
plaque over the aortic arch. Remainder the exam is unchanged.
IMPRESSION: Left base opacification likely small effusion with associated
atelectasis although cannot exclude infection in the left base.

2.9 cm nodular opacity lateral to the right hilum. Recommend CT
chest for further evaluation.

Emphysematous disease.

## 2017-01-08 IMAGING — CT NM PET TUM IMG RESTAG (PS) SKULL BASE T - THIGH
10 series · 24 of 25 positions shown · non-contrast
Comparison: Chest CT 12/14/2014. Abdominal pelvic 12/21/2006. No
prior PET.

CLINICAL DATA: Initial treatment strategy for history of prostate
cancer with new hilar lymph node and spiculated lung mass..

EXAM:
NUCLEAR MEDICINE PET SKULL BASE TO THIGH
TECHNIQUE: 12.7 mCi F-18 FDG was injected intravenously. Full-ring PET imaging
was performed from the skull base to thigh after the radiotracer. CT
data was obtained and used for attenuation correction and anatomic
localization.
FASTING BLOOD GLUCOSE:  Value: 122 mg/dl

[Series 3: pet wb (ac) · axial · 5.0mm · 4.07mm/px · z∈[-1438,-571]mm · 3 of 290 slices shown]
[im 1/290]
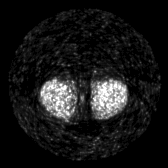
[im 145/290]
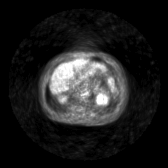
[im 290/290]
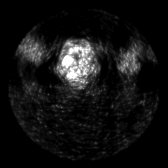

[Series 4: ct wb 5.0 b30f · axial · 5.0mm · 0.98mm/px · z∈[-1438,-571]mm · 3 of 290 slices shown]
[im 1/290]
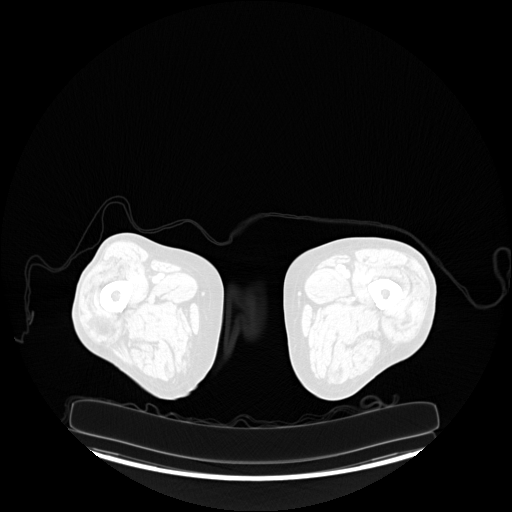
[im 145/290]
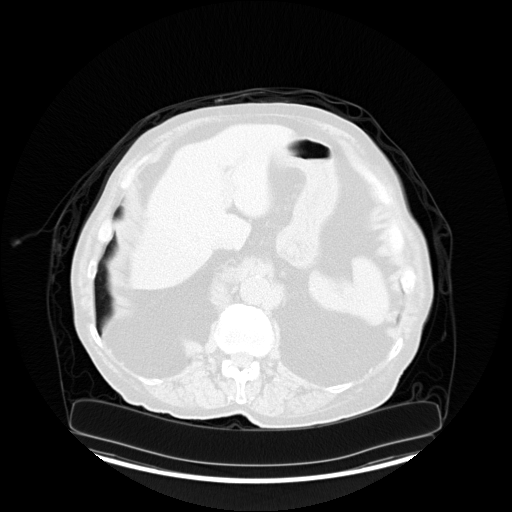
[im 290/290]
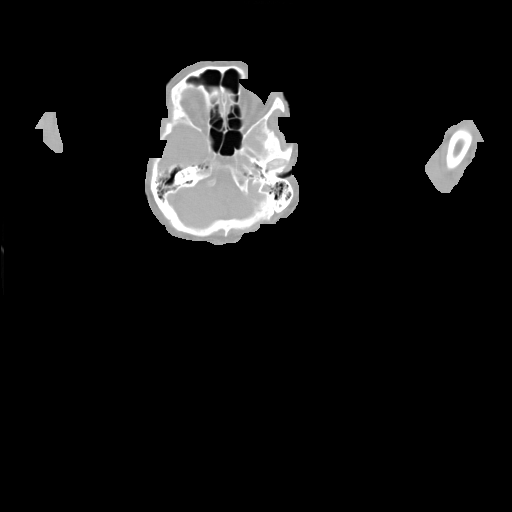

[Series 5: pet wb uncorrected (nac) · axial · 5.0mm · 4.07mm/px · z∈[-1438,-571]mm · 4 of 290 slices shown]
[im 1/290]
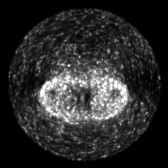
[im 97/290]
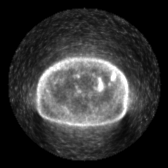
[im 193/290]
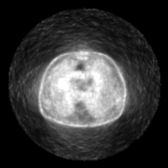
[im 290/290]
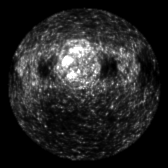

[Series 603: pet axial · 3 of 287 slices shown]
[im 1/287]
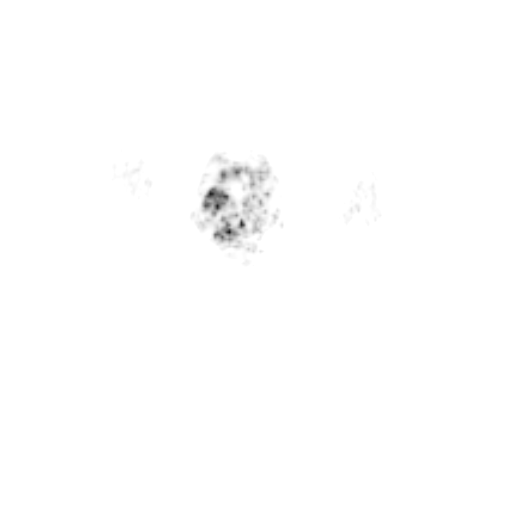
[im 96/287]
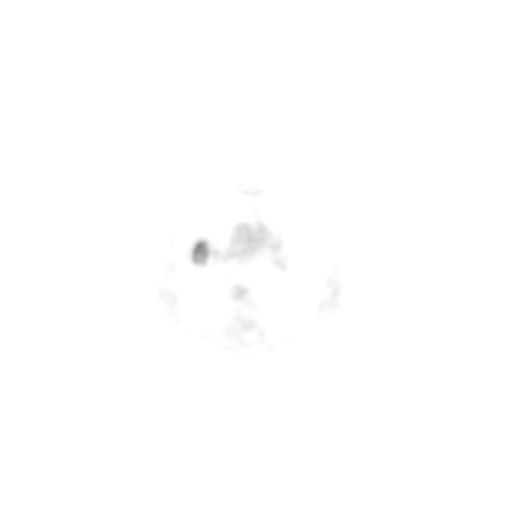
[im 287/287]
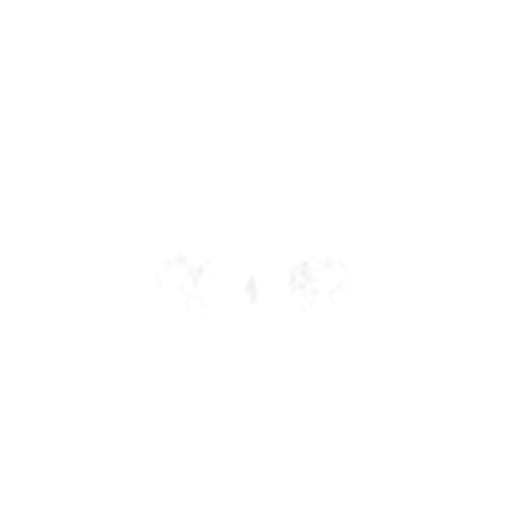

[Series 604: pet coronal · 1 of 114 slices shown]
[im 1/114]
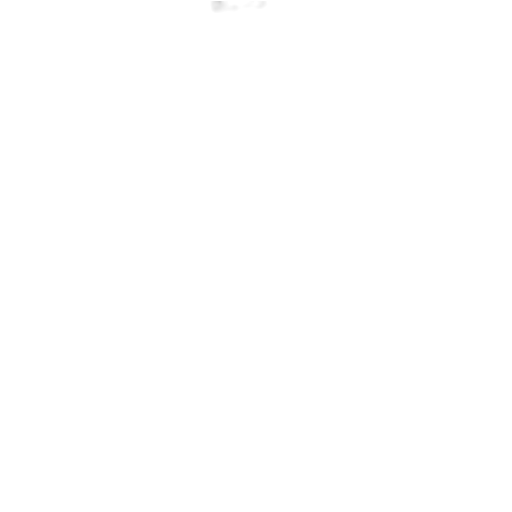

[Series 605: pet sagittal · 2 of 120 slices shown]
[im 1/120]
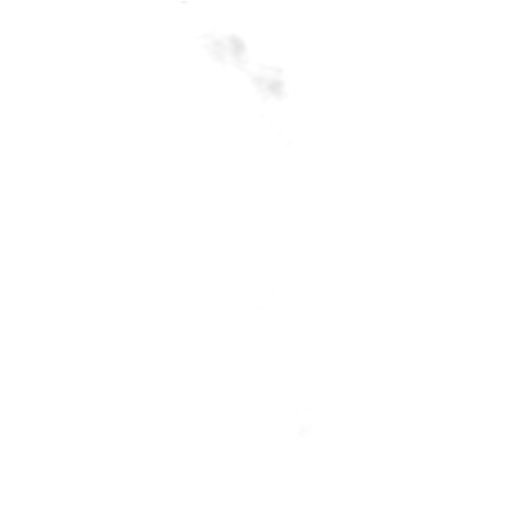
[im 120/120]
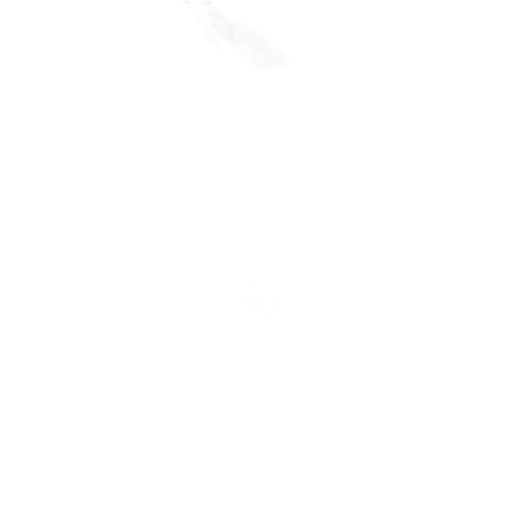

[Series 606: pet ct axial · 4 of 286 slices shown]
[im 1/286]
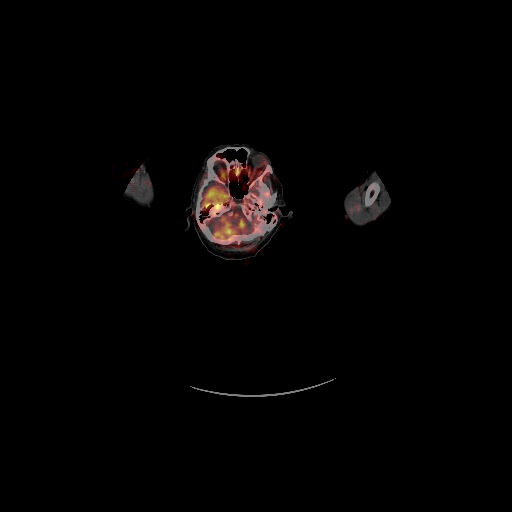
[im 96/286]
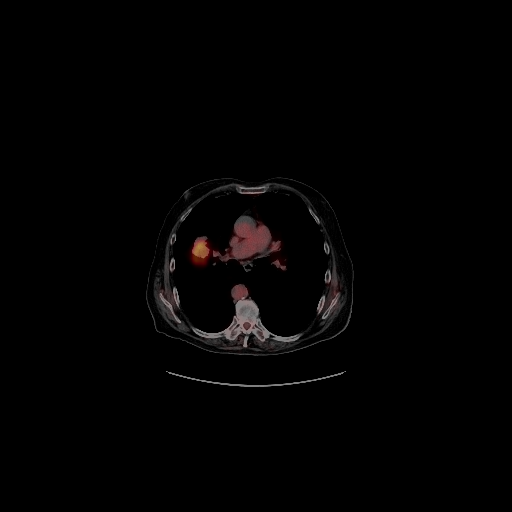
[im 191/286]
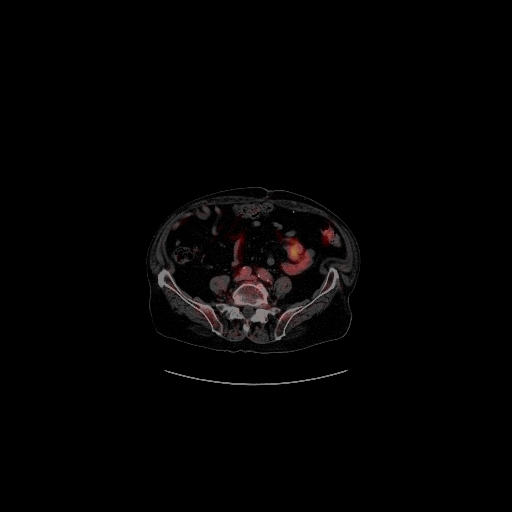
[im 286/286]
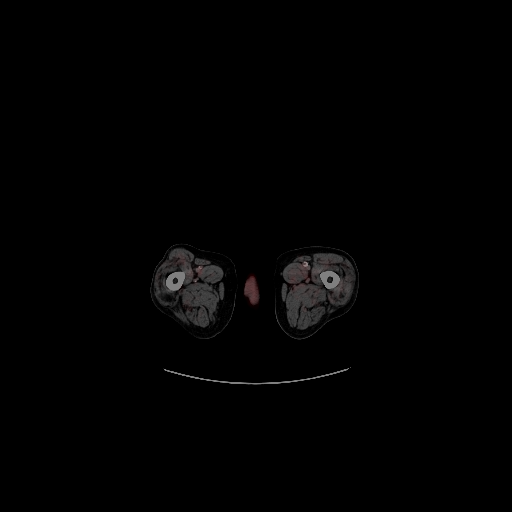

[Series 607: pet ct coronal · 1 of 112 slices shown]
[im 1/112]
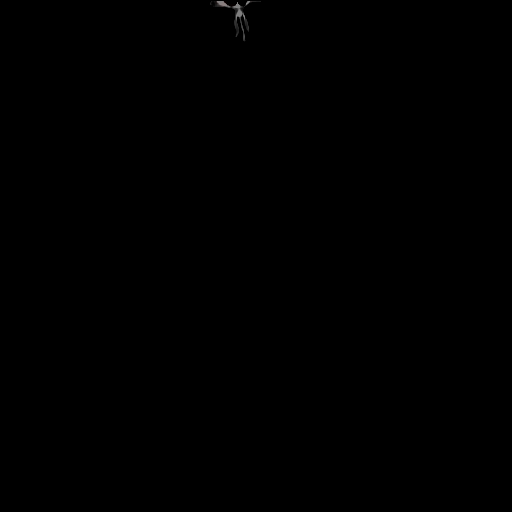

[Series 608: pet ct sagittal · 2 of 123 slices shown]
[im 1/123]
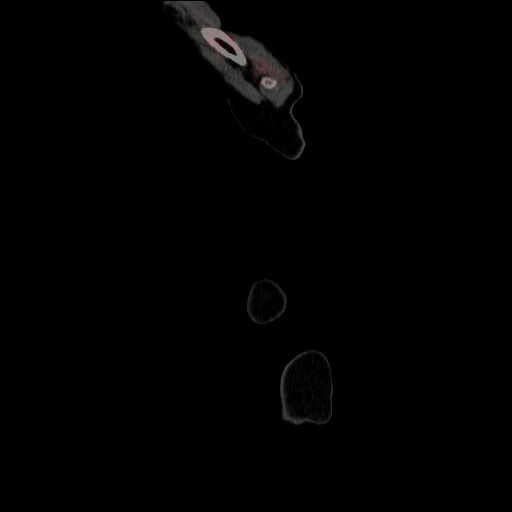
[im 123/123]
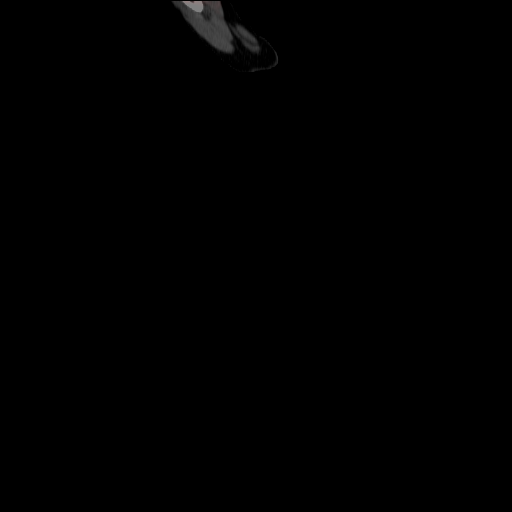

[Series 1044: results mm oncology reading · 3.0mm · 1.05mm/px · 1 of 2 slices shown]
[im 1/2]
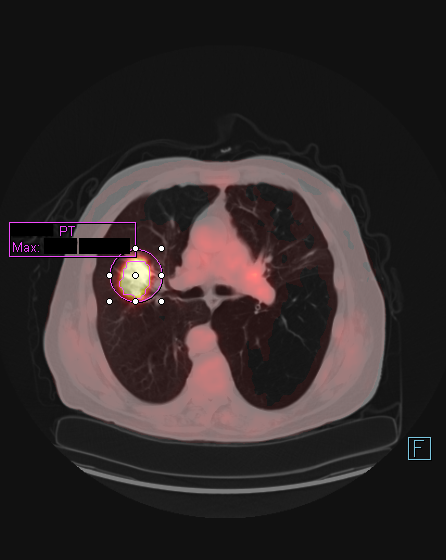

[24 of 25 positions shown; findings below may reference images not displayed]

FINDINGS: NECK

Hypermetabolism which corresponds to a lateral right level 2 jugular
node. This measures 7 mm and a S.U.V. max of 9.9 on image 28.

CHEST

Hypermetabolic spiculated inferior right upper lobe lung mass. 3.9 x
3.2 cm and a S.U.V. max of 13.2 on image 96 of series 4.

No thoracic nodal hypermetabolism ; 1.0 cm right hilar node on the
prior exam is not enlarged by size criteria.

Lingular volume loss and atelectasis. Medially, this is again
somewhat more nodular. Measures 2.1 cm and a S.U.V. max of the
on image 109.

ABDOMEN/PELVIS

No areas of abnormal hypermetabolism.

SKELETON

No abnormal marrow activity.

CT IMAGES PERFORMED FOR ATTENUATION CORRECTION

No cervical adenopathy. Bilateral carotid atherosclerosis. Chest
findings deferred to recent diagnostic CT. Mild cardiomegaly with
multivessel coronary artery atherosclerosis. A moderate hiatal
hernia. Advanced centrilobular emphysema. Bilateral renal cortical
thinning with low-density renal lesions which are likely cysts.
Normal adrenal glands. Multiple small gallstones. Transverse
duodenal 4 mm lipoma. 1.9 cm right common iliac artery ectasia. Left
common iliac artery ectasia at 1.5 cm. These are similar back to
7330. Pelvic node dissection. Prostatectomy. Mild pelvic floor
laxity. Left femoral neck bone island or enchondroma, chronic.
IMPRESSION: 1. Hypermetabolic inferior right upper lobe lung mass, consistent
with primary bronchogenic carcinoma.
2. No evidence of thoracic nodal hypermetabolism.
3. Lingular volume loss and atelectasis. More medially, this has a
more nodular, mildly hypermetabolic component. Although this is
indeterminate, a synchronous primary bronchogenic carcinoma cannot
be excluded.
4. Lateral right level 2 jugular chain hypermetabolic node,
suspicious for a somewhat atypical appearance of lung cancer
metastasis.
5. Incidental findings, as detailed above.

## 2017-01-09 IMAGING — CR DG CHEST 1V PORT
1 series · 1 of 1 positions shown · non-contrast
Comparison: None.

CLINICAL DATA: CT 12/14/2014

EXAM:
DG C-ARM 1-60 MIN - NRPT [facility]; PORTABLE CHEST - 1 VIEW

[ap]
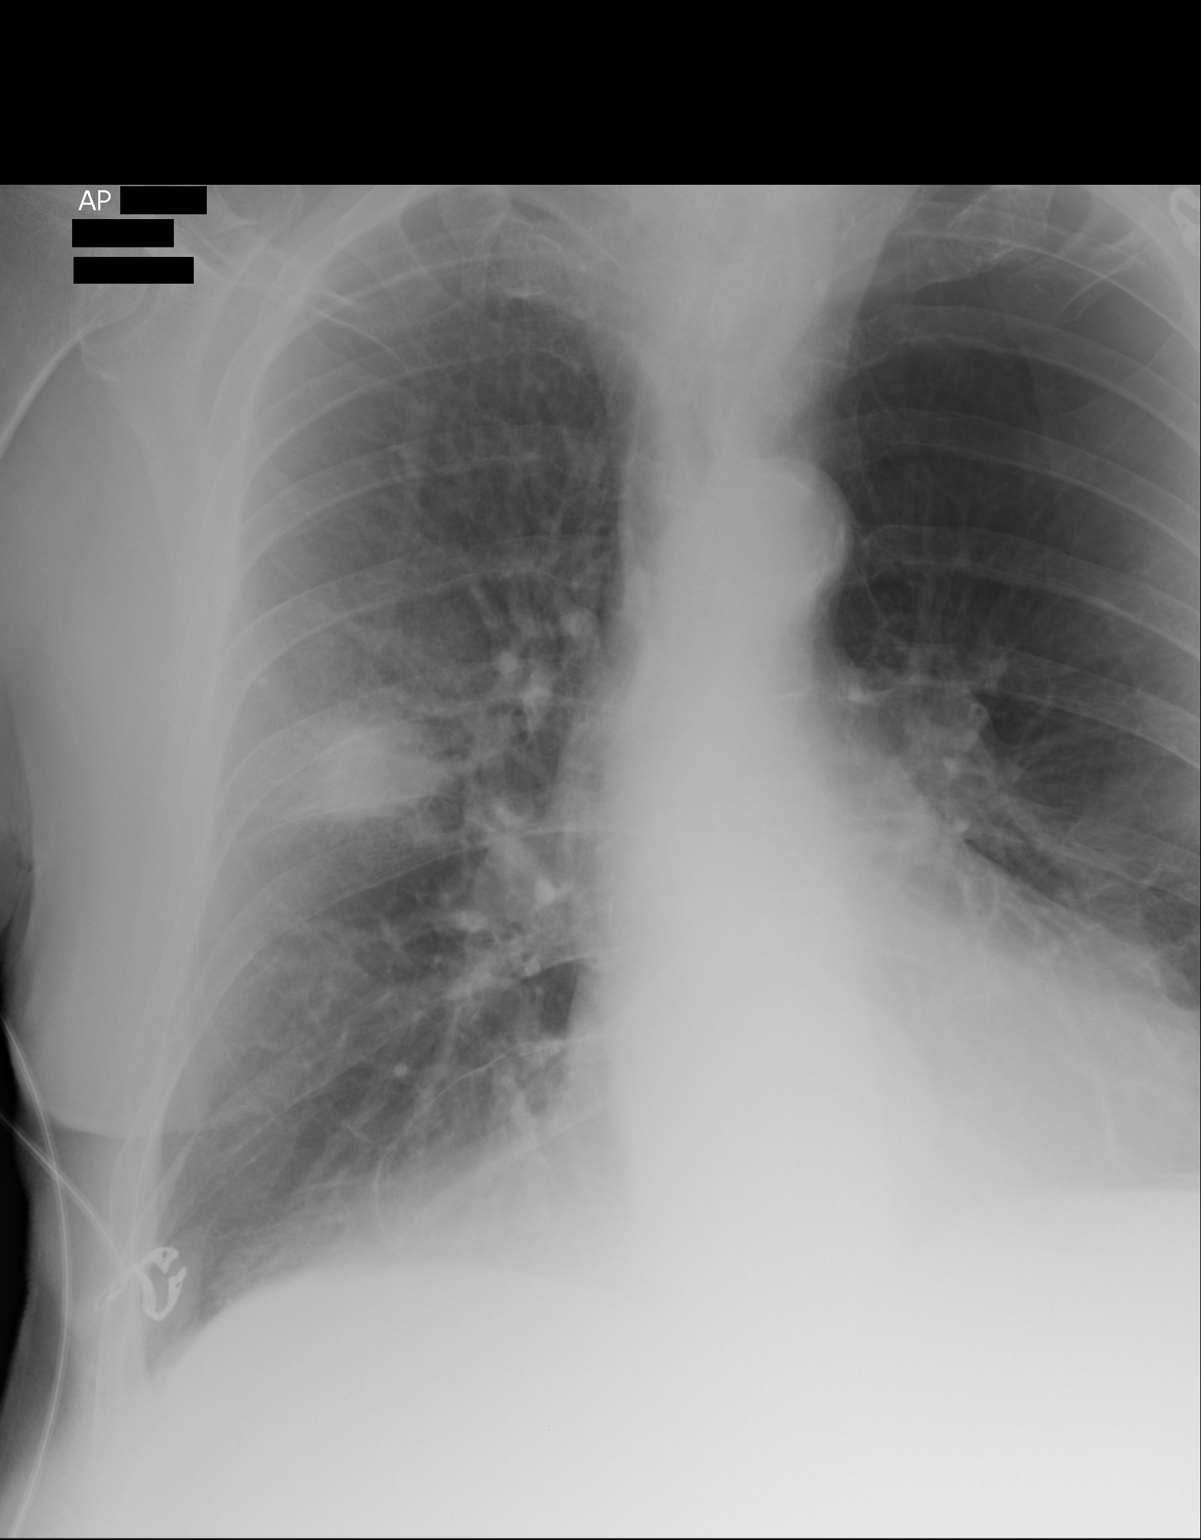

[1 of 1 positions shown; findings below may reference images not displayed]

FLUOROSCOPY TIME:  Fluoroscopy Time:  13 minutes 53 seconds

Number of Acquired Images:  1
FINDINGS: Right lung mass noted.  No evidence of pneumothorax post biopsy.
IMPRESSION: Right lung mass.  No evidence of pneumothorax post biopsy.
